# Patient Record
Sex: Female | Born: 1960 | Race: White | Hispanic: No | Marital: Single | Smoking: Current every day smoker
Health system: Southern US, Community
[De-identification: ages and names within clinical notes are randomized; demographics above are authoritative.]

## PROBLEM LIST (undated history)

## (undated) DIAGNOSIS — I10 Essential (primary) hypertension: Secondary | ICD-10-CM

## (undated) DIAGNOSIS — E785 Hyperlipidemia, unspecified: Secondary | ICD-10-CM

## (undated) DIAGNOSIS — F329 Major depressive disorder, single episode, unspecified: Secondary | ICD-10-CM

## (undated) DIAGNOSIS — F32A Depression, unspecified: Secondary | ICD-10-CM

## (undated) DIAGNOSIS — I639 Cerebral infarction, unspecified: Secondary | ICD-10-CM

## (undated) DIAGNOSIS — R011 Cardiac murmur, unspecified: Secondary | ICD-10-CM

## (undated) DIAGNOSIS — I219 Acute myocardial infarction, unspecified: Secondary | ICD-10-CM

## (undated) DIAGNOSIS — K219 Gastro-esophageal reflux disease without esophagitis: Secondary | ICD-10-CM

## (undated) HISTORY — DX: Essential (primary) hypertension: I10

## (undated) HISTORY — DX: Gastro-esophageal reflux disease without esophagitis: K21.9

## (undated) HISTORY — DX: Acute myocardial infarction, unspecified: I21.9

## (undated) HISTORY — DX: Major depressive disorder, single episode, unspecified: F32.9

## (undated) HISTORY — DX: Cerebral infarction, unspecified: I63.9

## (undated) HISTORY — PX: LAPAROSCOPIC CHOLECYSTECTOMY: SUR755

## (undated) HISTORY — DX: Depression, unspecified: F32.A

## (undated) HISTORY — DX: Cardiac murmur, unspecified: R01.1

## (undated) HISTORY — PX: CARDIAC SURGERY: SHX584

## (undated) HISTORY — DX: Hyperlipidemia, unspecified: E78.5

## (undated) HISTORY — PX: ABDOMINAL HYSTERECTOMY: SHX81

---

## 1997-11-18 ENCOUNTER — Other Ambulatory Visit: Admission: RE | Admit: 1997-11-18 | Discharge: 1997-11-18 | Payer: Self-pay | Admitting: Obstetrics & Gynecology

## 1999-07-27 ENCOUNTER — Encounter (INDEPENDENT_AMBULATORY_CARE_PROVIDER_SITE_OTHER): Payer: Self-pay

## 1999-07-27 ENCOUNTER — Encounter: Payer: Self-pay | Admitting: *Deleted

## 1999-07-27 ENCOUNTER — Inpatient Hospital Stay (HOSPITAL_COMMUNITY): Admission: AD | Admit: 1999-07-27 | Discharge: 1999-07-27 | Payer: Self-pay | Admitting: *Deleted

## 1999-07-30 ENCOUNTER — Encounter: Admission: RE | Admit: 1999-07-30 | Discharge: 1999-10-28 | Payer: Self-pay | Admitting: Obstetrics & Gynecology

## 1999-08-03 ENCOUNTER — Encounter (HOSPITAL_COMMUNITY): Admission: AD | Admit: 1999-08-03 | Discharge: 1999-11-01 | Payer: Self-pay | Admitting: *Deleted

## 1999-08-11 ENCOUNTER — Encounter: Admission: RE | Admit: 1999-08-11 | Discharge: 1999-08-11 | Payer: Self-pay | Admitting: Obstetrics & Gynecology

## 1999-08-14 ENCOUNTER — Inpatient Hospital Stay (HOSPITAL_COMMUNITY): Admission: AD | Admit: 1999-08-14 | Discharge: 1999-08-14 | Payer: Self-pay | Admitting: *Deleted

## 1999-08-18 ENCOUNTER — Encounter: Admission: RE | Admit: 1999-08-18 | Discharge: 1999-08-18 | Payer: Self-pay | Admitting: Obstetrics & Gynecology

## 1999-08-25 ENCOUNTER — Encounter: Admission: RE | Admit: 1999-08-25 | Discharge: 1999-08-25 | Payer: Self-pay | Admitting: Obstetrics & Gynecology

## 1999-09-08 ENCOUNTER — Encounter: Admission: RE | Admit: 1999-09-08 | Discharge: 1999-09-08 | Payer: Self-pay | Admitting: Obstetrics

## 1999-09-15 ENCOUNTER — Encounter: Admission: RE | Admit: 1999-09-15 | Discharge: 1999-09-15 | Payer: Self-pay | Admitting: Obstetrics & Gynecology

## 1999-09-21 ENCOUNTER — Ambulatory Visit (HOSPITAL_COMMUNITY): Admission: RE | Admit: 1999-09-21 | Discharge: 1999-09-21 | Payer: Self-pay | Admitting: Obstetrics

## 1999-09-22 ENCOUNTER — Encounter: Admission: RE | Admit: 1999-09-22 | Discharge: 1999-09-22 | Payer: Self-pay | Admitting: Obstetrics & Gynecology

## 1999-09-29 ENCOUNTER — Ambulatory Visit (HOSPITAL_COMMUNITY): Admission: RE | Admit: 1999-09-29 | Discharge: 1999-09-29 | Payer: Self-pay | Admitting: Internal Medicine

## 1999-09-29 ENCOUNTER — Encounter: Admission: RE | Admit: 1999-09-29 | Discharge: 1999-09-29 | Payer: Self-pay | Admitting: Obstetrics & Gynecology

## 1999-10-04 ENCOUNTER — Encounter: Payer: Self-pay | Admitting: *Deleted

## 1999-10-06 ENCOUNTER — Encounter: Admission: RE | Admit: 1999-10-06 | Discharge: 1999-10-06 | Payer: Self-pay | Admitting: Obstetrics & Gynecology

## 1999-10-13 ENCOUNTER — Encounter: Admission: RE | Admit: 1999-10-13 | Discharge: 1999-10-13 | Payer: Self-pay | Admitting: Obstetrics & Gynecology

## 1999-10-20 ENCOUNTER — Encounter: Admission: RE | Admit: 1999-10-20 | Discharge: 1999-10-20 | Payer: Self-pay | Admitting: Obstetrics & Gynecology

## 1999-10-27 ENCOUNTER — Encounter: Admission: RE | Admit: 1999-10-27 | Discharge: 1999-10-27 | Payer: Self-pay | Admitting: Obstetrics & Gynecology

## 1999-10-27 ENCOUNTER — Observation Stay (HOSPITAL_COMMUNITY): Admission: AD | Admit: 1999-10-27 | Discharge: 1999-10-28 | Payer: Self-pay | Admitting: Obstetrics

## 1999-10-28 ENCOUNTER — Encounter: Payer: Self-pay | Admitting: Obstetrics

## 1999-11-03 ENCOUNTER — Inpatient Hospital Stay (HOSPITAL_COMMUNITY): Admission: AD | Admit: 1999-11-03 | Discharge: 1999-11-03 | Payer: Self-pay | Admitting: Obstetrics

## 1999-11-03 ENCOUNTER — Encounter: Admission: RE | Admit: 1999-11-03 | Discharge: 1999-11-03 | Payer: Self-pay | Admitting: Obstetrics & Gynecology

## 1999-11-05 ENCOUNTER — Encounter (HOSPITAL_COMMUNITY): Admission: RE | Admit: 1999-11-05 | Discharge: 1999-12-10 | Payer: Self-pay | Admitting: Obstetrics

## 1999-11-10 ENCOUNTER — Encounter: Admission: RE | Admit: 1999-11-10 | Discharge: 1999-11-10 | Payer: Self-pay | Admitting: Obstetrics & Gynecology

## 1999-11-14 ENCOUNTER — Inpatient Hospital Stay (HOSPITAL_COMMUNITY): Admission: AD | Admit: 1999-11-14 | Discharge: 1999-11-14 | Payer: Self-pay | Admitting: Obstetrics

## 1999-11-17 ENCOUNTER — Encounter: Admission: RE | Admit: 1999-11-17 | Discharge: 1999-11-17 | Payer: Self-pay | Admitting: Obstetrics & Gynecology

## 1999-11-24 ENCOUNTER — Encounter: Admission: RE | Admit: 1999-11-24 | Discharge: 1999-11-24 | Payer: Self-pay | Admitting: Obstetrics & Gynecology

## 1999-12-01 ENCOUNTER — Inpatient Hospital Stay (HOSPITAL_COMMUNITY): Admission: AD | Admit: 1999-12-01 | Discharge: 1999-12-03 | Payer: Self-pay | Admitting: Obstetrics

## 1999-12-01 ENCOUNTER — Encounter: Admission: RE | Admit: 1999-12-01 | Discharge: 1999-12-01 | Payer: Self-pay | Admitting: Obstetrics & Gynecology

## 1999-12-01 ENCOUNTER — Encounter: Payer: Self-pay | Admitting: Obstetrics

## 1999-12-02 ENCOUNTER — Encounter: Payer: Self-pay | Admitting: Obstetrics

## 1999-12-03 ENCOUNTER — Encounter: Payer: Self-pay | Admitting: Obstetrics

## 1999-12-08 ENCOUNTER — Inpatient Hospital Stay (HOSPITAL_COMMUNITY): Admission: AD | Admit: 1999-12-08 | Discharge: 1999-12-15 | Payer: Self-pay | Admitting: Obstetrics

## 1999-12-08 ENCOUNTER — Encounter (INDEPENDENT_AMBULATORY_CARE_PROVIDER_SITE_OTHER): Payer: Self-pay

## 1999-12-08 ENCOUNTER — Encounter: Admission: RE | Admit: 1999-12-08 | Discharge: 1999-12-08 | Payer: Self-pay | Admitting: Obstetrics & Gynecology

## 1999-12-13 ENCOUNTER — Encounter: Payer: Self-pay | Admitting: Obstetrics

## 1999-12-14 ENCOUNTER — Encounter: Payer: Self-pay | Admitting: Obstetrics & Gynecology

## 2000-01-18 ENCOUNTER — Emergency Department (HOSPITAL_COMMUNITY): Admission: EM | Admit: 2000-01-18 | Discharge: 2000-01-18 | Payer: Self-pay | Admitting: Emergency Medicine

## 2000-03-10 ENCOUNTER — Encounter: Admission: RE | Admit: 2000-03-10 | Discharge: 2000-03-10 | Payer: Self-pay | Admitting: Internal Medicine

## 2000-05-25 ENCOUNTER — Emergency Department (HOSPITAL_COMMUNITY): Admission: EM | Admit: 2000-05-25 | Discharge: 2000-05-25 | Payer: Self-pay | Admitting: Emergency Medicine

## 2000-05-26 ENCOUNTER — Inpatient Hospital Stay (HOSPITAL_COMMUNITY): Admission: EM | Admit: 2000-05-26 | Discharge: 2000-05-29 | Payer: Self-pay | Admitting: Emergency Medicine

## 2000-05-26 ENCOUNTER — Encounter: Payer: Self-pay | Admitting: Emergency Medicine

## 2000-11-05 ENCOUNTER — Encounter: Payer: Self-pay | Admitting: Emergency Medicine

## 2000-11-06 ENCOUNTER — Encounter: Payer: Self-pay | Admitting: Gastroenterology

## 2000-11-06 ENCOUNTER — Encounter (INDEPENDENT_AMBULATORY_CARE_PROVIDER_SITE_OTHER): Payer: Self-pay

## 2000-11-06 ENCOUNTER — Inpatient Hospital Stay (HOSPITAL_COMMUNITY): Admission: EM | Admit: 2000-11-06 | Discharge: 2000-11-08 | Payer: Self-pay | Admitting: Emergency Medicine

## 2001-02-13 ENCOUNTER — Emergency Department (HOSPITAL_COMMUNITY): Admission: EM | Admit: 2001-02-13 | Discharge: 2001-02-14 | Payer: Self-pay | Admitting: *Deleted

## 2001-05-11 ENCOUNTER — Encounter: Payer: Self-pay | Admitting: *Deleted

## 2001-05-11 ENCOUNTER — Encounter: Admission: RE | Admit: 2001-05-11 | Discharge: 2001-05-11 | Payer: Self-pay | Admitting: *Deleted

## 2001-07-14 ENCOUNTER — Emergency Department (HOSPITAL_COMMUNITY): Admission: EM | Admit: 2001-07-14 | Discharge: 2001-07-14 | Payer: Self-pay | Admitting: Emergency Medicine

## 2001-07-18 ENCOUNTER — Ambulatory Visit (HOSPITAL_COMMUNITY): Admission: RE | Admit: 2001-07-18 | Discharge: 2001-07-18 | Payer: Self-pay | Admitting: Family Medicine

## 2001-07-18 ENCOUNTER — Encounter: Payer: Self-pay | Admitting: Family Medicine

## 2002-04-05 ENCOUNTER — Encounter: Admission: RE | Admit: 2002-04-05 | Discharge: 2002-04-05 | Payer: Self-pay | Admitting: Internal Medicine

## 2002-05-06 ENCOUNTER — Encounter: Admission: RE | Admit: 2002-05-06 | Discharge: 2002-05-06 | Payer: Self-pay | Admitting: Internal Medicine

## 2002-05-27 ENCOUNTER — Encounter: Admission: RE | Admit: 2002-05-27 | Discharge: 2002-05-27 | Payer: Self-pay | Admitting: Internal Medicine

## 2002-05-29 ENCOUNTER — Encounter: Admission: RE | Admit: 2002-05-29 | Discharge: 2002-05-29 | Payer: Self-pay | Admitting: Internal Medicine

## 2002-05-31 ENCOUNTER — Encounter: Admission: RE | Admit: 2002-05-31 | Discharge: 2002-05-31 | Payer: Self-pay | Admitting: Internal Medicine

## 2002-05-31 ENCOUNTER — Inpatient Hospital Stay (HOSPITAL_COMMUNITY): Admission: AD | Admit: 2002-05-31 | Discharge: 2002-06-05 | Payer: Self-pay | Admitting: Internal Medicine

## 2002-06-28 ENCOUNTER — Encounter: Admission: RE | Admit: 2002-06-28 | Discharge: 2002-06-28 | Payer: Self-pay | Admitting: Internal Medicine

## 2002-07-25 ENCOUNTER — Encounter: Admission: RE | Admit: 2002-07-25 | Discharge: 2002-07-25 | Payer: Self-pay | Admitting: Internal Medicine

## 2002-08-20 ENCOUNTER — Encounter: Admission: RE | Admit: 2002-08-20 | Discharge: 2002-08-20 | Payer: Self-pay | Admitting: Internal Medicine

## 2002-09-18 ENCOUNTER — Encounter: Admission: RE | Admit: 2002-09-18 | Discharge: 2002-09-18 | Payer: Self-pay | Admitting: Internal Medicine

## 2002-10-02 ENCOUNTER — Ambulatory Visit (HOSPITAL_COMMUNITY): Admission: RE | Admit: 2002-10-02 | Discharge: 2002-10-02 | Payer: Self-pay | Admitting: Internal Medicine

## 2002-10-28 ENCOUNTER — Ambulatory Visit (HOSPITAL_COMMUNITY): Admission: RE | Admit: 2002-10-28 | Discharge: 2002-10-28 | Payer: Self-pay | Admitting: Internal Medicine

## 2002-10-28 ENCOUNTER — Encounter: Payer: Self-pay | Admitting: Internal Medicine

## 2002-11-07 ENCOUNTER — Encounter: Admission: RE | Admit: 2002-11-07 | Discharge: 2002-11-07 | Payer: Self-pay | Admitting: Internal Medicine

## 2002-11-14 ENCOUNTER — Encounter: Admission: RE | Admit: 2002-11-14 | Discharge: 2002-11-14 | Payer: Self-pay | Admitting: Internal Medicine

## 2002-11-15 ENCOUNTER — Inpatient Hospital Stay (HOSPITAL_COMMUNITY): Admission: AD | Admit: 2002-11-15 | Discharge: 2002-11-17 | Payer: Self-pay | Admitting: Infectious Diseases

## 2002-11-21 ENCOUNTER — Encounter: Admission: RE | Admit: 2002-11-21 | Discharge: 2002-11-21 | Payer: Self-pay | Admitting: Internal Medicine

## 2002-12-03 ENCOUNTER — Encounter: Admission: RE | Admit: 2002-12-03 | Discharge: 2002-12-03 | Payer: Self-pay | Admitting: Internal Medicine

## 2002-12-05 ENCOUNTER — Other Ambulatory Visit: Admission: RE | Admit: 2002-12-05 | Discharge: 2002-12-05 | Payer: Self-pay | Admitting: Obstetrics and Gynecology

## 2002-12-05 ENCOUNTER — Encounter: Admission: RE | Admit: 2002-12-05 | Discharge: 2002-12-05 | Payer: Self-pay | Admitting: Obstetrics and Gynecology

## 2002-12-10 ENCOUNTER — Encounter: Admission: RE | Admit: 2002-12-10 | Discharge: 2002-12-10 | Payer: Self-pay | Admitting: Internal Medicine

## 2002-12-19 ENCOUNTER — Encounter: Admission: RE | Admit: 2002-12-19 | Discharge: 2002-12-19 | Payer: Self-pay | Admitting: Internal Medicine

## 2002-12-27 ENCOUNTER — Ambulatory Visit (HOSPITAL_COMMUNITY): Admission: RE | Admit: 2002-12-27 | Discharge: 2002-12-27 | Payer: Self-pay

## 2003-01-17 ENCOUNTER — Encounter: Payer: Self-pay | Admitting: Internal Medicine

## 2003-01-17 ENCOUNTER — Ambulatory Visit (HOSPITAL_COMMUNITY): Admission: RE | Admit: 2003-01-17 | Discharge: 2003-01-17 | Payer: Self-pay | Admitting: Internal Medicine

## 2003-01-24 ENCOUNTER — Encounter: Admission: RE | Admit: 2003-01-24 | Discharge: 2003-01-24 | Payer: Self-pay | Admitting: Internal Medicine

## 2003-02-11 ENCOUNTER — Emergency Department (HOSPITAL_COMMUNITY): Admission: EM | Admit: 2003-02-11 | Discharge: 2003-02-11 | Payer: Self-pay | Admitting: Emergency Medicine

## 2003-02-20 ENCOUNTER — Encounter: Admission: RE | Admit: 2003-02-20 | Discharge: 2003-02-20 | Payer: Self-pay | Admitting: Internal Medicine

## 2003-03-05 ENCOUNTER — Encounter: Admission: RE | Admit: 2003-03-05 | Discharge: 2003-03-05 | Payer: Self-pay | Admitting: Internal Medicine

## 2003-03-07 ENCOUNTER — Encounter: Payer: Self-pay | Admitting: Internal Medicine

## 2003-03-07 ENCOUNTER — Ambulatory Visit (HOSPITAL_COMMUNITY): Admission: RE | Admit: 2003-03-07 | Discharge: 2003-03-07 | Payer: Self-pay | Admitting: Internal Medicine

## 2003-03-10 ENCOUNTER — Encounter: Admission: RE | Admit: 2003-03-10 | Discharge: 2003-03-10 | Payer: Self-pay | Admitting: Internal Medicine

## 2003-03-18 ENCOUNTER — Encounter: Admission: RE | Admit: 2003-03-18 | Discharge: 2003-03-18 | Payer: Self-pay | Admitting: Internal Medicine

## 2003-04-17 ENCOUNTER — Encounter: Admission: RE | Admit: 2003-04-17 | Discharge: 2003-04-17 | Payer: Self-pay | Admitting: Internal Medicine

## 2003-05-06 ENCOUNTER — Encounter: Admission: RE | Admit: 2003-05-06 | Discharge: 2003-05-06 | Payer: Self-pay | Admitting: Internal Medicine

## 2003-05-23 ENCOUNTER — Emergency Department (HOSPITAL_COMMUNITY): Admission: EM | Admit: 2003-05-23 | Discharge: 2003-05-23 | Payer: Self-pay | Admitting: Emergency Medicine

## 2003-05-27 ENCOUNTER — Encounter: Admission: RE | Admit: 2003-05-27 | Discharge: 2003-05-27 | Payer: Self-pay | Admitting: Internal Medicine

## 2003-05-28 ENCOUNTER — Ambulatory Visit: Admission: RE | Admit: 2003-05-28 | Discharge: 2003-05-28 | Payer: Self-pay | Admitting: Internal Medicine

## 2003-05-28 ENCOUNTER — Encounter: Payer: Self-pay | Admitting: Internal Medicine

## 2003-05-28 ENCOUNTER — Encounter: Admission: RE | Admit: 2003-05-28 | Discharge: 2003-05-28 | Payer: Self-pay | Admitting: Internal Medicine

## 2003-05-29 ENCOUNTER — Encounter: Payer: Self-pay | Admitting: Internal Medicine

## 2003-05-29 ENCOUNTER — Inpatient Hospital Stay (HOSPITAL_COMMUNITY): Admission: AD | Admit: 2003-05-29 | Discharge: 2003-05-31 | Payer: Self-pay | Admitting: Internal Medicine

## 2003-05-29 ENCOUNTER — Encounter: Admission: RE | Admit: 2003-05-29 | Discharge: 2003-05-29 | Payer: Self-pay | Admitting: Internal Medicine

## 2003-06-11 ENCOUNTER — Encounter: Admission: RE | Admit: 2003-06-11 | Discharge: 2003-06-11 | Payer: Self-pay | Admitting: Internal Medicine

## 2003-06-17 ENCOUNTER — Encounter (INDEPENDENT_AMBULATORY_CARE_PROVIDER_SITE_OTHER): Payer: Self-pay

## 2003-06-17 ENCOUNTER — Other Ambulatory Visit: Admission: RE | Admit: 2003-06-17 | Discharge: 2003-06-17 | Payer: Self-pay | Admitting: Obstetrics & Gynecology

## 2003-06-17 ENCOUNTER — Encounter: Admission: RE | Admit: 2003-06-17 | Discharge: 2003-06-17 | Payer: Self-pay | Admitting: Obstetrics and Gynecology

## 2003-06-24 ENCOUNTER — Encounter: Admission: RE | Admit: 2003-06-24 | Discharge: 2003-06-24 | Payer: Self-pay | Admitting: Obstetrics and Gynecology

## 2003-07-01 ENCOUNTER — Ambulatory Visit (HOSPITAL_COMMUNITY): Admission: RE | Admit: 2003-07-01 | Discharge: 2003-07-01 | Payer: Self-pay | Admitting: *Deleted

## 2003-07-01 ENCOUNTER — Encounter: Payer: Self-pay | Admitting: *Deleted

## 2003-07-01 ENCOUNTER — Encounter: Admission: RE | Admit: 2003-07-01 | Discharge: 2003-07-01 | Payer: Self-pay | Admitting: Obstetrics and Gynecology

## 2003-07-29 ENCOUNTER — Encounter: Admission: RE | Admit: 2003-07-29 | Discharge: 2003-07-29 | Payer: Self-pay | Admitting: Obstetrics and Gynecology

## 2003-08-18 ENCOUNTER — Encounter: Admission: RE | Admit: 2003-08-18 | Discharge: 2003-08-18 | Payer: Self-pay | Admitting: Internal Medicine

## 2003-08-28 ENCOUNTER — Ambulatory Visit (HOSPITAL_COMMUNITY): Admission: RE | Admit: 2003-08-28 | Discharge: 2003-08-28 | Payer: Self-pay | Admitting: Obstetrics and Gynecology

## 2003-09-16 ENCOUNTER — Encounter: Admission: RE | Admit: 2003-09-16 | Discharge: 2003-09-16 | Payer: Self-pay | Admitting: Internal Medicine

## 2003-10-01 ENCOUNTER — Ambulatory Visit: Admission: RE | Admit: 2003-10-01 | Discharge: 2003-10-01 | Payer: Self-pay | Admitting: Gynecologic Oncology

## 2003-10-13 ENCOUNTER — Encounter: Admission: RE | Admit: 2003-10-13 | Discharge: 2003-10-13 | Payer: Self-pay | Admitting: Internal Medicine

## 2003-10-15 ENCOUNTER — Encounter (INDEPENDENT_AMBULATORY_CARE_PROVIDER_SITE_OTHER): Payer: Self-pay | Admitting: Specialist

## 2003-10-15 ENCOUNTER — Inpatient Hospital Stay (HOSPITAL_COMMUNITY): Admission: RE | Admit: 2003-10-15 | Discharge: 2003-10-19 | Payer: Self-pay | Admitting: Obstetrics & Gynecology

## 2003-10-21 ENCOUNTER — Encounter: Admission: RE | Admit: 2003-10-21 | Discharge: 2003-10-21 | Payer: Self-pay | Admitting: Obstetrics and Gynecology

## 2003-11-07 ENCOUNTER — Encounter: Admission: RE | Admit: 2003-11-07 | Discharge: 2003-11-07 | Payer: Self-pay | Admitting: Internal Medicine

## 2003-11-18 ENCOUNTER — Encounter: Admission: RE | Admit: 2003-11-18 | Discharge: 2003-11-18 | Payer: Self-pay | Admitting: Obstetrics and Gynecology

## 2004-01-26 ENCOUNTER — Encounter: Admission: RE | Admit: 2004-01-26 | Discharge: 2004-01-26 | Payer: Self-pay | Admitting: Internal Medicine

## 2004-02-19 ENCOUNTER — Encounter: Admission: RE | Admit: 2004-02-19 | Discharge: 2004-02-19 | Payer: Self-pay | Admitting: Internal Medicine

## 2004-02-24 ENCOUNTER — Encounter: Admission: RE | Admit: 2004-02-24 | Discharge: 2004-02-24 | Payer: Self-pay | Admitting: Obstetrics and Gynecology

## 2004-03-23 ENCOUNTER — Encounter: Admission: RE | Admit: 2004-03-23 | Discharge: 2004-03-23 | Payer: Self-pay | Admitting: Internal Medicine

## 2004-04-05 ENCOUNTER — Encounter: Admission: RE | Admit: 2004-04-05 | Discharge: 2004-04-05 | Payer: Self-pay | Admitting: Internal Medicine

## 2004-04-27 ENCOUNTER — Encounter: Admission: RE | Admit: 2004-04-27 | Discharge: 2004-04-27 | Payer: Self-pay | Admitting: Internal Medicine

## 2004-05-31 ENCOUNTER — Ambulatory Visit: Payer: Self-pay | Admitting: Internal Medicine

## 2004-06-24 ENCOUNTER — Ambulatory Visit: Payer: Self-pay | Admitting: Internal Medicine

## 2004-08-03 ENCOUNTER — Ambulatory Visit: Payer: Self-pay | Admitting: Obstetrics & Gynecology

## 2004-08-10 ENCOUNTER — Encounter: Admission: RE | Admit: 2004-08-10 | Discharge: 2004-08-10 | Payer: Self-pay | Admitting: Obstetrics & Gynecology

## 2004-08-10 ENCOUNTER — Ambulatory Visit: Payer: Self-pay | Admitting: Internal Medicine

## 2004-12-01 ENCOUNTER — Encounter: Admission: RE | Admit: 2004-12-01 | Discharge: 2004-12-01 | Payer: Self-pay | Admitting: Family Medicine

## 2004-12-31 ENCOUNTER — Encounter: Admission: RE | Admit: 2004-12-31 | Discharge: 2004-12-31 | Payer: Self-pay | Admitting: Family Medicine

## 2005-05-10 ENCOUNTER — Ambulatory Visit: Payer: Self-pay | Admitting: *Deleted

## 2005-05-31 ENCOUNTER — Ambulatory Visit: Payer: Self-pay | Admitting: Obstetrics & Gynecology

## 2005-05-31 ENCOUNTER — Encounter: Admission: RE | Admit: 2005-05-31 | Discharge: 2005-05-31 | Payer: Self-pay

## 2005-07-04 ENCOUNTER — Encounter: Admission: RE | Admit: 2005-07-04 | Discharge: 2005-07-04 | Payer: Self-pay

## 2005-07-05 ENCOUNTER — Ambulatory Visit (HOSPITAL_BASED_OUTPATIENT_CLINIC_OR_DEPARTMENT_OTHER): Admission: RE | Admit: 2005-07-05 | Discharge: 2005-07-05 | Payer: Self-pay

## 2005-07-05 ENCOUNTER — Encounter (INDEPENDENT_AMBULATORY_CARE_PROVIDER_SITE_OTHER): Payer: Self-pay | Admitting: Specialist

## 2005-07-05 ENCOUNTER — Ambulatory Visit (HOSPITAL_COMMUNITY): Admission: RE | Admit: 2005-07-05 | Discharge: 2005-07-05 | Payer: Self-pay

## 2005-08-11 ENCOUNTER — Ambulatory Visit (HOSPITAL_COMMUNITY): Admission: RE | Admit: 2005-08-11 | Discharge: 2005-08-12 | Payer: Self-pay | Admitting: Cardiovascular Disease

## 2005-11-24 ENCOUNTER — Ambulatory Visit (HOSPITAL_COMMUNITY): Admission: RE | Admit: 2005-11-24 | Discharge: 2005-11-25 | Payer: Self-pay | Admitting: Physician Assistant

## 2005-11-30 ENCOUNTER — Encounter
Admission: RE | Admit: 2005-11-30 | Discharge: 2006-02-28 | Payer: Self-pay | Admitting: Physical Medicine & Rehabilitation

## 2005-11-30 ENCOUNTER — Ambulatory Visit: Payer: Self-pay | Admitting: Physical Medicine & Rehabilitation

## 2005-12-11 ENCOUNTER — Ambulatory Visit (HOSPITAL_BASED_OUTPATIENT_CLINIC_OR_DEPARTMENT_OTHER): Admission: RE | Admit: 2005-12-11 | Discharge: 2005-12-11 | Payer: Self-pay | Admitting: Cardiovascular Disease

## 2005-12-18 ENCOUNTER — Ambulatory Visit: Payer: Self-pay | Admitting: Internal Medicine

## 2005-12-23 ENCOUNTER — Emergency Department (HOSPITAL_COMMUNITY): Admission: EM | Admit: 2005-12-23 | Discharge: 2005-12-24 | Payer: Self-pay | Admitting: Emergency Medicine

## 2005-12-31 ENCOUNTER — Emergency Department (HOSPITAL_COMMUNITY): Admission: EM | Admit: 2005-12-31 | Discharge: 2006-01-01 | Payer: Self-pay | Admitting: Emergency Medicine

## 2006-01-10 ENCOUNTER — Ambulatory Visit: Payer: Self-pay | Admitting: Internal Medicine

## 2006-04-19 ENCOUNTER — Ambulatory Visit: Payer: Self-pay | Admitting: Obstetrics & Gynecology

## 2006-04-25 ENCOUNTER — Inpatient Hospital Stay (HOSPITAL_COMMUNITY): Admission: EM | Admit: 2006-04-25 | Discharge: 2006-04-29 | Payer: Self-pay | Admitting: Emergency Medicine

## 2006-04-27 ENCOUNTER — Ambulatory Visit: Payer: Self-pay | Admitting: Dentistry

## 2006-08-20 ENCOUNTER — Emergency Department (HOSPITAL_COMMUNITY): Admission: EM | Admit: 2006-08-20 | Discharge: 2006-08-20 | Payer: Self-pay | Admitting: Emergency Medicine

## 2006-08-23 ENCOUNTER — Inpatient Hospital Stay (HOSPITAL_COMMUNITY): Admission: EM | Admit: 2006-08-23 | Discharge: 2006-08-28 | Payer: Self-pay | Admitting: Emergency Medicine

## 2006-08-23 ENCOUNTER — Ambulatory Visit: Payer: Self-pay | Admitting: Family Medicine

## 2006-08-25 ENCOUNTER — Encounter: Payer: Self-pay | Admitting: Vascular Surgery

## 2006-09-10 ENCOUNTER — Inpatient Hospital Stay (HOSPITAL_COMMUNITY): Admission: EM | Admit: 2006-09-10 | Discharge: 2006-09-14 | Payer: Self-pay | Admitting: Emergency Medicine

## 2006-09-26 ENCOUNTER — Ambulatory Visit: Payer: Self-pay | Admitting: Family Medicine

## 2006-10-03 ENCOUNTER — Ambulatory Visit: Payer: Self-pay | Admitting: Family Medicine

## 2006-11-03 ENCOUNTER — Ambulatory Visit: Payer: Self-pay | Admitting: Family Medicine

## 2006-11-13 ENCOUNTER — Ambulatory Visit: Payer: Self-pay | Admitting: Family Medicine

## 2006-11-13 ENCOUNTER — Encounter (INDEPENDENT_AMBULATORY_CARE_PROVIDER_SITE_OTHER): Payer: Self-pay | Admitting: Family Medicine

## 2006-11-13 DIAGNOSIS — E785 Hyperlipidemia, unspecified: Secondary | ICD-10-CM

## 2006-11-13 DIAGNOSIS — I2510 Atherosclerotic heart disease of native coronary artery without angina pectoris: Secondary | ICD-10-CM | POA: Insufficient documentation

## 2006-11-13 DIAGNOSIS — I10 Essential (primary) hypertension: Secondary | ICD-10-CM | POA: Insufficient documentation

## 2006-11-13 DIAGNOSIS — I739 Peripheral vascular disease, unspecified: Secondary | ICD-10-CM

## 2006-11-13 LAB — CONVERTED CEMR LAB
BUN: 14 mg/dL (ref 6–23)
CO2: 22 meq/L (ref 19–32)
Calcium: 9.7 mg/dL (ref 8.4–10.5)
Chloride: 101 meq/L (ref 96–112)
Creatinine, Ser: 0.91 mg/dL (ref 0.40–1.20)

## 2006-11-14 ENCOUNTER — Ambulatory Visit (HOSPITAL_COMMUNITY): Admission: RE | Admit: 2006-11-14 | Discharge: 2006-11-14 | Payer: Self-pay | Admitting: Unknown Physician Specialty

## 2006-11-15 ENCOUNTER — Telehealth (INDEPENDENT_AMBULATORY_CARE_PROVIDER_SITE_OTHER): Payer: Self-pay | Admitting: Family Medicine

## 2006-11-15 ENCOUNTER — Telehealth: Payer: Self-pay | Admitting: *Deleted

## 2006-11-16 ENCOUNTER — Encounter (INDEPENDENT_AMBULATORY_CARE_PROVIDER_SITE_OTHER): Payer: Self-pay | Admitting: Family Medicine

## 2006-11-16 ENCOUNTER — Ambulatory Visit: Payer: Self-pay | Admitting: Sports Medicine

## 2006-11-20 ENCOUNTER — Encounter (INDEPENDENT_AMBULATORY_CARE_PROVIDER_SITE_OTHER): Payer: Self-pay | Admitting: Family Medicine

## 2006-11-20 LAB — CONVERTED CEMR LAB: Triglycerides: 913 mg/dL — ABNORMAL HIGH (ref <150)

## 2006-11-22 ENCOUNTER — Telehealth: Payer: Self-pay | Admitting: *Deleted

## 2006-11-23 ENCOUNTER — Ambulatory Visit: Payer: Self-pay | Admitting: Family Medicine

## 2006-11-23 ENCOUNTER — Encounter (INDEPENDENT_AMBULATORY_CARE_PROVIDER_SITE_OTHER): Payer: Self-pay | Admitting: Family Medicine

## 2006-11-23 LAB — CONVERTED CEMR LAB: Lipase: 45 units/L (ref 0–75)

## 2006-11-27 ENCOUNTER — Ambulatory Visit: Payer: Self-pay | Admitting: Family Medicine

## 2006-11-30 ENCOUNTER — Telehealth (INDEPENDENT_AMBULATORY_CARE_PROVIDER_SITE_OTHER): Payer: Self-pay | Admitting: *Deleted

## 2006-12-04 ENCOUNTER — Ambulatory Visit: Payer: Self-pay | Admitting: Family Medicine

## 2006-12-04 ENCOUNTER — Encounter: Payer: Self-pay | Admitting: Family Medicine

## 2006-12-04 ENCOUNTER — Telehealth: Payer: Self-pay | Admitting: Family Medicine

## 2006-12-04 LAB — CONVERTED CEMR LAB
AST: 7 units/L (ref 0–37)
Albumin: 4.4 g/dL (ref 3.5–5.2)
BUN: 18 mg/dL (ref 6–23)
Bilirubin Urine: NEGATIVE
Blood in Urine, dipstick: NEGATIVE
CO2: 22 meq/L (ref 19–32)
Calcium: 9.9 mg/dL (ref 8.4–10.5)
Chloride: 99 meq/L (ref 96–112)
Glucose, Bld: 408 mg/dL — ABNORMAL HIGH (ref 70–99)
Ketones, urine, test strip: NEGATIVE
Lymphocytes Relative: 30 % (ref 12–46)
Lymphs Abs: 2.5 10*3/uL (ref 0.7–3.3)
Monocytes Relative: 4 % (ref 3–11)
Neutro Abs: 5.5 10*3/uL (ref 1.7–7.7)
Neutrophils Relative %: 64 % (ref 43–77)
Potassium: 5.1 meq/L (ref 3.5–5.3)
RBC: 5.14 M/uL — ABNORMAL HIGH (ref 3.87–5.11)
Specific Gravity, Urine: 1.01
WBC: 8.6 10*3/uL (ref 4.0–10.5)

## 2006-12-05 ENCOUNTER — Telehealth: Payer: Self-pay | Admitting: Family Medicine

## 2006-12-14 ENCOUNTER — Telehealth (INDEPENDENT_AMBULATORY_CARE_PROVIDER_SITE_OTHER): Payer: Self-pay | Admitting: *Deleted

## 2006-12-21 ENCOUNTER — Telehealth (INDEPENDENT_AMBULATORY_CARE_PROVIDER_SITE_OTHER): Payer: Self-pay | Admitting: *Deleted

## 2006-12-26 ENCOUNTER — Telehealth: Payer: Self-pay | Admitting: *Deleted

## 2006-12-29 ENCOUNTER — Encounter (INDEPENDENT_AMBULATORY_CARE_PROVIDER_SITE_OTHER): Payer: Self-pay | Admitting: Family Medicine

## 2006-12-31 ENCOUNTER — Encounter (INDEPENDENT_AMBULATORY_CARE_PROVIDER_SITE_OTHER): Payer: Self-pay | Admitting: Family Medicine

## 2007-01-08 ENCOUNTER — Telehealth (INDEPENDENT_AMBULATORY_CARE_PROVIDER_SITE_OTHER): Payer: Self-pay | Admitting: *Deleted

## 2007-01-24 ENCOUNTER — Ambulatory Visit: Payer: Self-pay | Admitting: Family Medicine

## 2007-01-24 ENCOUNTER — Encounter (INDEPENDENT_AMBULATORY_CARE_PROVIDER_SITE_OTHER): Payer: Self-pay | Admitting: Family Medicine

## 2007-01-24 DIAGNOSIS — E1149 Type 2 diabetes mellitus with other diabetic neurological complication: Secondary | ICD-10-CM

## 2007-01-24 LAB — CONVERTED CEMR LAB: Direct LDL: 248 mg/dL — ABNORMAL HIGH

## 2007-02-07 ENCOUNTER — Encounter (INDEPENDENT_AMBULATORY_CARE_PROVIDER_SITE_OTHER): Payer: Self-pay | Admitting: Family Medicine

## 2007-02-07 ENCOUNTER — Telehealth (INDEPENDENT_AMBULATORY_CARE_PROVIDER_SITE_OTHER): Payer: Self-pay | Admitting: Family Medicine

## 2007-02-22 ENCOUNTER — Telehealth: Payer: Self-pay | Admitting: *Deleted

## 2007-02-22 ENCOUNTER — Encounter (INDEPENDENT_AMBULATORY_CARE_PROVIDER_SITE_OTHER): Payer: Self-pay | Admitting: Family Medicine

## 2007-02-27 ENCOUNTER — Ambulatory Visit: Payer: Self-pay | Admitting: Family Medicine

## 2007-02-27 LAB — CONVERTED CEMR LAB
Blood in Urine, dipstick: NEGATIVE
Nitrite: NEGATIVE
Specific Gravity, Urine: 1.02

## 2007-03-06 ENCOUNTER — Encounter (INDEPENDENT_AMBULATORY_CARE_PROVIDER_SITE_OTHER): Payer: Self-pay | Admitting: Family Medicine

## 2007-05-16 ENCOUNTER — Encounter (INDEPENDENT_AMBULATORY_CARE_PROVIDER_SITE_OTHER): Payer: Self-pay | Admitting: Family Medicine

## 2007-05-16 ENCOUNTER — Ambulatory Visit: Payer: Self-pay | Admitting: Family Medicine

## 2007-05-16 LAB — CONVERTED CEMR LAB: Hgb A1c MFr Bld: 11.4 %

## 2007-05-22 ENCOUNTER — Encounter (INDEPENDENT_AMBULATORY_CARE_PROVIDER_SITE_OTHER): Payer: Self-pay | Admitting: Family Medicine

## 2007-05-30 ENCOUNTER — Encounter (INDEPENDENT_AMBULATORY_CARE_PROVIDER_SITE_OTHER): Payer: Self-pay | Admitting: Family Medicine

## 2007-06-01 ENCOUNTER — Telehealth: Payer: Self-pay | Admitting: *Deleted

## 2007-06-04 ENCOUNTER — Telehealth: Payer: Self-pay | Admitting: *Deleted

## 2007-06-06 ENCOUNTER — Ambulatory Visit: Payer: Self-pay | Admitting: Family Medicine

## 2007-06-26 ENCOUNTER — Telehealth (INDEPENDENT_AMBULATORY_CARE_PROVIDER_SITE_OTHER): Payer: Self-pay | Admitting: Family Medicine

## 2007-06-27 ENCOUNTER — Inpatient Hospital Stay (HOSPITAL_COMMUNITY): Admission: RE | Admit: 2007-06-27 | Discharge: 2007-06-28 | Payer: Self-pay | Admitting: Cardiovascular Disease

## 2007-07-02 ENCOUNTER — Ambulatory Visit: Payer: Self-pay | Admitting: Family Medicine

## 2007-07-02 DIAGNOSIS — F17200 Nicotine dependence, unspecified, uncomplicated: Secondary | ICD-10-CM

## 2007-07-04 ENCOUNTER — Encounter (INDEPENDENT_AMBULATORY_CARE_PROVIDER_SITE_OTHER): Payer: Self-pay | Admitting: Family Medicine

## 2007-07-11 ENCOUNTER — Encounter (INDEPENDENT_AMBULATORY_CARE_PROVIDER_SITE_OTHER): Payer: Self-pay | Admitting: Family Medicine

## 2007-07-20 ENCOUNTER — Inpatient Hospital Stay (HOSPITAL_COMMUNITY): Admission: EM | Admit: 2007-07-20 | Discharge: 2007-07-25 | Payer: Self-pay | Admitting: Emergency Medicine

## 2007-07-21 ENCOUNTER — Ambulatory Visit (HOSPITAL_COMMUNITY): Admission: RE | Admit: 2007-07-21 | Discharge: 2007-07-21 | Payer: Self-pay | Admitting: Cardiovascular Disease

## 2007-08-02 ENCOUNTER — Ambulatory Visit: Payer: Self-pay | Admitting: Family Medicine

## 2007-08-02 ENCOUNTER — Encounter (INDEPENDENT_AMBULATORY_CARE_PROVIDER_SITE_OTHER): Payer: Self-pay | Admitting: Family Medicine

## 2007-08-02 DIAGNOSIS — E669 Obesity, unspecified: Secondary | ICD-10-CM

## 2007-08-15 ENCOUNTER — Encounter: Payer: Self-pay | Admitting: *Deleted

## 2007-08-15 ENCOUNTER — Telehealth: Payer: Self-pay | Admitting: *Deleted

## 2007-08-22 ENCOUNTER — Encounter (INDEPENDENT_AMBULATORY_CARE_PROVIDER_SITE_OTHER): Payer: Self-pay | Admitting: Family Medicine

## 2007-08-31 ENCOUNTER — Encounter (INDEPENDENT_AMBULATORY_CARE_PROVIDER_SITE_OTHER): Payer: Self-pay | Admitting: Family Medicine

## 2007-09-03 ENCOUNTER — Encounter (INDEPENDENT_AMBULATORY_CARE_PROVIDER_SITE_OTHER): Payer: Self-pay | Admitting: Family Medicine

## 2007-09-17 ENCOUNTER — Ambulatory Visit: Payer: Self-pay | Admitting: Surgery

## 2007-09-21 ENCOUNTER — Telehealth (INDEPENDENT_AMBULATORY_CARE_PROVIDER_SITE_OTHER): Payer: Self-pay | Admitting: Family Medicine

## 2007-09-24 ENCOUNTER — Ambulatory Visit: Payer: Self-pay | Admitting: Thoracic Surgery (Cardiothoracic Vascular Surgery)

## 2007-09-30 ENCOUNTER — Inpatient Hospital Stay (HOSPITAL_COMMUNITY): Admission: AD | Admit: 2007-09-30 | Discharge: 2007-10-11 | Payer: Self-pay | Admitting: Cardiovascular Disease

## 2007-10-08 ENCOUNTER — Ambulatory Visit: Payer: Self-pay | Admitting: Thoracic Surgery (Cardiothoracic Vascular Surgery)

## 2007-10-31 ENCOUNTER — Ambulatory Visit: Payer: Self-pay | Admitting: Thoracic Surgery (Cardiothoracic Vascular Surgery)

## 2007-10-31 ENCOUNTER — Encounter
Admission: RE | Admit: 2007-10-31 | Discharge: 2007-10-31 | Payer: Self-pay | Admitting: Thoracic Surgery (Cardiothoracic Vascular Surgery)

## 2007-12-08 ENCOUNTER — Inpatient Hospital Stay (HOSPITAL_COMMUNITY): Admission: EM | Admit: 2007-12-08 | Discharge: 2007-12-09 | Payer: Self-pay | Admitting: Emergency Medicine

## 2007-12-12 ENCOUNTER — Ambulatory Visit: Payer: Self-pay | Admitting: Family Medicine

## 2007-12-12 ENCOUNTER — Encounter (INDEPENDENT_AMBULATORY_CARE_PROVIDER_SITE_OTHER): Payer: Self-pay | Admitting: Family Medicine

## 2007-12-12 LAB — CONVERTED CEMR LAB: Hgb A1c MFr Bld: 8.1 %

## 2007-12-17 ENCOUNTER — Encounter: Admission: RE | Admit: 2007-12-17 | Discharge: 2007-12-17 | Payer: Self-pay | Admitting: Family Medicine

## 2007-12-17 ENCOUNTER — Emergency Department (HOSPITAL_COMMUNITY): Admission: EM | Admit: 2007-12-17 | Discharge: 2007-12-17 | Payer: Self-pay | Admitting: Family Medicine

## 2007-12-17 ENCOUNTER — Telehealth: Payer: Self-pay | Admitting: *Deleted

## 2007-12-19 ENCOUNTER — Telehealth: Payer: Self-pay | Admitting: *Deleted

## 2008-01-01 ENCOUNTER — Encounter: Payer: Self-pay | Admitting: *Deleted

## 2008-01-07 ENCOUNTER — Telehealth: Payer: Self-pay | Admitting: *Deleted

## 2008-01-08 ENCOUNTER — Encounter (INDEPENDENT_AMBULATORY_CARE_PROVIDER_SITE_OTHER): Payer: Self-pay | Admitting: Family Medicine

## 2008-01-22 ENCOUNTER — Encounter (INDEPENDENT_AMBULATORY_CARE_PROVIDER_SITE_OTHER): Payer: Self-pay | Admitting: Family Medicine

## 2008-01-24 ENCOUNTER — Telehealth: Payer: Self-pay | Admitting: *Deleted

## 2008-01-25 ENCOUNTER — Ambulatory Visit: Payer: Self-pay | Admitting: Family Medicine

## 2008-01-25 ENCOUNTER — Encounter (INDEPENDENT_AMBULATORY_CARE_PROVIDER_SITE_OTHER): Payer: Self-pay | Admitting: Family Medicine

## 2008-01-25 ENCOUNTER — Encounter: Admission: RE | Admit: 2008-01-25 | Discharge: 2008-01-25 | Payer: Self-pay | Admitting: Cardiovascular Disease

## 2008-01-31 ENCOUNTER — Telehealth: Payer: Self-pay | Admitting: *Deleted

## 2008-01-31 ENCOUNTER — Encounter (INDEPENDENT_AMBULATORY_CARE_PROVIDER_SITE_OTHER): Payer: Self-pay | Admitting: Family Medicine

## 2008-02-15 ENCOUNTER — Telehealth: Payer: Self-pay | Admitting: *Deleted

## 2008-02-28 ENCOUNTER — Telehealth (INDEPENDENT_AMBULATORY_CARE_PROVIDER_SITE_OTHER): Payer: Self-pay | Admitting: Family Medicine

## 2008-02-28 ENCOUNTER — Encounter (INDEPENDENT_AMBULATORY_CARE_PROVIDER_SITE_OTHER): Payer: Self-pay | Admitting: Family Medicine

## 2008-03-02 ENCOUNTER — Emergency Department (HOSPITAL_COMMUNITY): Admission: EM | Admit: 2008-03-02 | Discharge: 2008-03-03 | Payer: Self-pay | Admitting: Emergency Medicine

## 2008-03-05 ENCOUNTER — Ambulatory Visit: Payer: Self-pay | Admitting: Family Medicine

## 2008-03-21 ENCOUNTER — Encounter
Admission: RE | Admit: 2008-03-21 | Discharge: 2008-05-21 | Payer: Self-pay | Admitting: Physical Medicine & Rehabilitation

## 2008-03-24 ENCOUNTER — Ambulatory Visit: Payer: Self-pay | Admitting: Physical Medicine & Rehabilitation

## 2008-03-24 ENCOUNTER — Encounter (INDEPENDENT_AMBULATORY_CARE_PROVIDER_SITE_OTHER): Payer: Self-pay | Admitting: Family Medicine

## 2008-03-24 DIAGNOSIS — R51 Headache: Secondary | ICD-10-CM

## 2008-03-24 DIAGNOSIS — R519 Headache, unspecified: Secondary | ICD-10-CM | POA: Insufficient documentation

## 2008-03-27 ENCOUNTER — Telehealth (INDEPENDENT_AMBULATORY_CARE_PROVIDER_SITE_OTHER): Payer: Self-pay | Admitting: Family Medicine

## 2008-04-14 ENCOUNTER — Ambulatory Visit: Payer: Self-pay | Admitting: Family Medicine

## 2008-04-14 ENCOUNTER — Inpatient Hospital Stay (HOSPITAL_COMMUNITY): Admission: EM | Admit: 2008-04-14 | Discharge: 2008-04-15 | Payer: Self-pay | Admitting: Emergency Medicine

## 2008-04-14 ENCOUNTER — Telehealth: Payer: Self-pay | Admitting: *Deleted

## 2008-04-15 ENCOUNTER — Encounter (INDEPENDENT_AMBULATORY_CARE_PROVIDER_SITE_OTHER): Payer: Self-pay | Admitting: Family Medicine

## 2008-04-15 LAB — CONVERTED CEMR LAB: HDL: 27 mg/dL

## 2008-04-17 ENCOUNTER — Encounter: Admission: RE | Admit: 2008-04-17 | Discharge: 2008-04-17 | Payer: Self-pay | Admitting: Surgery

## 2008-04-17 ENCOUNTER — Ambulatory Visit: Payer: Self-pay | Admitting: Family Medicine

## 2008-04-21 ENCOUNTER — Ambulatory Visit: Payer: Self-pay | Admitting: Physical Medicine & Rehabilitation

## 2008-04-24 ENCOUNTER — Telehealth: Payer: Self-pay | Admitting: *Deleted

## 2008-04-28 ENCOUNTER — Encounter (INDEPENDENT_AMBULATORY_CARE_PROVIDER_SITE_OTHER): Payer: Self-pay | Admitting: Family Medicine

## 2008-04-29 ENCOUNTER — Ambulatory Visit: Payer: Self-pay | Admitting: Family Medicine

## 2008-05-08 ENCOUNTER — Telehealth: Payer: Self-pay | Admitting: *Deleted

## 2008-05-13 ENCOUNTER — Telehealth: Payer: Self-pay | Admitting: *Deleted

## 2008-05-21 ENCOUNTER — Ambulatory Visit: Payer: Self-pay | Admitting: Physical Medicine & Rehabilitation

## 2008-06-18 ENCOUNTER — Encounter (INDEPENDENT_AMBULATORY_CARE_PROVIDER_SITE_OTHER): Payer: Self-pay | Admitting: Family Medicine

## 2008-06-19 ENCOUNTER — Encounter
Admission: RE | Admit: 2008-06-19 | Discharge: 2008-06-19 | Payer: Self-pay | Admitting: Physical Medicine & Rehabilitation

## 2008-07-01 ENCOUNTER — Inpatient Hospital Stay (HOSPITAL_COMMUNITY): Admission: RE | Admit: 2008-07-01 | Discharge: 2008-07-02 | Payer: Self-pay | Admitting: Cardiovascular Disease

## 2008-08-15 ENCOUNTER — Encounter (INDEPENDENT_AMBULATORY_CARE_PROVIDER_SITE_OTHER): Payer: Self-pay | Admitting: *Deleted

## 2008-08-18 ENCOUNTER — Encounter (INDEPENDENT_AMBULATORY_CARE_PROVIDER_SITE_OTHER): Payer: Self-pay | Admitting: Family Medicine

## 2008-08-25 ENCOUNTER — Encounter (INDEPENDENT_AMBULATORY_CARE_PROVIDER_SITE_OTHER): Payer: Self-pay | Admitting: Family Medicine

## 2008-08-25 LAB — CONVERTED CEMR LAB
Cholesterol: 223 mg/dL
HDL: 27 mg/dL
LDL Cholesterol: UNDETERMINED mg/dL
Triglycerides: 498 mg/dL

## 2008-09-08 ENCOUNTER — Ambulatory Visit: Payer: Self-pay | Admitting: Family Medicine

## 2008-09-08 ENCOUNTER — Encounter: Admission: RE | Admit: 2008-09-08 | Discharge: 2008-09-08 | Payer: Self-pay | Admitting: Sports Medicine

## 2008-09-08 ENCOUNTER — Telehealth: Payer: Self-pay | Admitting: *Deleted

## 2008-09-15 ENCOUNTER — Ambulatory Visit: Payer: Self-pay | Admitting: Family Medicine

## 2008-09-16 ENCOUNTER — Telehealth: Payer: Self-pay | Admitting: *Deleted

## 2008-09-30 ENCOUNTER — Telehealth (INDEPENDENT_AMBULATORY_CARE_PROVIDER_SITE_OTHER): Payer: Self-pay | Admitting: Family Medicine

## 2008-09-30 ENCOUNTER — Encounter (INDEPENDENT_AMBULATORY_CARE_PROVIDER_SITE_OTHER): Payer: Self-pay | Admitting: Family Medicine

## 2008-09-30 DIAGNOSIS — F1910 Other psychoactive substance abuse, uncomplicated: Secondary | ICD-10-CM | POA: Insufficient documentation

## 2008-10-06 ENCOUNTER — Encounter (INDEPENDENT_AMBULATORY_CARE_PROVIDER_SITE_OTHER): Payer: Self-pay | Admitting: Family Medicine

## 2008-10-06 ENCOUNTER — Ambulatory Visit: Payer: Self-pay | Admitting: Family Medicine

## 2008-10-06 LAB — CONVERTED CEMR LAB
Glucose, Urine, Semiquant: 500
Specific Gravity, Urine: 1.02
WBC Urine, dipstick: NEGATIVE
pH: 5.5

## 2008-11-11 ENCOUNTER — Encounter (INDEPENDENT_AMBULATORY_CARE_PROVIDER_SITE_OTHER): Payer: Self-pay | Admitting: Family Medicine

## 2008-11-19 ENCOUNTER — Inpatient Hospital Stay (HOSPITAL_COMMUNITY): Admission: EM | Admit: 2008-11-19 | Discharge: 2008-11-22 | Payer: Self-pay | Admitting: Emergency Medicine

## 2008-11-26 ENCOUNTER — Telehealth (INDEPENDENT_AMBULATORY_CARE_PROVIDER_SITE_OTHER): Payer: Self-pay | Admitting: Family Medicine

## 2008-11-28 ENCOUNTER — Encounter (INDEPENDENT_AMBULATORY_CARE_PROVIDER_SITE_OTHER): Payer: Self-pay | Admitting: Family Medicine

## 2008-12-10 ENCOUNTER — Encounter (INDEPENDENT_AMBULATORY_CARE_PROVIDER_SITE_OTHER): Payer: Self-pay | Admitting: Family Medicine

## 2008-12-18 ENCOUNTER — Encounter (INDEPENDENT_AMBULATORY_CARE_PROVIDER_SITE_OTHER): Payer: Self-pay | Admitting: Family Medicine

## 2009-01-13 ENCOUNTER — Encounter (INDEPENDENT_AMBULATORY_CARE_PROVIDER_SITE_OTHER): Payer: Self-pay | Admitting: Surgery

## 2009-01-13 ENCOUNTER — Ambulatory Visit (HOSPITAL_COMMUNITY): Admission: RE | Admit: 2009-01-13 | Discharge: 2009-01-13 | Payer: Self-pay | Admitting: Surgery

## 2009-01-19 ENCOUNTER — Encounter (INDEPENDENT_AMBULATORY_CARE_PROVIDER_SITE_OTHER): Payer: Self-pay | Admitting: Family Medicine

## 2009-01-20 ENCOUNTER — Inpatient Hospital Stay (HOSPITAL_COMMUNITY): Admission: RE | Admit: 2009-01-20 | Discharge: 2009-01-22 | Payer: Self-pay | Admitting: Cardiovascular Disease

## 2009-01-20 LAB — CONVERTED CEMR LAB
Creatinine, Ser: 0.77 mg/dL
Glucose, Urine, Semiquant: 138
Potassium: 3.4 meq/L
Sodium: 139 meq/L

## 2009-02-03 ENCOUNTER — Ambulatory Visit: Payer: Self-pay | Admitting: Family Medicine

## 2009-02-03 ENCOUNTER — Encounter (INDEPENDENT_AMBULATORY_CARE_PROVIDER_SITE_OTHER): Payer: Self-pay | Admitting: Family Medicine

## 2009-02-03 DIAGNOSIS — F39 Unspecified mood [affective] disorder: Secondary | ICD-10-CM | POA: Insufficient documentation

## 2009-02-06 ENCOUNTER — Encounter (INDEPENDENT_AMBULATORY_CARE_PROVIDER_SITE_OTHER): Payer: Self-pay | Admitting: Family Medicine

## 2009-02-13 ENCOUNTER — Encounter (INDEPENDENT_AMBULATORY_CARE_PROVIDER_SITE_OTHER): Payer: Self-pay | Admitting: Family Medicine

## 2009-02-17 ENCOUNTER — Telehealth: Payer: Self-pay | Admitting: *Deleted

## 2009-02-20 ENCOUNTER — Encounter (INDEPENDENT_AMBULATORY_CARE_PROVIDER_SITE_OTHER): Payer: Self-pay | Admitting: Family Medicine

## 2009-03-03 ENCOUNTER — Telehealth (INDEPENDENT_AMBULATORY_CARE_PROVIDER_SITE_OTHER): Payer: Self-pay | Admitting: Family Medicine

## 2009-03-10 ENCOUNTER — Telehealth (INDEPENDENT_AMBULATORY_CARE_PROVIDER_SITE_OTHER): Payer: Self-pay | Admitting: Family Medicine

## 2009-03-11 ENCOUNTER — Ambulatory Visit: Payer: Self-pay | Admitting: Family Medicine

## 2009-03-11 DIAGNOSIS — N644 Mastodynia: Secondary | ICD-10-CM | POA: Insufficient documentation

## 2009-03-23 ENCOUNTER — Inpatient Hospital Stay (HOSPITAL_COMMUNITY): Admission: EM | Admit: 2009-03-23 | Discharge: 2009-03-25 | Payer: Self-pay | Admitting: Emergency Medicine

## 2009-03-30 ENCOUNTER — Encounter: Payer: Self-pay | Admitting: Family Medicine

## 2009-04-20 ENCOUNTER — Encounter (INDEPENDENT_AMBULATORY_CARE_PROVIDER_SITE_OTHER): Payer: Self-pay | Admitting: Surgery

## 2009-04-20 ENCOUNTER — Ambulatory Visit (HOSPITAL_COMMUNITY): Admission: RE | Admit: 2009-04-20 | Discharge: 2009-04-20 | Payer: Self-pay | Admitting: Surgery

## 2009-04-28 ENCOUNTER — Encounter: Payer: Self-pay | Admitting: Family Medicine

## 2009-06-01 ENCOUNTER — Emergency Department (HOSPITAL_COMMUNITY): Admission: EM | Admit: 2009-06-01 | Discharge: 2009-06-01 | Payer: Self-pay | Admitting: Emergency Medicine

## 2009-06-09 ENCOUNTER — Encounter: Payer: Self-pay | Admitting: Family Medicine

## 2009-06-12 ENCOUNTER — Encounter: Payer: Self-pay | Admitting: Family Medicine

## 2009-06-16 ENCOUNTER — Telehealth: Payer: Self-pay | Admitting: *Deleted

## 2009-06-18 ENCOUNTER — Encounter: Payer: Self-pay | Admitting: Family Medicine

## 2009-06-18 DIAGNOSIS — I6529 Occlusion and stenosis of unspecified carotid artery: Secondary | ICD-10-CM

## 2009-06-24 ENCOUNTER — Ambulatory Visit: Payer: Self-pay | Admitting: Family Medicine

## 2009-07-05 ENCOUNTER — Encounter: Payer: Self-pay | Admitting: Sports Medicine

## 2009-07-05 ENCOUNTER — Inpatient Hospital Stay (HOSPITAL_COMMUNITY): Admission: EM | Admit: 2009-07-05 | Discharge: 2009-07-10 | Payer: Self-pay | Admitting: Emergency Medicine

## 2009-07-05 ENCOUNTER — Ambulatory Visit: Payer: Self-pay | Admitting: Family Medicine

## 2009-07-06 ENCOUNTER — Encounter: Payer: Self-pay | Admitting: Sports Medicine

## 2009-07-06 ENCOUNTER — Ambulatory Visit: Payer: Self-pay | Admitting: Vascular Surgery

## 2009-07-13 ENCOUNTER — Encounter: Payer: Self-pay | Admitting: Family Medicine

## 2009-07-16 ENCOUNTER — Encounter: Payer: Self-pay | Admitting: Family Medicine

## 2009-07-23 ENCOUNTER — Encounter: Payer: Self-pay | Admitting: Family Medicine

## 2009-07-23 ENCOUNTER — Inpatient Hospital Stay (HOSPITAL_COMMUNITY): Admission: RE | Admit: 2009-07-23 | Discharge: 2009-07-26 | Payer: Self-pay | Admitting: Cardiovascular Disease

## 2009-07-31 ENCOUNTER — Encounter: Payer: Self-pay | Admitting: Family Medicine

## 2009-08-11 ENCOUNTER — Ambulatory Visit (HOSPITAL_COMMUNITY): Admission: RE | Admit: 2009-08-11 | Discharge: 2009-08-11 | Payer: Self-pay | Admitting: Cardiovascular Disease

## 2009-09-07 ENCOUNTER — Ambulatory Visit: Payer: Self-pay | Admitting: Family Medicine

## 2009-09-10 ENCOUNTER — Telehealth (INDEPENDENT_AMBULATORY_CARE_PROVIDER_SITE_OTHER): Payer: Self-pay | Admitting: *Deleted

## 2009-09-23 ENCOUNTER — Telehealth (INDEPENDENT_AMBULATORY_CARE_PROVIDER_SITE_OTHER): Payer: Self-pay | Admitting: *Deleted

## 2009-09-25 ENCOUNTER — Telehealth: Payer: Self-pay | Admitting: Sports Medicine

## 2009-09-29 ENCOUNTER — Telehealth (INDEPENDENT_AMBULATORY_CARE_PROVIDER_SITE_OTHER): Payer: Self-pay | Admitting: *Deleted

## 2009-10-02 ENCOUNTER — Encounter: Admission: RE | Admit: 2009-10-02 | Discharge: 2009-10-30 | Payer: Self-pay | Admitting: Sports Medicine

## 2009-10-14 ENCOUNTER — Encounter: Payer: Self-pay | Admitting: Sports Medicine

## 2009-10-14 ENCOUNTER — Telehealth (INDEPENDENT_AMBULATORY_CARE_PROVIDER_SITE_OTHER): Payer: Self-pay | Admitting: *Deleted

## 2009-10-20 ENCOUNTER — Telehealth: Payer: Self-pay | Admitting: Family Medicine

## 2009-10-28 ENCOUNTER — Encounter: Payer: Self-pay | Admitting: Family Medicine

## 2009-11-13 ENCOUNTER — Telehealth: Payer: Self-pay | Admitting: Family Medicine

## 2009-12-01 ENCOUNTER — Ambulatory Visit: Payer: Self-pay | Admitting: Family Medicine

## 2009-12-01 LAB — CONVERTED CEMR LAB
Protein, U semiquant: 30
Urobilinogen, UA: 0.2
WBC Urine, dipstick: NEGATIVE

## 2009-12-29 ENCOUNTER — Ambulatory Visit: Payer: Self-pay | Admitting: Family Medicine

## 2010-01-11 ENCOUNTER — Ambulatory Visit: Payer: Self-pay | Admitting: Family Medicine

## 2010-01-11 ENCOUNTER — Inpatient Hospital Stay (HOSPITAL_COMMUNITY): Admission: EM | Admit: 2010-01-11 | Discharge: 2010-01-19 | Payer: Self-pay | Admitting: Emergency Medicine

## 2010-01-11 ENCOUNTER — Encounter: Payer: Self-pay | Admitting: Family Medicine

## 2010-01-11 ENCOUNTER — Ambulatory Visit: Payer: Self-pay | Admitting: Vascular Surgery

## 2010-01-20 ENCOUNTER — Encounter: Payer: Self-pay | Admitting: Family Medicine

## 2010-01-26 ENCOUNTER — Ambulatory Visit: Payer: Self-pay | Admitting: Family Medicine

## 2010-02-28 IMAGING — CT CT HEAD W/O CM
1 series · 16 of 30 positions shown, 20 images · non-contrast
Comparison: MRI 07/06/2009.  CT 07/05/2009

CLINICAL DATA: Headache  History of stroke.  Recent carotid stent.

CT HEAD WITHOUT CONTRAST
TECHNIQUE: Contiguous axial images were obtained from the base of
the skull through the vertex without contrast.

[Series 2: headseq 4.8 h45s · axial · 0.43mm/px · z∈[-142,-14]mm · 16 of 30 slices shown, 20 images]
[im 2/30  brain]
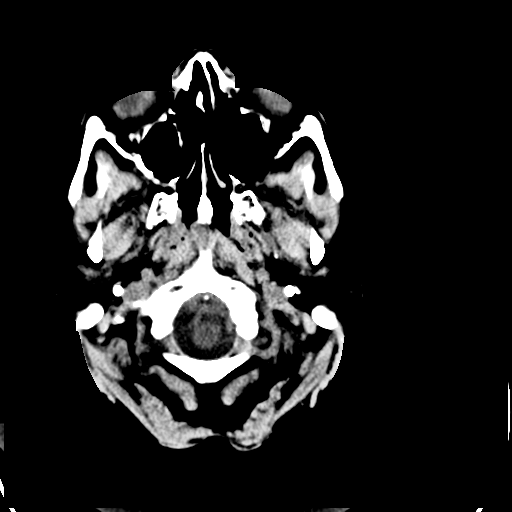
[im 2/30  bone]
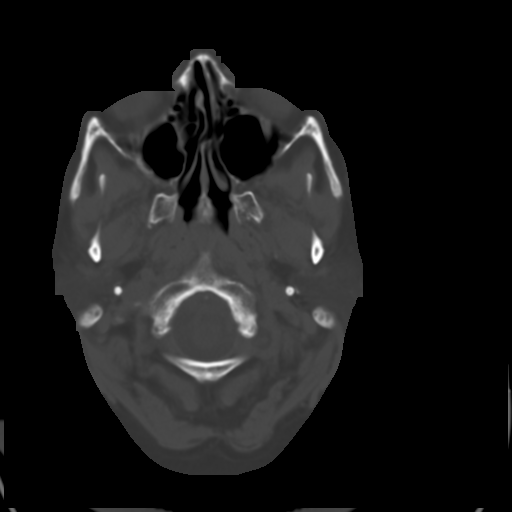
[im 4/30  brain]
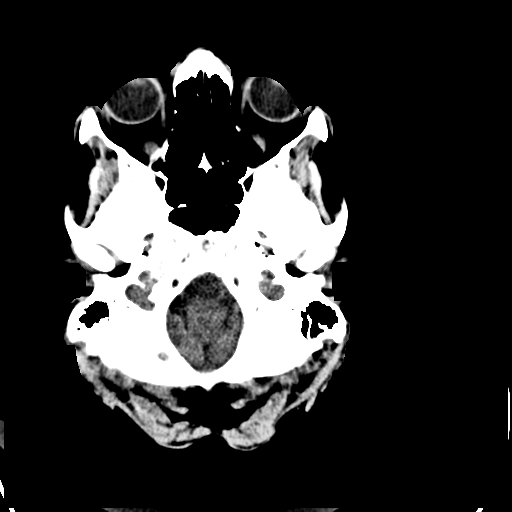
[im 6/30  brain]
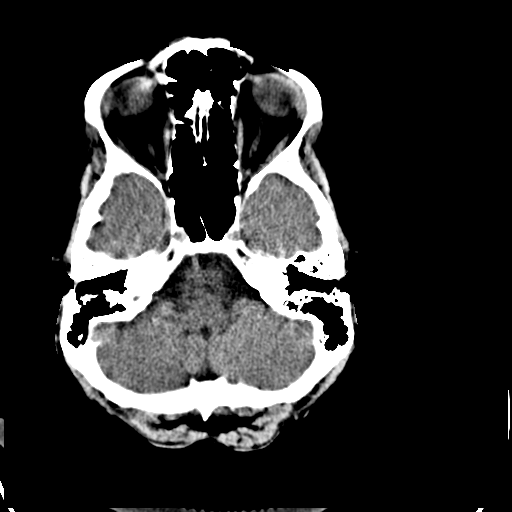
[im 8/30  brain]
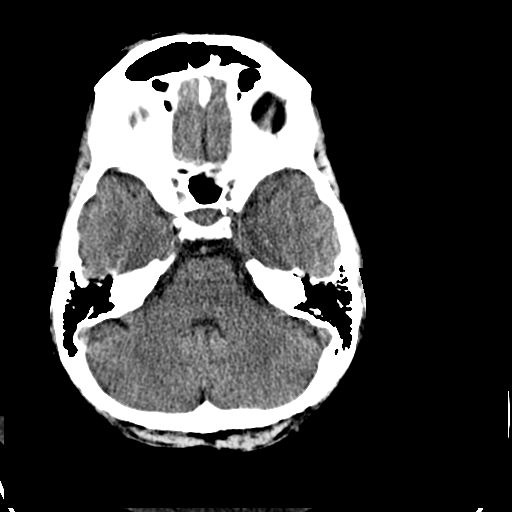
[im 9/30  brain]
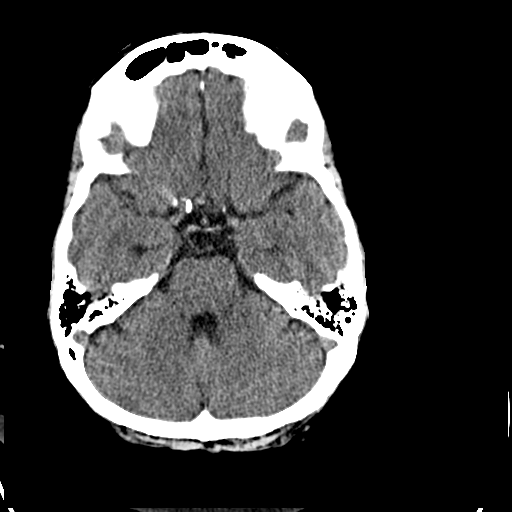
[im 9/30  bone]
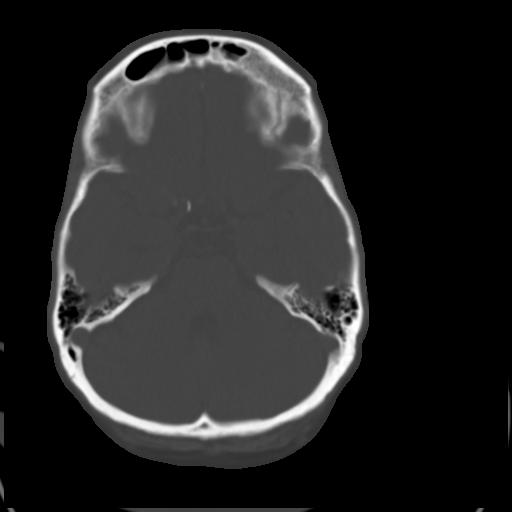
[im 11/30  brain]
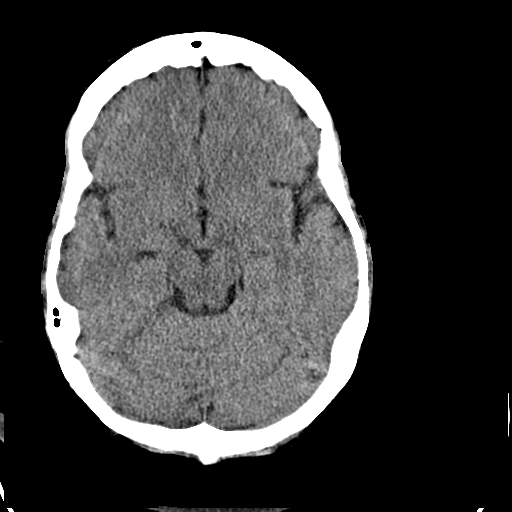
[im 13/30  brain]
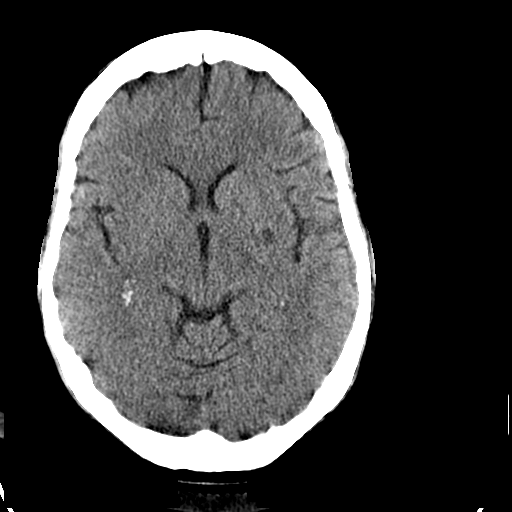
[im 15/30  brain]
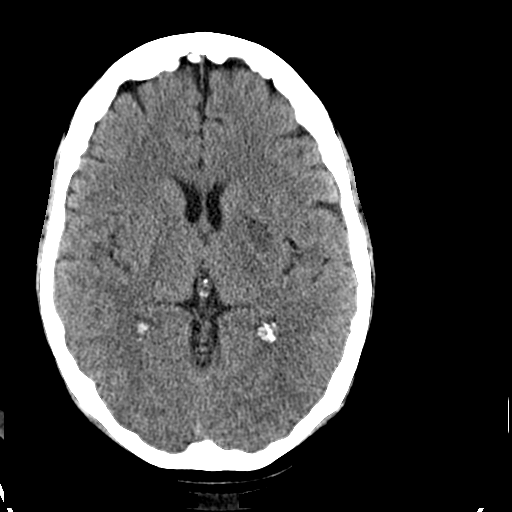
[im 16/30  brain]
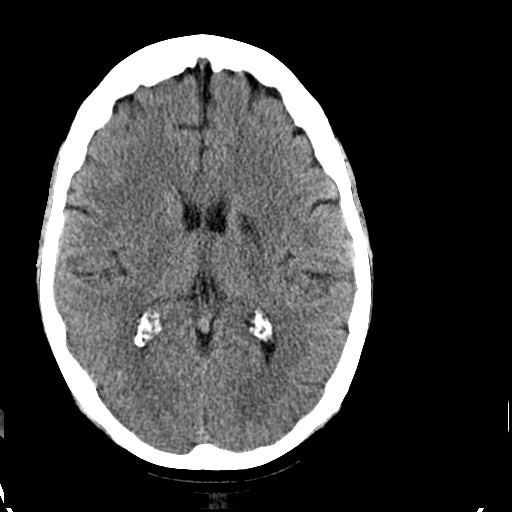
[im 16/30  bone]
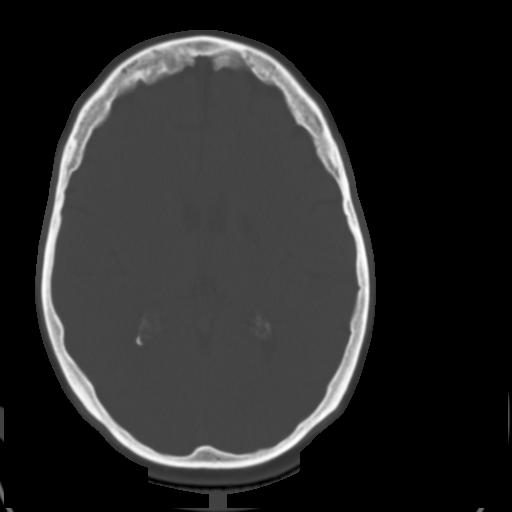
[im 18/30  brain]
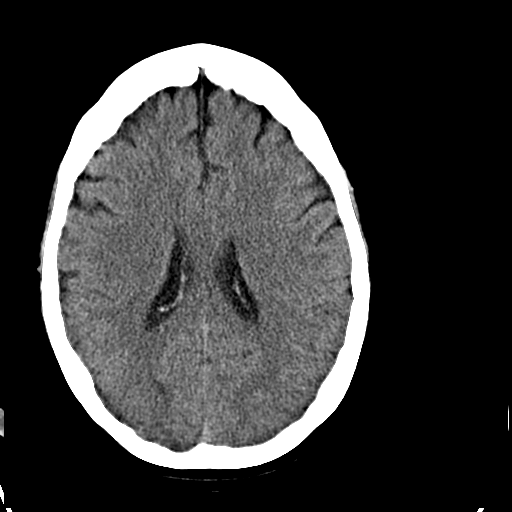
[im 20/30  brain]
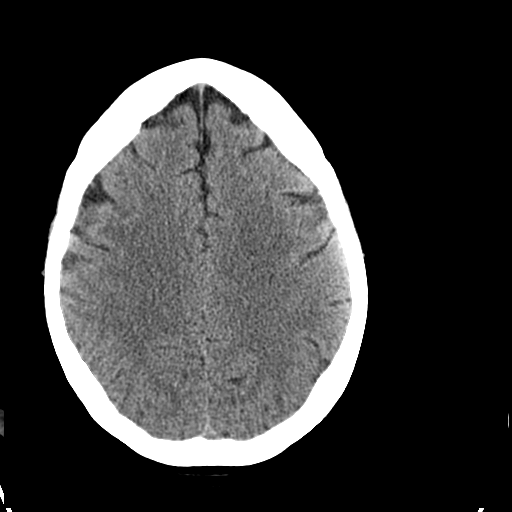
[im 22/30  brain]
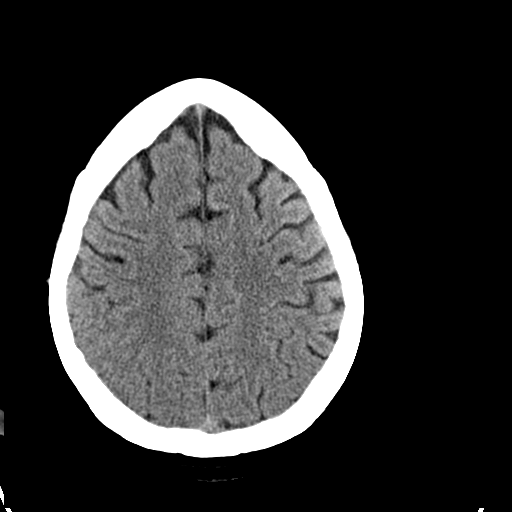
[im 23/30  brain]
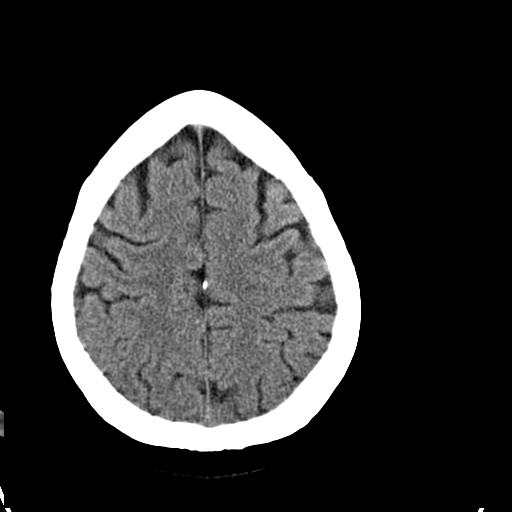
[im 23/30  bone]
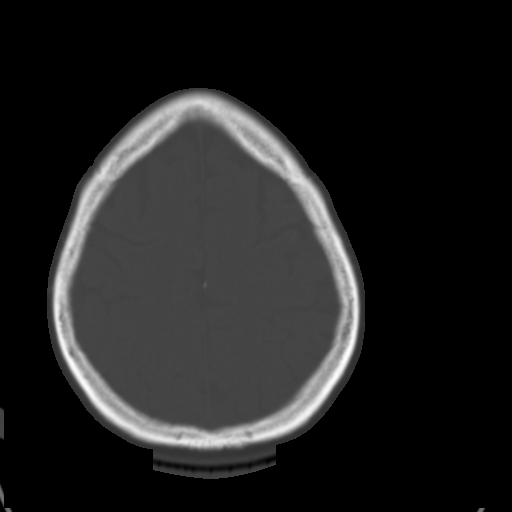
[im 25/30  brain]
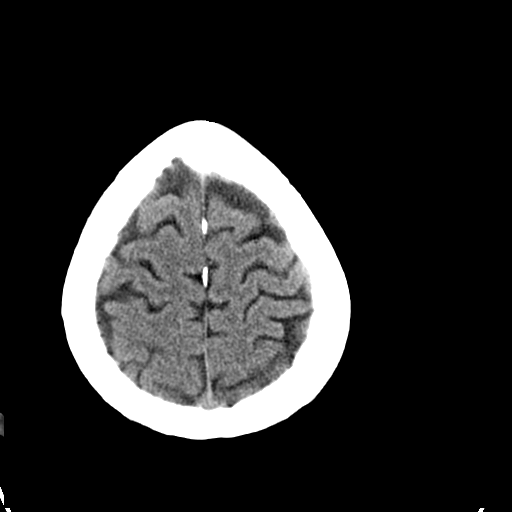
[im 27/30  brain]
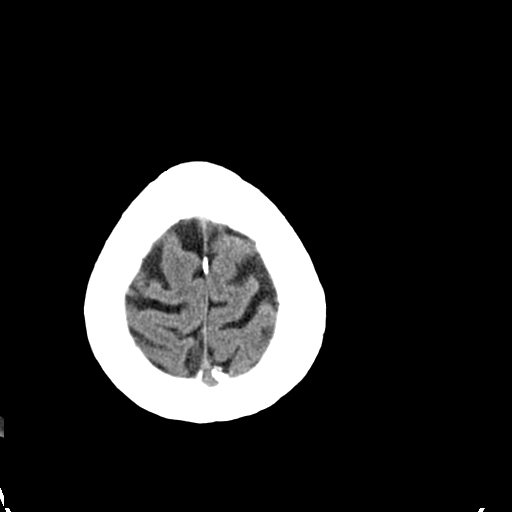
[im 29/30  brain]
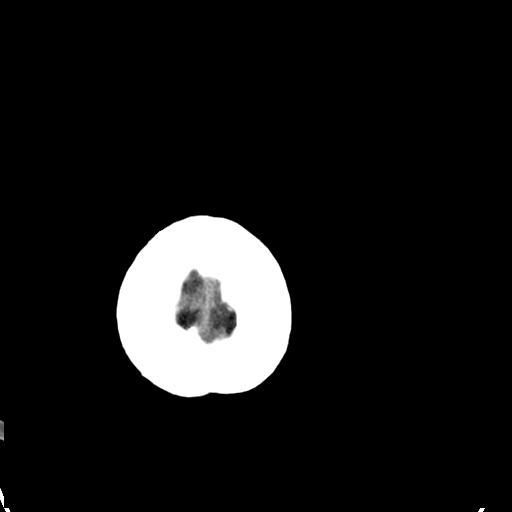

[16 of 30 positions shown; findings below may reference images not displayed]

FINDINGS: Again appreciated is the acute/subacute nonhemorrhagic
infarction involving the posterior aspect of the left putamen
extending up into the left corona radiata.  No hemorrhage.  No
significant mass effect is appreciated.
IMPRESSION: No significant change in the appearance of the infarction involving
the left putamen and corona radiata.  No intracranial hemorrhage.

## 2010-03-03 ENCOUNTER — Telehealth: Payer: Self-pay | Admitting: Family Medicine

## 2010-04-07 ENCOUNTER — Telehealth: Payer: Self-pay | Admitting: Family Medicine

## 2010-04-08 ENCOUNTER — Ambulatory Visit: Payer: Self-pay | Admitting: Family Medicine

## 2010-04-08 DIAGNOSIS — E1159 Type 2 diabetes mellitus with other circulatory complications: Secondary | ICD-10-CM

## 2010-04-08 DIAGNOSIS — N390 Urinary tract infection, site not specified: Secondary | ICD-10-CM

## 2010-04-08 LAB — CONVERTED CEMR LAB
Protein, U semiquant: 100
RBC / HPF: NEGATIVE
Specific Gravity, Urine: 1.02
Urobilinogen, UA: 0.2
pH: 5

## 2010-04-09 ENCOUNTER — Telehealth: Payer: Self-pay | Admitting: Family Medicine

## 2010-04-13 ENCOUNTER — Encounter: Payer: Self-pay | Admitting: Family Medicine

## 2010-04-13 ENCOUNTER — Ambulatory Visit: Payer: Self-pay | Admitting: Family Medicine

## 2010-04-13 LAB — CONVERTED CEMR LAB
ALT: <8 units/L (ref 0–35)
Albumin: 4.3 g/dL (ref 3.5–5.2)
CO2: 17 meq/L — ABNORMAL LOW (ref 19–32)
Calcium: 9.8 mg/dL (ref 8.4–10.5)
Chloride: 103 meq/L (ref 96–112)
Glucose, Bld: 502 mg/dL — ABNORMAL HIGH (ref 70–99)
Hgb A1c MFr Bld: 12 %
MCV: 84.3 fL (ref 78.0–100.0)
Platelets: 336 10*3/uL (ref 150–400)
Potassium: 4.7 meq/L (ref 3.5–5.3)
RBC: 5.09 M/uL (ref 3.87–5.11)
Sodium: 134 meq/L — ABNORMAL LOW (ref 135–145)
Total Protein: 7.4 g/dL (ref 6.0–8.3)
WBC: 7.4 10*3/uL (ref 4.0–10.5)

## 2010-04-14 ENCOUNTER — Encounter: Payer: Self-pay | Admitting: Family Medicine

## 2010-05-24 ENCOUNTER — Ambulatory Visit: Payer: Self-pay | Admitting: Family Medicine

## 2010-07-08 ENCOUNTER — Encounter: Payer: Self-pay | Admitting: Family Medicine

## 2010-07-08 ENCOUNTER — Ambulatory Visit: Payer: Self-pay | Admitting: Family Medicine

## 2010-07-08 DIAGNOSIS — R279 Unspecified lack of coordination: Secondary | ICD-10-CM

## 2010-07-08 DIAGNOSIS — R5381 Other malaise: Secondary | ICD-10-CM | POA: Insufficient documentation

## 2010-07-08 DIAGNOSIS — R5383 Other fatigue: Secondary | ICD-10-CM

## 2010-07-08 LAB — CONVERTED CEMR LAB
ALT: 12 units/L (ref 0–35)
Alkaline Phosphatase: 114 units/L (ref 39–117)
Creatinine, Ser: 1.3 mg/dL — ABNORMAL HIGH (ref 0.40–1.20)
HCT: 42.1 % (ref 36.0–46.0)
Hgb A1c MFr Bld: >14 %
MCHC: 32.3 g/dL (ref 30.0–36.0)
MCV: 89.4 fL (ref 78.0–100.0)
Platelets: 326 10*3/uL (ref 150–400)
Sodium: 133 meq/L — ABNORMAL LOW (ref 135–145)
TSH: 0.689 microintl units/mL (ref 0.350–4.500)
Total Bilirubin: 0.2 mg/dL — ABNORMAL LOW (ref 0.3–1.2)
Total Protein: 7 g/dL (ref 6.0–8.3)
WBC: 8.4 10*3/uL (ref 4.0–10.5)

## 2010-07-09 ENCOUNTER — Telehealth: Payer: Self-pay | Admitting: Family Medicine

## 2010-07-21 ENCOUNTER — Encounter: Payer: Self-pay | Admitting: Family Medicine

## 2010-09-14 ENCOUNTER — Ambulatory Visit: Admission: RE | Admit: 2010-09-14 | Discharge: 2010-09-14 | Payer: Self-pay | Source: Home / Self Care

## 2010-09-14 ENCOUNTER — Encounter: Payer: Self-pay | Admitting: Family Medicine

## 2010-09-14 LAB — CONVERTED CEMR LAB
Nitrite: POSITIVE
Protein, U semiquant: 30
Urobilinogen, UA: 0.2
Whiff Test: NEGATIVE

## 2010-09-15 ENCOUNTER — Encounter: Payer: Self-pay | Admitting: Family Medicine

## 2010-09-15 LAB — CONVERTED CEMR LAB
ALT: 11 units/L (ref 0–35)
AST: 8 units/L (ref 0–37)
Albumin: 4.5 g/dL (ref 3.5–5.2)
Alkaline Phosphatase: 101 units/L (ref 39–117)
Calcium: 9.7 mg/dL (ref 8.4–10.5)
Chloride: 97 meq/L (ref 96–112)
Creatinine, Ser: 0.91 mg/dL (ref 0.40–1.20)
Potassium: 5.1 meq/L (ref 3.5–5.3)

## 2010-09-20 ENCOUNTER — Ambulatory Visit: Admission: RE | Admit: 2010-09-20 | Discharge: 2010-09-20 | Payer: Self-pay | Source: Home / Self Care

## 2010-09-20 DIAGNOSIS — S300XXA Contusion of lower back and pelvis, initial encounter: Secondary | ICD-10-CM | POA: Insufficient documentation

## 2010-09-20 DIAGNOSIS — N952 Postmenopausal atrophic vaginitis: Secondary | ICD-10-CM | POA: Insufficient documentation

## 2010-09-27 LAB — GLUCOSE, CAPILLARY: Glucose-Capillary: 500 mg/dL — ABNORMAL HIGH (ref 70–99)

## 2010-09-28 ENCOUNTER — Ambulatory Visit: Admit: 2010-09-28 | Payer: Self-pay

## 2010-10-03 ENCOUNTER — Encounter: Payer: Self-pay | Admitting: Family Medicine

## 2010-10-03 ENCOUNTER — Encounter: Payer: Self-pay | Admitting: Internal Medicine

## 2010-10-03 ENCOUNTER — Encounter: Payer: Self-pay | Admitting: Thoracic Surgery (Cardiothoracic Vascular Surgery)

## 2010-10-04 ENCOUNTER — Encounter: Payer: Self-pay | Admitting: Sports Medicine

## 2010-10-04 ENCOUNTER — Encounter: Payer: Self-pay | Admitting: Family Medicine
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
## 2010-10-14 NOTE — Consult Note (Signed)
Summary: [facility]   Imported By: Clydell Hakim 12/16/2009 11:24:30  _____________________________________________________________________  External Attachment:    Type:   Image     Comment:   External Document
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
## 2010-10-14 NOTE — Progress Notes (Signed)
Summary: [name]  Phone Note Call from Patient Call back at [phone]   Caller: Patient Summary of Call: Pt checking on status of referrals for physical therapy and pain clinic. Initial call taken by: Clydell Hakim,  September 23, 2009 4:18 PM  Follow-up for Phone Call        explained to patient that [facility] unw[facility] her [facility] and a referral has been sent to [facility] and it could still be a few weeks before we hear  form  that appointment .  also  advised that  areferral was faxed to Outpatient Physical Therapy on 09/07/2009 but when I called just now I was told they have no referral. advised patient that it will be sent again today and RN will follow up and make sure this gets scheduled and will let her know. Follow-up by: Theresia Lo RN,  September 23, 2009 4:42 PM

## 2010-10-14 NOTE — Consult Note (Signed)
Summary: [facility]   Imported By: De Nurse 07/08/2010 15:10:51  _____________________________________________________________________  External Attachment:    Type:   Image     Comment:   External Document
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
## 2010-10-14 NOTE — Progress Notes (Signed)
Summary: phn msg  Phone Note Call from Patient Call back at [phone]   Caller: Patient Summary of Call: she called rehab 3 times and they tell her that they have not rec'd anything from [place] yet.  pls advise Initial call taken by: De Nurse,  September 29, 2009 9:59 AM  Follow-up for Phone Call        Called Physical Therapy Outpatient  and although referral has been faxed twice , once on 09/07/2009 and 09/23/2009 they do not have a referal from [place]. we do have conformation that referal was received. will fax again and follow up. Follow-up by: Theresia Lo RN,  September 29, 2009 10:51 AM  Additional Follow-up for Phone Call Additional follow up Details #1::        Found out that referral from Outpatient PT  [facility]. had been sent to [facility] [address]. due to diagnosis of unsteady gait. called  this clinic and appointment is scheduled for 10/02/2009 at 2:45 PM. patient notified. Additional Follow-up by: Theresia Lo RN,  September 29, 2009 11:56 AM

## 2010-10-14 NOTE — Progress Notes (Signed)
Summary: triage  Phone Note Call from Patient Call back at Home Phone [phone]   Caller: Patient Summary of Call: Pt says the rx Dr Sandi Mealy gave her yesterday needed a proir authorization and she is wondering if it has been done yet? Initial call taken by: Clydell Hakim,  April 09, 2010 1:38 PM  Follow-up for Phone Call        told her from [facility] guidlines, I do not believe she meets the criteria for Lyrica. I have sent a text to Dr. Sandi Mealy. she says she just wants something for her leg pain sent in today. could not sleep last night. Follow-up by: Golden Circle RN,  April 09, 2010 1:59 PM  Additional Follow-up for Phone Call Additional follow up Details #1::        spoke with Dr. Sandi Mealy & reviewed pt's meds & the medicaid form. asked that I call in Elavil 50mg  by mouth HS.  #31 with 1 RF. I called it to her pharmacist & told him to d/c the lyrica since she cannot get it thru medicaid Additional Follow-up by: Golden Circle RN,  April 09, 2010 3:33 PM     Appended Document: triage    Clinical Lists Changes  Medications: Removed medication of LYRICA 75 MG CAPS (PREGABALIN) 1 by mouth at bedtime for diabetic leg pain  (note to pharmacist - has failed gabapentin) Added new medication of AMITRIPTYLINE HCL 50 MG TABS (AMITRIPTYLINE HCL) 1 by mouth at bedtime for pain - Signed Rx of AMITRIPTYLINE HCL 50 MG TABS (AMITRIPTYLINE HCL) 1 by mouth at bedtime for pain;  #31 x 1;  Signed;  Entered by: Ancil Boozer  MD;  Authorized by: Ancil Boozer  MD;  Method used: Telephoned to [facility]*, [address], [place], [place]  [address], Ph: [phone] or [phone], Fax: [phone]    Prescriptions: AMITRIPTYLINE HCL 50 MG TABS (AMITRIPTYLINE HCL) 1 by mouth at bedtime for pain  #31 x 1   Entered and Authorized by:   Ancil Boozer  MD   Signed by:   Ancil Boozer  MD on 04/12/2010   Method used:   Telephoned to ...       [facility] (retail)       [address] [place], [place]  [address]       Ph: [phone] or [phone]       Fax: [phone]   RxID:   [id number]

## 2010-10-14 NOTE — Assessment & Plan Note (Signed)
 Summary: pain pill problem,df   Vital Signs:  Patient profile:   50 year old female Height:      65 inches Weight:      174.8 pounds BMI:     29.19 Temp:     97.7 degrees F oral Pulse rate:   79 / minute BP sitting:   172 / 113  (left arm) Cuff size:   regular  Vitals Entered By: Olam Keeling (September 07, 2009 10:57 AM) CC: Discuss getting off pain meds Is Patient Diabetic? Yes Did you bring your meter with you today? No Pain Assessment Patient in pain? yes     Location: all over Intensity: 8 Type: aching   Primary Care Provider:  Vernell Massa MD  CC:  Discuss getting off pain meds.  History of Present Illness: 50yo F, recently in hospital for carotid stenting, has concerns about percocet use.  Has been hooked for years, using up to 15 percocet 10mg  tabs a day.  Wants to cut back/quit.  Getting narcotics from the street, says other providers have not been giving them to her.  Amenable to pain clinic referral.  Falls:  Noted to be unstable with walking.  Has cane but does not use it.  No SOB/CP/dizziness/weakness when she falls, no LOC, just unsteady.  Amenable to PT referral.  Habits & Providers  Alcohol-Tobacco-Diet     Tobacco Status: current     Tobacco Counseling: to quit use of tobacco products     Cigarette Packs/Day: 0.75  Current Medications (verified): 1)  Aciphex 20 Mg Tbec (Rabeprazole Sodium) .... Take 1 Tab Every Morning 2)  Ambien  10 Mg Tabs (Zolpidem  Tartrate) .... Take 1 Tablet By Mouth At Bedtime 3)  Aspirin  Ec 81 Mg Tbec (Aspirin ) .... Take 1 Tablet By Mouth Once A Day 4)  Lite Touch Lancets  Misc (Lancets) .... Use 1 Unit As Directed Twice A Day 5)  Crestor 20 Mg Tabs (Rosuvastatin Calcium) .... One Tab By Mouth Daily 6)  Novolog  Mix 70/30 Penfill 70-30 %  Susp (Insulin  Aspart Prot & Aspart) .... 80u Qam and 50u Qpm Before Meals. 7)  Neurontin  600 Mg  Tabs (Gabapentin ) .... One Qam and 2 Qpm - Per Dr Milton 8)  Plavix 75 Mg  Tabs  (Clopidogrel Bisulfate) .... One Daily 9)  Nitrostat  0.4 Mg  Subl (Nitroglycerin ) .... As Directed 10)  Lopressor 100 Mg Tabs (Metoprolol Tartrate) .... One By Mouth Three Times A Day Per Dr Court 11)  Lisinopril  20 Mg Tabs (Lisinopril ) .... One By Mouth Two Times A Day 12)  Zetia 10 Mg  Tabs (Ezetimibe) .... 2 Tablets Daily 13)  Topamax 100 Mg  Tabs (Topiramate) .SABRA.. 1 Qpm - Per Dr Milton 14)  Lovaza 1 Gm  Caps (Omega-3-Acid Ethyl Esters) .... One Two Times A Day 15)  Hydrochlorothiazide 25 Mg Tabs (Hydrochlorothiazide) .... One By Mouth Daily 16)  Ranexa 1000 Mg Xr12h-Tab (Ranolazine) .... One By Mouth Two Times A Day By Dr Court 17)  Lexapro 10 Mg Tabs (Escitalopram Oxalate) .... Per Dr Court of Cardiology 18)  Oxycodone -Acetaminophen  7.5-325 Mg Tabs (Oxycodone -Acetaminophen ) .... One Tab By Mouth No More Than 2x A Day  Allergies (verified): 1)  ! Augmentin 2)  ! * Contrast Dye  Social History: Packs/Day:  0.75  Review of Systems       See HPI  Physical Exam  General:  Well-developed,well-nourished,in no acute distress; alert,appropriate and cooperative throughout examination Extremities:  No clubbing, cyanosis, edema, or deformity noted  with normal full range of motion of all joints.   Neurologic:  No cranial nerve deficits noted. Station and gait are normal. Plantar reflexes are down-going bilaterally. DTRs are symmetrical throughout. Sensory, motor and coordinative functions appear intact.   Impression & Recommendations:  Problem # 1:  UNSTEADY GAIT (ICD-781.2) Assessment New PT referral, will use cane when ambulating.  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  Gave 1 month supply percocet but no further narcotics to be given from [facility].  Orders: [facility]- Est  Level 4 (99214) Pain Clinic Referral (Pain)  Complete Medication List: 1)  Aciphex 20 Mg  Tbec (Rabeprazole sodium) .... Take 1 tab every morning 2)  Ambien  10 Mg Tabs (Zolpidem  tartrate) .... Take 1 tablet by mouth at bedtime 3)  Aspirin  Ec 81 Mg Tbec (Aspirin ) .... Take 1 tablet by mouth once a day 4)  Lite Touch Lancets Misc (Lancets) .... Use 1 unit as directed twice a day 5)  Crestor 20 Mg Tabs (Rosuvastatin calcium) .... One tab by mouth daily 6)  Novolog  Mix 70/30 Penfill 70-30 % Susp (Insulin  aspart prot & aspart) .... 80u qam and 50u qpm 30minutes before meals. 7)  Neurontin  600 Mg Tabs (Gabapentin ) .... One qam and 2 qpm - per dr milton 8)  Plavix 75 Mg Tabs (Clopidogrel bisulfate) .... One daily 9)  Nitrostat  0.4 Mg Subl (Nitroglycerin ) .... As directed 10)  Lopressor 100 Mg Tabs (Metoprolol tartrate) .... One by mouth three times a day per dr berry 11)  Lisinopril  20 Mg Tabs (Lisinopril ) .... One by mouth two times a day 12)  Zetia 10 Mg Tabs (Ezetimibe) .... 2 tablets daily 13)  Topamax 100 Mg Tabs (Topiramate) .SABRA.. 1 qpm - per dr milton 14)  Lovaza 1 Gm Caps (Omega-3-acid ethyl esters) .... One two times a day 15)  Hydrochlorothiazide 25 Mg Tabs (Hydrochlorothiazide) .... One by mouth daily 16)  Ranexa 1000 Mg Xr12h-tab (Ranolazine) .... One by mouth two times a day by dr berry 17)  Lexapro 10 Mg Tabs (Escitalopram oxalate) .... Per dr berry of cardiology 18)  Oxycodone -acetaminophen  7.5-325 Mg Tabs (Oxycodone -acetaminophen ) .... One tab by mouth no more than 2x a day  Patient Instructions: 1)  Refilled meds, 2)  Oxycodone  7.5mg  #60, no more refills from here. 3)  Physical therapy referral, 4)  Pain management center referral. 5)  Come back to see Dr. Rickard at your next scheduled appt. 6)  -Dr. ONEIDA. Prescriptions: CRESTOR 20 MG TABS (ROSUVASTATIN CALCIUM) One tab by mouth daily  #90 x 0   Entered and Authorized by:   Debby Petties MD   Signed by:   Debby Petties MD on 09/07/2009   Method used:   Electronically to        [facility]*  (retail)       [address] [place], [place]  [address]       Ph: [phone] or [phone]       Fax: [phone]   RxID:   [id number] OXYCODONE -ACETAMINOPHEN  7.5-325 MG TABS (OXYCODONE -ACETAMINOPHEN ) One tab by mouth no more than 2x a day  #60 x 0   Entered and Authorized by:   Debby Petties MD   Signed by:   Debby Petties MD on 09/07/2009   Method used:   Print then Give to Patient   RxID:   [id number] NEURONTIN  600 MG  TABS (GABAPENTIN ) one qam and 2 qpm - per Dr Milton  #180 x 11   Entered and Authorized by:   Debby Petties MD   Signed by:   Debby Petties MD on 09/07/2009   Method used:   Electronically to        [facility]* (retail)       [address] [place], [place]  [address]       Ph: [phone] or [phone]       Fax: [phone]   RxID:   [id number] TOPAMAX 100 MG  TABS (TOPIRAMATE) 1 qpm - per Dr Milton  #90 x 11   Entered and Authorized by:   Debby Petties MD   Signed by:   Debby Petties MD on 09/07/2009   Method used:   Electronically to        [facility]* (retail)       [address] [place], [place]  [address]       Ph: [phone] or [phone]       Fax: [phone]   RxID:   [id number] RANEXA 1000 MG XR12H-TAB (RANOLAZINE) one by mouth two times a day by Dr Court  #60 x 11   Entered and Authorized by:   Debby Petties MD   Signed by:   Debby Petties MD on 09/07/2009   Method used:   Electronically to        [facility]* (retail)       [address] [place], [place]  [address]       Ph: [phone] or [phone]       Fax: [phone]   RxID:   [id number] LEXAPRO 10 MG TABS (ESCITALOPRAM OXALATE) per Dr Court of cardiology  #90 x 11   Entered and Authorized by:   Debby Petties MD   Signed by:   Debby Petties MD on 09/07/2009   Method used:   Electronically to        [facility] (retail)        [address] [place], [place]  [address]       Ph: [phone] or [phone]       Fax: [phone]   RxID:   [id number] HYDROCHLOROTHIAZIDE 25 MG TABS (HYDROCHLOROTHIAZIDE) one by mouth daily  #90 x 11   Entered and Authorized by:   Debby Petties MD   Signed by:   Debby Petties MD on 09/07/2009   Method used:   Electronically to        [facility]* (retail)       [address] [place], [place]  [address]       Ph: [phone] or [phone]       Fax: [phone]   RxID:   [id number] LOVAZA 1 GM  CAPS (OMEGA-3-ACID ETHYL ESTERS) one two times a day  #180 x 11   Entered and Authorized by:   Debby Petties MD   Signed by:   Debby Petties MD on 09/07/2009   Method used:   Electronically to        [facility] (retail)       [address] [place], [place]  [address]       Ph: [phone] or [phone]       Fax: [phone]   RxID:   [id number]  ZETIA 10 MG  TABS (EZETIMIBE) 2 tablets daily  #180 x 11   Entered and Authorized by:   Debby Petties MD   Signed by:   Debby Petties MD on 09/07/2009   Method used:   Electronically to        [facility] (retail)       [address] [place], [place]  [address]       Ph: [phone] or [phone]       Fax: [phone]   RxID:   [id number] LISINOPRIL  20 MG TABS (LISINOPRIL ) one by mouth two times a day  #180 x 11   Entered and Authorized by:   Debby Petties MD   Signed by:   Debby Petties MD on 09/07/2009   Method used:   Electronically to        [facility] (retail)       [address] [place], [place]  [address]       Ph: [phone] or [phone]       Fax: [phone]   RxID:   [id number] LOPRESSOR 100 MG TABS (METOPROLOL TARTRATE) one by mouth three times a day per Dr Court  #90 x 11   Entered and Authorized by:   Debby Petties MD   Signed by:   Debby Petties MD on  09/07/2009   Method used:   Electronically to        [facility] (retail)       [address] [place], [place]  [address]       Ph: [phone] or [phone]       Fax: [phone]   RxID:   [id number] NITROSTAT  0.4 MG  SUBL (NITROGLYCERIN ) as directed  #10 x 0   Entered and Authorized by:   Debby Petties MD   Signed by:   Debby Petties MD on 09/07/2009   Method used:   Electronically to        [facility] (retail)       [address] [place], [place]  [address]       Ph: [phone] or [phone]       Fax: [phone]   RxID:   [id number] PLAVIX 75 MG  TABS (CLOPIDOGREL BISULFATE) one daily  #90 x 11   Entered and Authorized by:   Debby Petties MD   Signed by:   Debby Petties MD on 09/07/2009   Method used:   Electronically to        [facility] (retail)       [address] [place], [place]  [address]       Ph: [phone] or [phone]       Fax: [phone]   RxID:   [id number] NOVOLOG  MIX 70/30 PENFILL 70-30 %  SUSP (INSULIN  ASPART PROT & ASPART) 80U qam and 50U qpm before meals.  #3 months x 11   Entered and Authorized by:   Debby Petties MD   Signed by:   Debby Petties MD on 09/07/2009   Method used:   Electronically to        [facility] (retail)       [address] [place], [place]  [address]       Ph: [phone] or [phone]       Fax: [phone]  RxID:   [id number] LITE TOUCH LANCETS  MISC (LANCETS) Use 1 unit as directed twice a day  #180 x 11   Entered and Authorized by:   Debby Petties MD   Signed by:   Debby Petties MD on 09/07/2009   Method used:   Electronically to        [facility] (retail)       [address] [place], [place]  [address]       Ph: [phone] or [phone]       Fax: [phone]   RxID:   [id number] ASPIRIN  EC 81 MG TBEC (ASPIRIN ) Take 1 tablet by mouth once a day   #90 x 11   Entered and Authorized by:   Debby Petties MD   Signed by:   Debby Petties MD on 09/07/2009   Method used:   Electronically to        [facility]* (retail)       [address] [place], [place]  [address]       Ph: [phone] or [phone]       Fax: [phone]   RxID:   [id number] ACIPHEX 20 MG TBEC (RABEPRAZOLE SODIUM) Take 1 tab every morning  #90 x 11   Entered and Authorized by:   Debby Petties MD   Signed by:   Debby Petties MD on 09/07/2009   Method used:   Electronically to        [facility]* (retail)       [address] [place], [place]  [address]       Ph: [phone] or [phone]       Fax: [phone]   RxID:   [id number]           Hypertension   Last Blood Pressure: 172 / 113  (09/07/2009)   Serum creatinine: .77  (01/20/2009)   Serum potassium 3.4  (01/20/2009)    Hypertension flowsheet reviewed?: Yes   Progress toward BP goal: Deteriorated

## 2010-10-14 NOTE — Progress Notes (Signed)
Summary: Rx Req  Phone Note Refill Request Call back at [phone] Message from:  Patient  Refills Requested: Medication #1:  PERCOCET 5-325 MG TABS One tab by mouth no more than 2x a day.. WANTED TO SEE IF SHE CAN GET 7.5 325 INSTEAD DUE TO HER GOING TO PHYSICAL THERAPY.    Initial call taken by: Clydell Hakim,  October 20, 2009 11:03 AM  Follow-up for Phone Call        will forward to MD. Appointment has been scheduled with the [facility] for 10/28/2009. Follow-up by: Theresia Lo RN,  October 20, 2009 11:26 AM  Additional Follow-up for Phone Call Additional follow up Details #1::        Last note from Dr. Karie Schwalbe states that [facility] will not give any more pain medicine.  She will have to go the pain clinic.  thanks Additional Follow-up by: Ellery Plunk MD,  October 20, 2009 4:17 PM    Additional Follow-up for Phone Call Additional follow up Details #2::    patient will be out of pain med  this Thrusday. she is currently going to Physical Therapy and states she has much discomfort with this. she does have appointment at [facility] in 8 days . patient requests to ask MD again  if she will give enough to last until pain clinic appointment.    Follow-up by: Theresia Lo RN,  October 20, 2009 4:25 PM   Appended Document: Rx Req Contaced Dr.  Ayesha Mohair on 10/21/09 and she advises again that see will not prescribe anymore pain medication.. patient notified.
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
## 2010-10-14 NOTE — Progress Notes (Signed)
Summary: triage  Phone Note Call from Patient Call back at [phone]   Caller: Patient Summary of Call: Pt has yeast inf and wondering if Diflucon can be called in for her? Initial call taken by: Clydell Hakim,  November 13, 2009 1:43 PM  Follow-up for Phone Call        has had symptoms x 2 weeks. has tried otc meds. offered appt. stateas she has no way to get here & is not going to come in. offered appt next week & she said "forget it" & hung up. it has been months since last OV & I told her they will want to check secretions under the microscope Follow-up by: Golden Circle RN,  November 13, 2009 1:48 PM  Additional Follow-up for Phone Call Additional follow up Details #1::        Will be happy to see her if she wants an appt.  Additional Follow-up by: Ellery Plunk MD,  November 13, 2009 3:06 PM

## 2010-10-14 NOTE — Assessment & Plan Note (Signed)
Summary: yeast inf,df   Vital Signs:  Patient profile:   50 year old female Height:      65 inches Weight:      170 pounds BMI:     28.39 Temp:     98.1 degrees F oral BP sitting:   154 / 100  (left arm) Cuff size:   large  Vitals Entered By: Tessie Fass CMA (December 01, 2009 11:16 AM) CC: yeast infection? Is Patient Diabetic? Yes Pain Assessment Patient in pain? yes     Location: right side Intensity: 8   Primary Care Provider:  Estill Bamberg MD  CC:  yeast infection?.  History of Present Illness: Has had a yeast infection for over a month, took OTC 7 days course of cream.  Has not been taking insulin and blood sugars have been running too high to read.  Discussed this and her role in control.  Larey Seat and hit her right side and it is painful.  Able to bear weight and move around.  She knows that she has to call the pain doctor for more meds.  Habits & Providers  Alcohol-Tobacco-Diet     Tobacco Status: current     Cigarette Packs/Day: 0.75  Current Medications (verified): 1)  Aciphex 20 Mg Tbec (Rabeprazole Sodium) .... Take 1 Tab Every Morning 2)  Ambien 10 Mg Tabs (Zolpidem Tartrate) .... Take 1 Tablet By Mouth At Bedtime 3)  Aspirin Ec 81 Mg Tbec (Aspirin) .... Take 1 Tablet By Mouth Once A Day 4)  Lite Touch Lancets  Misc (Lancets) .... Use 1 Unit As Directed Twice A Day 5)  Crestor 20 Mg Tabs (Rosuvastatin Calcium) .... One Tab By Mouth Daily 6)  Novolog Mix 70/30 Penfill 70-30 %  Susp (Insulin Aspart Prot & Aspart) .... 80u Qam and 50u Qpm Before Meals. 7)  Neurontin 600 Mg  Tabs (Gabapentin) .... One Qam and 2 Qpm - Per Dr Gerilyn Pilgrim 8)  Plavix 75 Mg  Tabs (Clopidogrel Bisulfate) .... One Daily 9)  Nitrostat 0.4 Mg  Subl (Nitroglycerin) .... As Directed 10)  Lopressor 100 Mg Tabs (Metoprolol Tartrate) .... One By Mouth Three Times A Day Per Dr Allyson Sabal 11)  Lisinopril 20 Mg Tabs (Lisinopril) .... One By Mouth Two Times A Day 12)  Zetia 10 Mg  Tabs  (Ezetimibe) .... 2 Tablets Daily 13)  Topamax 100 Mg  Tabs (Topiramate) .Marland Kitchen.. 1 Qpm - Per Dr Gerilyn Pilgrim 14)  Lovaza 1 Gm  Caps (Omega-3-Acid Ethyl Esters) .... One Two Times A Day 15)  Hydrochlorothiazide 25 Mg Tabs (Hydrochlorothiazide) .... One By Mouth Daily 16)  Ranexa 1000 Mg Xr12h-Tab (Ranolazine) .... One By Mouth Two Times A Day By Dr Allyson Sabal 17)  Lexapro 10 Mg Tabs (Escitalopram Oxalate) .... Per Dr Allyson Sabal of Cardiology 18)  Percocet 5-325 Mg Tabs (Oxycodone-Acetaminophen) .... One Tab By Mouth No More Than 2x A Day.  Allergies (verified): 1)  ! Augmentin 2)  ! * Contrast Dye  Review of Systems General:  Denies fever. GU:  Complains of discharge, dysuria, and urinary frequency.  Physical Exam  General:  in no distress Genitalia:  inflammed pernium and vaginal wall.  White cheesy type discharge Msk:  Full ROM of right lower extremitis, no signs of trauma   Impression & Recommendations:  Problem # 1:  VAGINAL PRURITUS (ICD-698.1) Few yeast on wet prep, clinically appeared very inflammed, will treat  by mouth and topically Orders: Wet Prep- [facility] (87210) [facility]- Est Level  3 (62130)  Problem #  2:  DYSURIA (ICD-788.1) Not a great sample but full of WBC, will treat for 3 days Orders: Urinalysis-[facility] (00000) [facility]- Est Level  3 (16109)  Her updated medication list for this problem includes:    Bactrim Ds 800-160 Mg Tabs (Sulfamethoxazole-trimethoprim) ..... One two times a day for 3 days  Complete Medication List: 1)  Aciphex 20 Mg Tbec (Rabeprazole sodium) .... Take 1 tab every morning 2)  Ambien 10 Mg Tabs (Zolpidem tartrate) .... Take 1 tablet by mouth at bedtime 3)  Aspirin Ec 81 Mg Tbec (Aspirin) .... Take 1 tablet by mouth once a day 4)  Lite Touch Lancets Misc (Lancets) .... Use 1 unit as directed twice a day 5)  Crestor 20 Mg Tabs (Rosuvastatin calcium) .... One tab by mouth daily 6)  Novolog Mix 70/30 Penfill 70-30 % Susp (Insulin aspart prot & aspart) .... 80u qam and 50u  qpm before meals. 7)  Neurontin 600 Mg Tabs (Gabapentin) .... One qam and 2 qpm - per dr Gerilyn Pilgrim 8)  Plavix 75 Mg Tabs (Clopidogrel bisulfate) .... One daily 9)  Nitrostat 0.4 Mg Subl (Nitroglycerin) .... As directed 10)  Lopressor 100 Mg Tabs (Metoprolol tartrate) .... One by mouth three times a day per dr berry 11)  Lisinopril 20 Mg Tabs (Lisinopril) .... One by mouth two times a day 12)  Zetia 10 Mg Tabs (Ezetimibe) .... 2 tablets daily 13)  Topamax 100 Mg Tabs (Topiramate) .Marland Kitchen.. 1 qpm - per dr Gerilyn Pilgrim 14)  Lovaza 1 Gm Caps (Omega-3-acid ethyl esters) .... One two times a day 15)  Hydrochlorothiazide 25 Mg Tabs (Hydrochlorothiazide) .... One by mouth daily 16)  Ranexa 1000 Mg Xr12h-tab (Ranolazine) .... One by mouth two times a day by dr berry 17)  Lexapro 10 Mg Tabs (Escitalopram oxalate) .... Per dr berry of cardiology 18)  Percocet 5-325 Mg Tabs (Oxycodone-acetaminophen) .... One tab by mouth no more than 2x a day. 19)  Bactrim Ds 800-160 Mg Tabs (Sulfamethoxazole-trimethoprim) .... One two times a day for 3 days 20)  Fluconazole 150 Mg Tabs (Fluconazole) .... One tab on day 3 of antibioitics and repeat in 2 days 21)  Terazol 7 0.4 % Crea (Terconazole) .... One applicator full at bedtime on inside and ouside  Patient Instructions: 1)  Most important thing you can do for yourself is to control your blood  sugar and quit smoking. 2)  Use medicaion as directed 3)  Please schedule a follow-up appointment in 1 month with prmary MD.  Prescriptions: TERAZOL 7 0.4 % CREA (TERCONAZOLE) one applicator full at bedtime on inside and ouside  #1 x 1   Entered and Authorized by:   Luretha Murphy NP   Signed by:   Luretha Murphy NP on 12/01/2009   Method used:   Electronically to        [facility]* (retail)       [address] [place], [place]  [address]       Ph: [phone] or [phone]       Fax: [phone]   RxID:   [id number] FLUCONAZOLE 150 MG TABS  (FLUCONAZOLE) one tab on day 3 of antibioitics and repeat in 2 days  #2 x 1   Entered and Authorized by:   Luretha Murphy NP   Signed by:   Luretha Murphy NP on 12/01/2009   Method used:   Electronically to        [facility]* (retail)  [address]       [place], [place]  [address]       Ph: [phone] or [phone]       Fax: [phone]   RxID:   [id number] BACTRIM DS 800-160 MG TABS (SULFAMETHOXAZOLE-TRIMETHOPRIM) one two times a day for 3 days  #6 x 0   Entered and Authorized by:   Luretha Murphy NP   Signed by:   Luretha Murphy NP on 12/01/2009   Method used:   Electronically to        [facility]* (retail)       [address] [place], [place]  [address]       Ph: [phone] or [phone]       Fax: [phone]   RxID:   [id number]   Laboratory Results   Urine Tests  Date/Time Received: December 01, 2009 11:25 AM  Date/Time Reported: December 01, 2009 11:44 AM   Routine Urinalysis   Color: yellow Appearance: Cloudy Glucose: 500   (Normal Range: Negative) Bilirubin: negative   (Normal Range: Negative) Ketone: negative   (Normal Range: Negative) Spec. Gravity: 1.010   (Normal Range: 1.003-1.035) Blood: trace-lysed   (Normal Range: Negative) pH: 5.0   (Normal Range: 5.0-8.0) Protein: 30   (Normal Range: Negative) Urobilinogen: 0.2   (Normal Range: 0-1) Nitrite: negative   (Normal Range: Negative) Leukocyte Esterace: negative   (Normal Range: Negative)  Urine Microscopic WBC/HPF: TNTC RBC/HPF: 1-5 Bacteria/HPF: 3+ Epithelial/HPF: 5-10    Comments: ...............test performed by......Marland KitchenBonnie A. [place]az[place], MLS (ASCP)cm  Date/Time Received: December 01, 2009 11:25 AM  Date/Time Reported: December 01, 2009 11:46 AM   [place] Source: vaginal WBC/hpf: 0-3 Bacteria/hpf: 2+  Rods Clue cells/hpf: none  Negative whiff Yeast/hpf: few Trichomonas/hpf: none Comments: ...........test performed by...........Marland KitchenTerese Door,  CMA      Appended Document: yeast inf,df Drug interaction between fluconazole and Plavix; will continue the cream and make fluconazole only one tab, once after course of Bactrim; and use topical cream for 7 days, with repeat in one week if still having symptoms.

## 2010-10-14 NOTE — Assessment & Plan Note (Signed)
Summary: diabetes not controlled/[place]/smith   Vital Signs:  Patient profile:   50 year old female Height:      65 inches Weight:      166.3 pounds BMI:     27.77 Temp:     98.4 degrees F oral Pulse rate:   80 / minute BP sitting:   110 / 81  (left arm) Cuff size:   large  Vitals Entered By: Garen Grams LPN (April 13, 2010 3:58 PM) CC: f/u uncontrolled blood sugar Is Patient Diabetic? Yes Did you bring your meter with you today? No Pain Assessment Patient in pain? yes     Location: feet/legs   Primary Care Kaulin Chaves:  Antoine Primas DO  CC:  f/u uncontrolled blood sugar.  History of Present Illness: 50 yo female here for multiple medical problems.   1.  DM-  Pt states that every time she checks her sugars her meter reporrts too high.  Today pt blood sugar is 479.  Pt states she takes her lantus 40 units two times a day. but it doesn't seem to make a difference. Pt denies any readings at all in the last 2 weeks.  Pt did have a UTi last visit which has resolved but her blood sugars have not gotten any better.  Pt states that she has been watching her diet and does not know what could be causing this. ONce again pt states she is taking all of her lantus.  Denies any nausea vomiting no more dysuria or polyuria then usual.   2.  UTI- resolved, no yeast infection aas she anticipated.  3.  HTN-  Pt was placed on HCTZ last visit by Dr. Sandi Mealy, has been feeling a little light headed recently and gets dizzy when she stands up has never blacked out.  BP 110/68.  Pt though has a hx of gout and was reason that she was not on it in the first place.   4.  chronic pain-  Pt sees a pain clinic but states that they only write a prescription and they never talk to her she does not like the physician.  Pt has been kicked out of one other pain clinic before.  pt is receiveing percocet 7.5 mg Q4 hrs.    5.    leg pain -  Bilateral, radiates from hip to toes, seems to come and goes maybe alittle decrease  feeling of feet but still able to walk without much problem.   Pt has had this in the past as well and seems to be worse with her blood sugar is elevated. Denies any loss of function bowel or bladder problems. She blames some of it on her RLS she thinks she has.   Habits & Providers  Alcohol-Tobacco-Diet     Tobacco Status: current     Tobacco Counseling: to quit use of tobacco products     Cigarette Packs/Day: 0.5  Current Medications (verified): 1)  Lite Touch Lancets  Misc (Lancets) .... Use 1 Unit As Directed Twice A Day 2)  Aspirin Ec 81 Mg Tbec (Aspirin) .... Take 1 Tablet By Mouth Once A Day 3)  Ambien 10 Mg Tabs (Zolpidem Tartrate) .... Take 1 Tablet By Mouth At Bedtime 4)  Plavix 75 Mg  Tabs (Clopidogrel Bisulfate) .... One Daily 5)  Lisinopril 20 Mg Tabs (Lisinopril) .... One By Mouth Two Times A Day 6)  Lopressor 100 Mg Tabs (Metoprolol Tartrate) .... One By Mouth Three Times A Day Per Dr Allyson Sabal 7)  Nitrostat 0.4 Mg  Subl (Nitroglycerin) .... As Directed 8)  Tessalon 200 Mg Caps (Benzonatate) .... One Tab By Mouth Three Times A Day As Needed For Cough 9)  Simvastatin 20 Mg Tabs (Simvastatin) .... Take 1 Tab By Mouth Every Other Night 10)  Ferrous Sulfate 325 (65 Fe) Mg Tabs (Ferrous Sulfate) .... Take 1 Tab By Mouth Two Times A Day 11)  Colace 100 Mg Caps (Docusate Sodium) .... Take 1 Tab By Mouth Two Times A Day 12)  Miralax  Powd (Polyethylene Glycol 3350) .Marland Kitchen.. 1 Capful Two Times A Day Hold For Loose Stool 13)  Lantus Solostar 100 Unit/ml Soln (Insulin Glargine) .... Take 45 Untis Two Times A Day.  Disp 1 Mo Supply 14)  Nortriptyline Hcl 50 Mg Caps (Nortriptyline Hcl) .... Take 1 Cap By Mouth At Bedtime 15)  Hydrochlorothiazide 12.5 Mg Caps (Hydrochlorothiazide) .... Take 1 Tab By Mouth Once Daily 16)  Glipizide Xl 2.5 Mg Xr24h-Tab (Glipizide) .... Take 1 Tab By Mouth Once Daily  Allergies (verified): 1)  ! Augmentin 2)  ! * Contrast Dye  Review of Systems       denies  fever, chills, nausea, vomiting, diarrhea or constipation   Physical Exam  General:  in no distress, vitals reviewed - very hypertensive  Eyes:  No corneal or conjunctival inflammation noted. perrla. EOMI intact Mouth:  MMM uvula midline Lungs:  Normal respiratory effort, chest expands symmetrically. Lungs are clear to auscultation, no crackles or wheezes. Heart:  Normal rate and regular rhythm. S1 and S2 normal without gallop, murmur, click, rub or other extra sounds. Abdomen:  + BS.  NT.  soft,  nondistended.   Msk:  Full ROM of right lower extremitis, no signs of trauma Pulses:  1 + b/l DP Extremities:  no edema.  2+ pulses bilateral lower extremities   Diabetes Management Exam:    Foot Exam (with socks and/or shoes not present):       Sensory-Pinprick/Light touch:          Left medial foot (L-4): normal          Left dorsal foot (L-5): normal          Left lateral foot (S-1): normal          Right medial foot (L-4): normal          Right dorsal foot (L-5): normal          Right lateral foot (S-1): normal       Sensory-Monofilament:          Left foot: normal          Right foot: normal       Inspection:          Left foot: normal          Right foot: normal       Nails:          Left foot: normal          Right foot: normal   Impression & Recommendations:  Problem # 1:  DIABETES MELLITUS, TYPE II, UNCONTROLLED, W/VASCULAR COMPS (ICD-250.72) Pt still seems to not understand how serious this is.  Pt with her CV hx is at a severe risk of sudden death if she des not geet her blood sugar undercontrol.  Pt is showing signs of peripheral neuropathy as well already.  Will increase lantus to 45 units two times a day, pt does not want to injections more than two times a day  and would not use novolog eventhough probably best way to control her diabetes.  Will start glipizde and titrate up to see if we can get better contol.  Gave 10 units of novolg now to help, given red flags to  look for.  Will send to see Dr. Gerilyn Pilgrim to see if we can help with diet control. Will see again in 1-2 weeks.  Her updated medication list for this problem includes:    Aspirin Ec 81 Mg Tbec (Aspirin) .Marland Kitchen... Take 1 tablet by mouth once a day    Lisinopril 20 Mg Tabs (Lisinopril) ..... One by mouth two times a day    Lantus Solostar 100 Unit/ml Soln (Insulin glargine) .Marland Kitchen... Take 45 untis two times a day.  disp 1 mo supply    Glipizide Xl 2.5 Mg Xr24h-tab (Glipizide) .Marland Kitchen... 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  Pt seems to be doing well, would like to avoid though having her get her narcotics at our clinic if possible.   Problem # 3:  HYPERTENSION (ICD-401.9) Controlled, but is having signs of hypotensive episodes, will decrease HCTZ to 12.5 mg If pt has flare of gout though will have to stop medication. Will get BMET today if neet better control if K is fine can increase lisinopril.  The following medications were removed from the medication list:    Hydrochlorothiazide 25 Mg Tabs (Hydrochlorothiazide) .Marland Kitchen... 1 by mouth once daily for blood pressure Her updated medication list for this problem includes:    Lisinopril 20 Mg Tabs (Lisinopril) ..... One by mouth two times a day    Lopressor 100 Mg Tabs (Metoprolol tartrate) ..... One by mouth three times a day per dr berry    Hydrochlorothiazide 12.5 Mg Caps (Hydrochlorothiazide) .Marland Kitchen... Take 1 tab by mouth once daily  Orders: Comp Met-[facility] (96295-28413) [facility]- Est  Level 4 (24401)  Problem # 4:  DIABETIC PERIPHERAL NEUROPATHY (ICD-250.60) Will start nortriptylline and stop amytriptylline to decrease side effect panel and pt has very severe CV  risk as well. Will titrate dose up to 50mg  at bedtime and monitor for improvement.  Her updated medication list for this problem includes:    Aspirin Ec 81 Mg Tbec (Aspirin) .Marland Kitchen... Take 1 tablet by mouth once a day    Lisinopril 20 Mg Tabs (Lisinopril) ..... One by mouth two times a day    Lantus Solostar 100 Unit/ml Soln (Insulin glargine) .Marland Kitchen... Take 45 untis two times a day.  disp 1 mo supply    Glipizide Xl 2.5 Mg Xr24h-tab (Glipizide) .Marland Kitchen... Take 1 tab by mouth once daily  Orders: [facility]- Est  Level 4 (02725)  Complete Medication List: 1)  Lite Touch Lancets Misc (Lancets) .... Use 1 unit as directed twice a day 2)  Aspirin Ec 81 Mg Tbec (Aspirin) .... Take 1 tablet by mouth once a day 3)  Ambien 10 Mg Tabs (Zolpidem tartrate) .... Take 1 tablet by mouth at bedtime 4)  Plavix 75 Mg Tabs (Clopidogrel bisulfate) .... One daily 5)  Lisinopril 20 Mg Tabs (Lisinopril) .... One by mouth two times a day 6)  Lopressor 100 Mg Tabs (Metoprolol tartrate) .... One by mouth three times a day per dr berry  7)  Nitrostat 0.4 Mg Subl (Nitroglycerin) .... As directed 8)  Tessalon 200 Mg Caps (Benzonatate) .... One tab by mouth three times a day as needed for cough 9)  Simvastatin 20 Mg Tabs (Simvastatin) .... Take 1 tab by mouth every other night 10)  Ferrous Sulfate 325 (65 Fe) Mg Tabs (Ferrous sulfate) .... Take 1 tab by mouth two times a day 11)  Colace 100 Mg Caps (Docusate sodium) .... Take 1 tab by mouth two times a day 12)  Miralax Powd (Polyethylene glycol 3350) .Marland Kitchen.. 1 capful two times a day hold for loose stool 13)  Lantus Solostar 100 Unit/ml Soln (Insulin glargine) .... Take 45 untis two times a day.  disp 1 mo supply 14)  Nortriptyline Hcl 50 Mg Caps (Nortriptyline hcl) .... Take 1 cap by mouth at bedtime 15)  Hydrochlorothiazide 12.5 Mg Caps (Hydrochlorothiazide) .... Take 1 tab by mouth once daily 16)  Glipizide Xl 2.5 Mg Xr24h-tab (Glipizide) .... Take 1 tab by mouth once daily  Patient  Instructions: 1)  Good to see you 2)  We have a little work to do on your blood sugar.  3)  Increase your lantus to 45 units two times a day 4)  I am giving you a new medication called glipizide.  Take 1 pill by mouth once daily  5)  STOP the amytryptylline.  6)  Now take Nortriptylline 1 tab by mouth at bedtime  7)  Take 1/2 tab of HCTZ daily  8)  Increase your iron to 1 tab by mouth two times a day  9)  I want to get some labs today as well.  10)  I want you to make an appointment with Dr. Gerilyn Pilgrim about nutrition to help figure out how we can help control the diabetes better 11)  I need to see you again in 1-2 weeks.  Prescriptions: LANTUS SOLOSTAR 100 UNIT/ML SOLN (INSULIN GLARGINE) take 45 untis two times a day.  disp 1 mo supply  #1 month x 3   Entered and Authorized by:   Antoine Primas DO   Signed by:   Antoine Primas DO on 04/13/2010   Method used:   Electronically to        [facility]* (retail)       [address] [place], [place]  [address]       Ph: [phone] or [phone]       Fax: [phone]   RxID:   [id number] GLIPIZIDE XL 2.5 MG XR24H-TAB (GLIPIZIDE) take 1 tab by mouth once daily  #30 x 0   Entered and Authorized by:   Antoine Primas DO   Signed by:   Antoine Primas DO on 04/13/2010   Method used:   Print then Give to Patient   RxID:   [id number] HYDROCHLOROTHIAZIDE 12.5 MG CAPS (HYDROCHLOROTHIAZIDE) take 1 tab by mouth once daily  #90 x 3   Entered and Authorized by:   Antoine Primas DO   Signed by:   Antoine Primas DO on 04/13/2010   Method used:   Print then Give to Patient   RxID:   [id number] NORTRIPTYLINE HCL 50 MG CAPS (NORTRIPTYLINE HCL) take 1 cap by mouth at bedtime  #93 x 1   Entered and Authorized by:   Antoine Primas DO   Signed by:   Antoine Primas DO on 04/13/2010   Method used:   Print then Give to Patient   RxID:   [id number] FERROUS SULFATE  325 (65 FE) MG TABS (FERROUS SULFATE) take 1 tab by mouth two times a  day  #62 x 6   Entered and Authorized by:   Antoine Primas DO   Signed by:   Antoine Primas DO on 04/13/2010   Method used:   Print then Give to Patient   RxID:   [id number] AMBIEN 10 MG TABS (ZOLPIDEM TARTRATE) Take 1 tablet by mouth at bedtime  #15 x 0   Entered and Authorized by:   Antoine Primas DO   Signed by:   Antoine Primas DO on 04/13/2010   Method used:   Print then Give to Patient   RxID:   [id number]   Prevention & Chronic Care Immunizations   Influenza vaccine: Not documented   Influenza vaccine due: 05/13/2010    Tetanus booster: Not documented    Pneumococcal vaccine: Not documented  Other Screening   Pap smear: Not documented    Mammogram: normal  (12/17/2007)   Mammogram due: 12/16/2009   Smoking status: current  (04/13/2010)   Smoking cessation counseling: yes  (02/27/2007)  Diabetes Mellitus   HgbA1C: 12.0  (04/13/2010)   Hemoglobin A1C due: 03/12/2008    Eye exam: Not documented    Foot exam: yes  (04/13/2010)   High risk foot: Not documented   Foot care education: completed  (02/03/2009)   Foot exam due: 05/15/2008    Urine microalbumin/creatinine ratio: Not documented   Urine microalbumin/cr due: Not Indicated    Diabetes flowsheet reviewed?: Yes   Progress toward A1C goal: Unchanged  Lipids   Total Cholesterol: 223  (08/25/2008)   LDL: unable to calculate  (08/25/2008)   LDL Direct: 207  (02/03/2009)   HDL: 27  (08/25/2008)   Triglycerides: 498  (08/25/2008)    SGOT (AST): 7  (12/04/2006)   SGPT (ALT): 13  (12/04/2006) CMP ordered    Alkaline phosphatase: 121  (12/04/2006)   Total bilirubin: 0.2  (12/04/2006)  Hypertension   Last Blood Pressure: 110 / 81  (04/13/2010)   Serum creatinine: .77  (01/20/2009)   Serum potassium 3.4  (01/20/2009) CMP ordered     Hypertension flowsheet reviewed?: Yes   Progress toward BP goal: At goal  Self-Management Support :   Personal Goals (by the next clinic visit) :     Personal  A1C goal: 8  (09/07/2009)     Personal blood pressure goal: 130/80  (09/07/2009)     Personal LDL goal: 100  (09/07/2009)    Diabetes self-management support: Written self-care plan  (09/07/2009)   Last diabetes self-management training by diabetes educator: completed   Referred.    Hypertension self-management support: Written self-care plan  (12/29/2009)    Lipid self-management support: Written self-care plan  (09/07/2009)    Medication Administration  Injection # 1:    Medication: Insulin 5 units    Diagnosis: DIABETES MELLITUS, TYPE II, UNCONTROLLED, W/VASCULAR COMPS (ICD-250.72)    Route: SQ    Site: R arm    Exp Date: 02/11/2011    Lot #: [id number]    Mfr: Novo Nordisk    Comments: Patient recieved 10 Units of Novolog    Patient tolerated injection without complications    Given by: Garen Grams LPN (April 13, 2010 4:43 PM)  Orders Added: 1)  Glucose Cap-[facility] [82948] 2)  A1C-[facility] [83036] 3)  CBC-[facility] [85027] 4)  Comp Met-[facility] [52841-32440] 5)  Insulin 5 units [J1815] 6)  [facility]- Est  Level 4 [10272] 7)  Nutrition Referral [Nutrition]  Laboratory Results   Blood Tests   Date/Time Received: April 13, 2010 4:37 PM  Date/Time Reported: April 13, 2010 5:04 PM   HGBA1C: 12.0%   (Normal Range: Non-Diabetic - 3-6%   Control Diabetic - 6-8%)  Comments: ...........test performed by...........Marland KitchenTerese Door, CMA
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
## 2010-10-14 NOTE — Assessment & Plan Note (Signed)
Summary: DM poor cntrl / JCS   Vital Signs:  Patient profile:   50 year old female Height:      65 inches  Vitals Entered By: Wyona Almas PHD (May 24, 2010 3:37 PM)  Primary Care Provider:  Antoine Primas DO   History of Present Illness: Assessment:  Spent 45 minutes with pt.  Usual eating pattern includes bkfst, lunch, and dinner, and occasional snacks, usually fruit.  Everyday foods/beverages include 16 oz Lipton green tea.   24-hr recall suggests intake of  ~1370 kcal: B (AM)- 1 1/2 c FIber One w/  1 c 2% milk; L (1:30 PM)- 1/2 ham sandwich; Snk (2:30 PM)- 16 oz green tea; D (PM)- 1/2 hamburger w/ let, tom, on, pickles, 1 tbsp mayo, [place] fries, 32 oz sweet tea.  Ms. Keiffer eats out 2 meals per week.  Ms. Avena tries to walk  ~ every other day, 30-35 minutes, but suffers from neuropathies, and her bulging disk also makes it hard.  Uses Miralax about twice a week.  Ms. Kerkman said she is highly motivated to make changes at this time, and is also concerned re. her 10-YO daughter's wt of 165 lb.  Nutrition Diagnosis:  Physical inactivity (NB-2.1) related to neuropathy foot pain as evidenced by pt report of walking only 3-4 X wk.  Inappropriate intake of types of carbohydrate (NI-53.3) related to beverages as evidenced by yesterday's intake of  ~48 oz sweet tea.  Inadequate fruits and veg's.    Intervention: See Patient Instructions.    Monitoring/Eval:  Dietary intake, body weight, and exercise at 3-4-wk F/U.    Allergies: 1)  ! Augmentin 2)  ! * Contrast Dye   Complete Medication List: 1)  Lite Touch Lancets Misc (Lancets) .... Use 1 unit as directed twice a day 2)  Aspirin Ec 81 Mg Tbec (Aspirin) .... Take 1 tablet by mouth once a day 3)  Ambien 10 Mg Tabs (Zolpidem tartrate) .... Take 1 tablet by mouth at bedtime 4)  Plavix 75 Mg Tabs (Clopidogrel bisulfate) .... One daily 5)  Lisinopril 20 Mg Tabs (Lisinopril) .... One by mouth two times a day 6)  Lopressor 100 Mg Tabs  (Metoprolol tartrate) .... One by mouth three times a day per dr berry 7)  Nitrostat 0.4 Mg Subl (Nitroglycerin) .... As directed 8)  Tessalon 200 Mg Caps (Benzonatate) .... One tab by mouth three times a day as needed for cough 9)  Simvastatin 20 Mg Tabs (Simvastatin) .... Take 1 tab by mouth every other night 10)  Ferrous Sulfate 325 (65 Fe) Mg Tabs (Ferrous sulfate) .... Take 1 tab by mouth two times a day 11)  Colace 100 Mg Caps (Docusate sodium) .... Take 1 tab by mouth two times a day 12)  Miralax Powd (Polyethylene glycol 3350) .Marland Kitchen.. 1 capful two times a day hold for loose stool 13)  Lantus Solostar 100 Unit/ml Soln (Insulin glargine) .... Take 45 untis two times a day.  disp 1 mo supply 14)  Nortriptyline Hcl 50 Mg Caps (Nortriptyline hcl) .... Take 1 cap by mouth at bedtime 15)  Hydrochlorothiazide 12.5 Mg Caps (Hydrochlorothiazide) .... Take 1 tab by mouth once daily 16)  Glipizide Xl 2.5 Mg Xr24h-tab (Glipizide) .... Take 1 tab by mouth once daily  Other Orders: [facility]  Patient Instructions: 1)  Switch from sugar-sweetened drinks to either water or to diet drinks.  Try mixing seltzer half-and-half with juice or sweet tea.  Experiment  with making water more appealing, i.e., very cold//with lemon.   2)  TASTE PREFERENCES ARE LEARNED.   3)  Obtain twice as many veg's as protein or carbohydrate foods for both lunch and dinner.  Think about how you can increase the vegetables, i.e., roasted veg's.   4)  Physical activity:  Do what your feet will allow AND be as active as you can be throughout the day, i.e., park far away, stairs vs. elevator, stand up and stretch on the ads when watching TV.   5)  If your fasting blood sugar is above 250, write down what your last meal and snack were, and what time; bring this to your follow-up nutrition appt.   6)  Call Dr. Gerilyn Pilgrim if you have Qs:  [phone].  7)  Please make an appt with Dr. Gerilyn Pilgrim in 3-4 weeks.
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
## 2010-10-14 NOTE — Progress Notes (Signed)
Summary: triage  Phone Note Call from Patient Call back at Home Phone [phone]   Caller: Patient Summary of Call: Thinks she has a kidney infection, sugar way high and she is swollen. Initial call taken by: Clydell Hakim,  April 07, 2010 9:14 AM  Follow-up for Phone Call        swollen x 10 days. has not seen a doctor. meter reads "high". says urine has an unusual smell. smells "loud" . states her legs hurt so bad she can hardly stand up. advised ED now. she refused. states she does not want to wait there all day. Also has no ride. says she missed her appt yesterday because her huisband would not bring her. not sure if she can get a ride today. told her this sounded bad and she needed to be seen now. she refused.  advised drinking lots of water & exercising. states she cannot exercise. A1Cs have been consistantly high.  made appt for tomorrow at 3 with Dr. Sandi Mealy but urged her to reconsider going to ED. emphasized she needed immediate care. she refused Follow-up by: Golden Circle RN,  April 07, 2010 9:20 AM

## 2010-10-14 NOTE — Progress Notes (Signed)
Summary: referral  Phone Note Call from Patient Call back at [phone]   Caller: Patient Summary of Call: wants to know about Pain mgmt referral Initial call taken by: De Nurse,  October 14, 2009 9:23 AM  Follow-up for Phone Call        called [facility] and they are still working on the referral and hopefully will know somehting about an appointment soon. patient notified. Follow-up by: Theresia Lo RN,  October 14, 2009 10:00 AM

## 2010-10-14 NOTE — Progress Notes (Signed)
Summary: referral?  Phone Note Call from Patient Call back at [phone]   Caller: Patient Summary of Call: wants to know about referral Initial call taken by: De Nurse,  September 25, 2009 9:38 AM  Follow-up for Phone Call        spoke with patient and advised that RN was told by PT yesterday that they would be calling her yesterday. gave her the phone number to PT and she will call to follow up.     patient requests referral on Perocet. will forward message to MD. Follow-up by: Theresia Lo RN,  September 25, 2009 9:56 AM  Additional Follow-up for Phone Call Additional follow up Details #1::        See converted flag, lets call [facility] to see where they are in the referral process.   Additional Follow-up by: Rodney Langton MD,  September 25, 2009 10:30 AM    Additional Follow-up for Phone Call Additional follow up Details #2::    spoke with [facility] and was told that her records are still in review process. could be another couple of weeks before she is scheduled.  The referral patient was calling about earlier was about PT referral and phone number was given to patient to call about this referral  . Follow-up by: Theresia Lo RN,  September 25, 2009 11:08 AM  Additional Follow-up for Phone Call Additional follow up Details #3:: Details for Additional Follow-up Action Taken: Noted, thanks, please forward future requests to Dr. Hulen Luster.  Let me know if there is anyting else I can do. Additional Follow-up by: Rodney Langton MD,  September 26, 2009 8:30 PM

## 2010-10-14 NOTE — Miscellaneous (Signed)
  Clinical Lists Changes  Medications: Added new medication of CIPROFLOXACIN HCL 500 MG TABS (CIPROFLOXACIN HCL) 1 tab by mouth two times a day for the next 3 days - Signed Rx of CIPROFLOXACIN HCL 500 MG TABS (CIPROFLOXACIN HCL) 1 tab by mouth two times a day for the next 3 days;  #6 x 1;  Signed;  Entered by: Antoine Primas DO;  Authorized by: Antoine Primas DO;  Method used[facility]  [place] [address] [address], [place], [place]  [address], Ph: [phone] or [phone], Fax: [phone]    Prescriptions: CIPROFLOXACIN HCL 500 MG TABS (CIPROFLOXACIN HCL) 1 tab by mouth two times a day for the next 3 days  #6 x 1   Entered and Authorized by:   Antoine Primas DO   Signed by:   Antoine Primas DO on 09/15/2010   Method used:   [facility]        [facility]  [place] [address] [facility]       [address]       [place], [place]  [address]       Ph: [phone] or [phone]       Fax: [phone]   RxID:   [id number]

## 2010-10-14 NOTE — Progress Notes (Signed)
Summary: refill  Phone Note Refill Request Call back at Home Phone [phone] Message from:  Patient  Refills Requested: Medication #1:  AMBIEN 10 MG TABS Take 1 tablet by mouth at bedtime   Dosage confirmed as above?Dosage Confirmed   Brand Name Necessary? No   Supply Requested: 1 month   Last Refilled: 03/03/2010 states that she takes this "as needed" and is now out, has appt in July but needs refill now. [facility] -   Initial call taken by: De Nurse,  March 03, 2010 2:05 PM  Follow-up for Phone Call        infrmed pt that we will try to fax that in to pharm. Follow-up by: De Nurse,  March 04, 2010 8:53 AM    Prescriptions: AMBIEN 10 MG TABS (ZOLPIDEM TARTRATE) Take 1 tablet by mouth at bedtime  #15 x 0   Entered and Authorized by:   Antoine Primas DO   Signed by:   Antoine Primas DO on 03/03/2010   Method used:   Handwritten   RxID:   [id number]

## 2010-10-14 NOTE — Progress Notes (Signed)
Summary: Refill request, Percocet.  ---- Converted from flag ---- ---- 09/25/2009 9:58 AM, Rodney Langton MD wrote: Rudean Curt cc'ing this to Dr. Hulen Luster,   Elease Hashimoto, she can have a refill but I think I would need to come by and write the script right?  the 60 tabs lasted her almost a month and I am trying to wean her slowly until she can get a pain center appt.  If you are able to refill it without an rx, I would give her 60 tabs again but no refills for a full month and only at the 5/325 dosage (I think shes on 7.5 now). Could you call [facility] to see where they are in her referral?   Fleet Contras, this patient has had a couple pain clinics reject her, still working on [facility] clinic.  Until then I have been weaning her slowly off percocet (see above).  Since this is your pt I just thought you should know.  I will follow along with ya.  -Dr. Karie Schwalbe.  ---- 09/25/2009 9:58 AM, Theresia Lo RN wrote: patient wants refill on Percocet. will you do this or should I send to Dr. Hulen Luster ? Larita Fife ------------------------------  Appended Document: Refill request, Percocet.    Prescriptions: PERCOCET 5-325 MG TABS (OXYCODONE-ACETAMINOPHEN) One tab by mouth no more than 2x a day.  #60 x 0   Entered and Authorized by:   Rodney Langton MD   Signed by:   Rodney Langton MD on 09/28/2009   Method used:   Print then Give to Patient   RxID:   [id number]

## 2010-10-14 NOTE — Miscellaneous (Signed)
Summary: crestor denied  Clinical Lists Changes pharmacist from [facility] called [phone] . medicaid denied the Crestor. must try & fail 2 others. try Lovastatin, pravastatin or simvastatin. plz change rx & re-send.Golden Circle RN  Jan 20, 2010 9:12 AM  Medications: Added new medication of SIMVASTATIN 20 MG TABS (SIMVASTATIN) take 1 tab by mouth QHS - Signed Removed medication of CRESTOR 20 MG TABS (ROSUVASTATIN CALCIUM) One tab by mouth daily Changed medication from SIMVASTATIN 20 MG TABS (SIMVASTATIN) take 1 tab by mouth QHS to SIMVASTATIN 20 MG TABS (SIMVASTATIN) take 1 tab by mouth every other night - Signed Rx of SIMVASTATIN 20 MG TABS (SIMVASTATIN) take 1 tab by mouth QHS;  #31 x 6;  Signed;  Entered by: Antoine Primas DO;  Authorized by: Antoine Primas DO;  Method used: [facility]  [place] [address] [address], [place], [place]  [address], Ph: [phone] or [phone], Fax: [phone] Rx of SIMVASTATIN 20 MG TABS (SIMVASTATIN) take 1 tab by mouth every other night;  #16 x 6;  Signed;  Entered by: Antoine Primas DO;  Authorized by: Antoine Primas DO;  Method used: [facility]  [place] [address] [address], [place], [place]  [address], Ph: [phone] or [phone], Fax: [phone]   Pt was not taking her statin due to myalgia but will take it every other night, felt this is very important to her care.  Please call pt and tell her meds are ready. Prescriptions: SIMVASTATIN 20 MG TABS (SIMVASTATIN) take 1 tab by mouth every other night  #16 x 6   Entered and Authorized by:   Antoine Primas DO   Signed by:   Antoine Primas DO on 01/20/2010   Method used:   [facility]        [facility]  [place] [address]       [address]       [place], [place]  [address]       Ph: [phone] or [phone]       Fax: [phone]   RxID:   [id number] SIMVASTATIN 20 MG TABS (SIMVASTATIN) take 1 tab by mouth QHS  #31 x 6   Entered and Authorized by:    Antoine Primas DO   Signed by:   Antoine Primas DO on 01/20/2010   Method used:   [facility]        [facility]  [place] [address] [facility]       [address]       [place], [place]  [address]       Ph: [phone] or [phone]       Fax: [phone]   RxID:   [id number]    Pt told me in October that Dr. Allyson Sabal manages her cholesterol medication.  She probably has records for this at his office.  Ellery Plunk MD  Jan 20, 2010 1:39 PM  she has a hosp f/u on 17th at 2:30.Golden Circle RN  Jan 20, 2010 3:02 PM

## 2010-10-14 NOTE — Progress Notes (Signed)
Summary: phn msg  Phone Note Call from Patient Call back at Home Phone [phone]   Caller: Patient Summary of Call: needs to talk to nurse about her sugar  also all meds that were called in needed to go to [facility] Initial call taken by: De Nurse,  July 09, 2010 1:48 PM  Follow-up for Phone Call        Patient called back regarding previous flag, states blood sugar is still above 600 and she feels "ok" just tired and she has taken her insulin this morning. Told her I would let MD know. Scripts resent to [facility] Follow-up by: Garen Grams LPN,  July 09, 2010 2:21 PM  Additional Follow-up for Phone Call Additional follow up Details #1::        Called pt gave her red flags to look out for and when she should seek medical attention.  Told pt I would like to see hre in the next 2 weeks to make sure we are making an improvement and to keep a log of her sugars.  Additional Follow-up by: Antoine Primas DO,  July 09, 2010 2:53 PM    Prescriptions: SIMVASTATIN 20 MG TABS (SIMVASTATIN) take 1 tab by mouth at bedtime  #90 x 1   Entered by:   Garen Grams LPN   Authorized by:   Antoine Primas DO   Signed by:   Garen Grams LPN on 14/78/2956   Method used:   [facility] to        [facility] [address] [facility]ail[facility]       [address]       [place], [place]  [address]       Ph: [phone] or [phone]       Fax: [phone]   RxID:   [id number] GLIPIZIDE 5 MG XR24H-TAB (GLIPIZIDE) 1 tab by mouth daily  #93 x 3   Entered by:   Garen Grams LPN   Authorized by:   Antoine Primas DO   Signed by:   Garen Grams LPN on 43/32/9518   Method used:   [facility]ically [facility]        [facility] [address]       [address]       [place], [place]  [address]       Ph: [phone] or [phone]       Fax: [phone]   RxID:   [id number] LANTUS SOLOSTAR 100 UNIT/ML SOLN (INSULIN GLARGINE) take 50 untis two times a day.  disp 1 mo supply  #1 month x 3  Entered by:   Garen Grams LPN   Authorized by:   Antoine Primas DO   Signed by:   Garen Grams LPN on 03/07/9484   Method used:   [facility]ically [facility]        [facility] [address] ([facility]       [address]       [place], [place]  [address]       Ph: [phone] or [phone]       Fax: [phone]   RxID:   [id number] LISINOPRIL 20 MG TABS (LISINOPRIL) one by mouth once daily  #93 x 1   Entered by:   Garen Grams LPN   Authorized by:   Antoine Primas DO   Signed by:   Garen Grams LPN on 44/31/5400   Method used:   [facility]ronically to        [facility] [address] (retail)       [address]       [place], [place]  [address]       Ph: [phone] or [phone]       Fax: [phone]   RxID:   [id number]

## 2010-10-28 NOTE — Consult Note (Signed)
Summary: [facility]   Imported By: De Nurse 10/19/2010 10:57:40  _____________________________________________________________________  External Attachment:    Type:   Image     Comment:   External Document

## 2010-11-22 LAB — GLUCOSE, CAPILLARY: Glucose-Capillary: 449 mg/dL — ABNORMAL HIGH (ref 70–99)

## 2010-11-26 LAB — GLUCOSE, CAPILLARY

## 2010-11-30 LAB — LIPID PANEL
LDL Cholesterol: UNDETERMINED mg/dL (ref 0–99)
Total CHOL/HDL Ratio: 8.4 RATIO
Triglycerides: 674 mg/dL — ABNORMAL HIGH (ref <150)
VLDL: UNDETERMINED mg/dL (ref 0–40)

## 2010-11-30 LAB — BASIC METABOLIC PANEL
BUN: 10 mg/dL (ref 6–23)
CO2: 24 mEq/L (ref 19–32)
CO2: 26 mEq/L (ref 19–32)
CO2: 31 mEq/L (ref 19–32)
Calcium: 8.2 mg/dL — ABNORMAL LOW (ref 8.4–10.5)
Calcium: 8.9 mg/dL (ref 8.4–10.5)
Chloride: 102 mEq/L (ref 96–112)
Chloride: 102 mEq/L (ref 96–112)
Chloride: 103 mEq/L (ref 96–112)
Chloride: 105 mEq/L (ref 96–112)
Creatinine, Ser: 0.77 mg/dL (ref 0.4–1.2)
Creatinine, Ser: 0.86 mg/dL (ref 0.4–1.2)
GFR calc Af Amer: >60 mL/min (ref >60)
GFR calc Af Amer: >60 mL/min (ref >60)
GFR calc non Af Amer: >60 mL/min (ref >60)
GFR calc non Af Amer: >60 mL/min (ref >60)
GFR calc non Af Amer: >60 mL/min (ref >60)
Glucose, Bld: 126 mg/dL — ABNORMAL HIGH (ref 70–99)
Glucose, Bld: 178 mg/dL — ABNORMAL HIGH (ref 70–99)
Glucose, Bld: 302 mg/dL — ABNORMAL HIGH (ref 70–99)
Potassium: 3.8 mEq/L (ref 3.5–5.1)
Potassium: 4 mEq/L (ref 3.5–5.1)
Potassium: 4.1 mEq/L (ref 3.5–5.1)
Potassium: 4.1 mEq/L (ref 3.5–5.1)
Potassium: 4.2 mEq/L (ref 3.5–5.1)
Sodium: 134 mEq/L — ABNORMAL LOW (ref 135–145)
Sodium: 138 mEq/L (ref 135–145)
Sodium: 139 mEq/L (ref 135–145)
Sodium: 140 mEq/L (ref 135–145)

## 2010-11-30 LAB — GLUCOSE, CAPILLARY
Glucose-Capillary: 104 mg/dL — ABNORMAL HIGH (ref 70–99)
Glucose-Capillary: 107 mg/dL — ABNORMAL HIGH (ref 70–99)
Glucose-Capillary: 157 mg/dL — ABNORMAL HIGH (ref 70–99)
Glucose-Capillary: 164 mg/dL — ABNORMAL HIGH (ref 70–99)
Glucose-Capillary: 181 mg/dL — ABNORMAL HIGH (ref 70–99)
Glucose-Capillary: 186 mg/dL — ABNORMAL HIGH (ref 70–99)
Glucose-Capillary: 189 mg/dL — ABNORMAL HIGH (ref 70–99)
Glucose-Capillary: 238 mg/dL — ABNORMAL HIGH (ref 70–99)
Glucose-Capillary: 239 mg/dL — ABNORMAL HIGH (ref 70–99)
Glucose-Capillary: 283 mg/dL — ABNORMAL HIGH (ref 70–99)
Glucose-Capillary: 286 mg/dL — ABNORMAL HIGH (ref 70–99)
Glucose-Capillary: 293 mg/dL — ABNORMAL HIGH (ref 70–99)
Glucose-Capillary: 326 mg/dL — ABNORMAL HIGH (ref 70–99)
Glucose-Capillary: 57 mg/dL — ABNORMAL LOW (ref 70–99)

## 2010-11-30 LAB — APTT: aPTT: 27 seconds (ref 24–37)

## 2010-11-30 LAB — COMPREHENSIVE METABOLIC PANEL
ALT: 10 U/L (ref 0–35)
ALT: 12 U/L (ref 0–35)
Alkaline Phosphatase: 159 U/L — ABNORMAL HIGH (ref 39–117)
Alkaline Phosphatase: 162 U/L — ABNORMAL HIGH (ref 39–117)
BUN: 10 mg/dL (ref 6–23)
CO2: 24 mEq/L (ref 19–32)
Calcium: 8.6 mg/dL (ref 8.4–10.5)
Chloride: 108 mEq/L (ref 96–112)
Chloride: 97 mEq/L (ref 96–112)
GFR calc non Af Amer: >60 mL/min (ref >60)
Glucose, Bld: 153 mg/dL — ABNORMAL HIGH (ref 70–99)
Glucose, Bld: 458 mg/dL — ABNORMAL HIGH (ref 70–99)
Potassium: 3.9 mEq/L (ref 3.5–5.1)
Potassium: 4.1 mEq/L (ref 3.5–5.1)
Sodium: 134 mEq/L — ABNORMAL LOW (ref 135–145)
Sodium: 140 mEq/L (ref 135–145)
Total Bilirubin: 0.3 mg/dL (ref 0.3–1.2)
Total Bilirubin: 0.4 mg/dL (ref 0.3–1.2)
Total Protein: 6.9 g/dL (ref 6.0–8.3)

## 2010-11-30 LAB — CBC
HCT: 30.6 % — ABNORMAL LOW (ref 36.0–46.0)
HCT: 30.8 % — ABNORMAL LOW (ref 36.0–46.0)
HCT: 31.6 % — ABNORMAL LOW (ref 36.0–46.0)
HCT: 32.2 % — ABNORMAL LOW (ref 36.0–46.0)
HCT: 32.5 % — ABNORMAL LOW (ref 36.0–46.0)
HCT: 34.7 % — ABNORMAL LOW (ref 36.0–46.0)
Hemoglobin: 10.5 g/dL — ABNORMAL LOW (ref 12.0–15.0)
Hemoglobin: 10.6 g/dL — ABNORMAL LOW (ref 12.0–15.0)
Hemoglobin: 10.8 g/dL — ABNORMAL LOW (ref 12.0–15.0)
Hemoglobin: 10.8 g/dL — ABNORMAL LOW (ref 12.0–15.0)
Hemoglobin: 10.9 g/dL — ABNORMAL LOW (ref 12.0–15.0)
Hemoglobin: 11 g/dL — ABNORMAL LOW (ref 12.0–15.0)
Hemoglobin: 11.8 g/dL — ABNORMAL LOW (ref 12.0–15.0)
MCHC: 33.2 g/dL (ref 30.0–36.0)
MCHC: 34 g/dL (ref 30.0–36.0)
MCHC: 34.1 g/dL (ref 30.0–36.0)
MCHC: 34.3 g/dL (ref 30.0–36.0)
MCHC: 34.5 g/dL (ref 30.0–36.0)
MCV: 85.3 fL (ref 78.0–100.0)
MCV: 85.5 fL (ref 78.0–100.0)
MCV: 86.6 fL (ref 78.0–100.0)
Platelets: 316 10*3/uL (ref 150–400)
Platelets: 356 10*3/uL (ref 150–400)
RBC: 3.58 MIL/uL — ABNORMAL LOW (ref 3.87–5.11)
RBC: 3.69 MIL/uL — ABNORMAL LOW (ref 3.87–5.11)
RBC: 3.73 MIL/uL — ABNORMAL LOW (ref 3.87–5.11)
RBC: 3.74 MIL/uL — ABNORMAL LOW (ref 3.87–5.11)
RBC: 4.06 MIL/uL (ref 3.87–5.11)
RDW: 13.5 % (ref 11.5–15.5)
RDW: 13.5 % (ref 11.5–15.5)
RDW: 13.5 % (ref 11.5–15.5)
RDW: 13.7 % (ref 11.5–15.5)
WBC: 7.5 10*3/uL (ref 4.0–10.5)
WBC: 8.2 10*3/uL (ref 4.0–10.5)
WBC: 8.6 10*3/uL (ref 4.0–10.5)
WBC: 9 10*3/uL (ref 4.0–10.5)

## 2010-11-30 LAB — HEPARIN LEVEL (UNFRACTIONATED)
Heparin Unfractionated: 0.13 IU/mL — ABNORMAL LOW (ref 0.30–0.70)
Heparin Unfractionated: 0.24 IU/mL — ABNORMAL LOW (ref 0.30–0.70)
Heparin Unfractionated: 0.32 IU/mL (ref 0.30–0.70)
Heparin Unfractionated: <0.1 IU/mL — ABNORMAL LOW (ref 0.30–0.70)
Heparin Unfractionated: <0.1 IU/mL — ABNORMAL LOW (ref 0.30–0.70)
Heparin Unfractionated: <0.1 IU/mL — ABNORMAL LOW (ref 0.30–0.70)

## 2010-11-30 LAB — POCT I-STAT, CHEM 8
BUN: 12 mg/dL (ref 6–23)
Chloride: 102 mEq/L (ref 96–112)
Creatinine, Ser: 0.6 mg/dL (ref 0.4–1.2)
Glucose, Bld: 488 mg/dL — ABNORMAL HIGH (ref 70–99)
Potassium: 4.3 mEq/L (ref 3.5–5.1)
Sodium: 135 mEq/L (ref 135–145)

## 2010-11-30 LAB — CK TOTAL AND CKMB (NOT AT ARMC): Total CK: 42 U/L (ref 7–177)

## 2010-11-30 LAB — URINALYSIS, ROUTINE W REFLEX MICROSCOPIC
Glucose, UA: >1000 mg/dL — AB
Ketones, ur: NEGATIVE mg/dL
Protein, ur: NEGATIVE mg/dL
Urobilinogen, UA: 0.2 mg/dL (ref 0.0–1.0)

## 2010-11-30 LAB — CARDIAC PANEL(CRET KIN+CKTOT+MB+TROPI)
CK, MB: 1 ng/mL (ref 0.3–4.0)
Relative Index: INVALID (ref 0.0–2.5)
Total CK: 31 U/L (ref 7–177)
Total CK: 32 U/L (ref 7–177)
Troponin I: 0.02 ng/mL (ref 0.00–0.06)

## 2010-11-30 LAB — URINE MICROSCOPIC-ADD ON

## 2010-11-30 LAB — HEMOGLOBIN A1C
Hgb A1c MFr Bld: 15.7 % — ABNORMAL HIGH (ref <5.7)
Hgb A1c MFr Bld: 18.6 % — ABNORMAL HIGH (ref <5.7)
Mean Plasma Glucose: 487 mg/dL — ABNORMAL HIGH (ref <117)

## 2010-11-30 LAB — MRSA PCR SCREENING: MRSA by PCR: NEGATIVE

## 2010-11-30 LAB — DIFFERENTIAL
Basophils Absolute: 0 10*3/uL (ref 0.0–0.1)
Basophils Relative: 0 % (ref 0–1)
Eosinophils Absolute: 0.1 10*3/uL (ref 0.0–0.7)
Monocytes Absolute: 0.4 10*3/uL (ref 0.1–1.0)
Monocytes Relative: 4 % (ref 3–12)
Neutro Abs: 5.7 10*3/uL (ref 1.7–7.7)
Neutrophils Relative %: 66 % (ref 43–77)

## 2010-11-30 LAB — URINE CULTURE: Colony Count: >=100000

## 2010-11-30 LAB — PROTIME-INR: INR: 0.91 (ref 0.00–1.49)

## 2010-12-15 LAB — GLUCOSE, CAPILLARY
Glucose-Capillary: 127 mg/dL — ABNORMAL HIGH (ref 70–99)
Glucose-Capillary: 216 mg/dL — ABNORMAL HIGH (ref 70–99)
Glucose-Capillary: 219 mg/dL — ABNORMAL HIGH (ref 70–99)
Glucose-Capillary: 226 mg/dL — ABNORMAL HIGH (ref 70–99)
Glucose-Capillary: 237 mg/dL — ABNORMAL HIGH (ref 70–99)
Glucose-Capillary: 239 mg/dL — ABNORMAL HIGH (ref 70–99)
Glucose-Capillary: 308 mg/dL — ABNORMAL HIGH (ref 70–99)
Glucose-Capillary: 449 mg/dL — ABNORMAL HIGH (ref 70–99)

## 2010-12-15 LAB — CBC
HCT: 33.5 % — ABNORMAL LOW (ref 36.0–46.0)
HCT: 34.7 % — ABNORMAL LOW (ref 36.0–46.0)
HCT: 36.2 % (ref 36.0–46.0)
Hemoglobin: 11.6 g/dL — ABNORMAL LOW (ref 12.0–15.0)
Hemoglobin: 11.7 g/dL — ABNORMAL LOW (ref 12.0–15.0)
Hemoglobin: 12.1 g/dL (ref 12.0–15.0)
MCHC: 34.3 g/dL (ref 30.0–36.0)
MCHC: 34.6 g/dL (ref 30.0–36.0)
MCHC: 34.7 g/dL (ref 30.0–36.0)
MCHC: 34.8 g/dL (ref 30.0–36.0)
MCV: 83.9 fL (ref 78.0–100.0)
MCV: 85 fL (ref 78.0–100.0)
MCV: 85 fL (ref 78.0–100.0)
MCV: 85.8 fL (ref 78.0–100.0)
Platelets: 287 10*3/uL (ref 150–400)
Platelets: 345 10*3/uL (ref 150–400)
Platelets: 348 10*3/uL (ref 150–400)
RDW: 13.9 % (ref 11.5–15.5)
RDW: 14.5 % (ref 11.5–15.5)
RDW: 14.6 % (ref 11.5–15.5)
WBC: 10 10*3/uL (ref 4.0–10.5)
WBC: 14.1 10*3/uL — ABNORMAL HIGH (ref 4.0–10.5)

## 2010-12-15 LAB — BASIC METABOLIC PANEL
BUN: 17 mg/dL (ref 6–23)
BUN: 25 mg/dL — ABNORMAL HIGH (ref 6–23)
BUN: 26 mg/dL — ABNORMAL HIGH (ref 6–23)
CO2: 24 mEq/L (ref 19–32)
CO2: 25 mEq/L (ref 19–32)
Calcium: 8.5 mg/dL (ref 8.4–10.5)
Chloride: 103 mEq/L (ref 96–112)
Chloride: 105 mEq/L (ref 96–112)
Creatinine, Ser: 0.83 mg/dL (ref 0.4–1.2)
Creatinine, Ser: 0.85 mg/dL (ref 0.4–1.2)
GFR calc non Af Amer: >60 mL/min (ref >60)
GFR calc non Af Amer: >60 mL/min (ref >60)
Glucose, Bld: 191 mg/dL — ABNORMAL HIGH (ref 70–99)
Glucose, Bld: 247 mg/dL — ABNORMAL HIGH (ref 70–99)
Potassium: 4.3 mEq/L (ref 3.5–5.1)
Sodium: 137 mEq/L (ref 135–145)

## 2010-12-15 LAB — GLUCOSE, RANDOM: Glucose, Bld: 420 mg/dL — ABNORMAL HIGH (ref 70–99)

## 2010-12-16 LAB — GLUCOSE, CAPILLARY
Glucose-Capillary: 134 mg/dL — ABNORMAL HIGH (ref 70–99)
Glucose-Capillary: 179 mg/dL — ABNORMAL HIGH (ref 70–99)
Glucose-Capillary: 200 mg/dL — ABNORMAL HIGH (ref 70–99)
Glucose-Capillary: 203 mg/dL — ABNORMAL HIGH (ref 70–99)
Glucose-Capillary: 204 mg/dL — ABNORMAL HIGH (ref 70–99)
Glucose-Capillary: 237 mg/dL — ABNORMAL HIGH (ref 70–99)
Glucose-Capillary: 241 mg/dL — ABNORMAL HIGH (ref 70–99)
Glucose-Capillary: 247 mg/dL — ABNORMAL HIGH (ref 70–99)
Glucose-Capillary: 316 mg/dL — ABNORMAL HIGH (ref 70–99)
Glucose-Capillary: 330 mg/dL — ABNORMAL HIGH (ref 70–99)

## 2010-12-16 LAB — TROPONIN I
Troponin I: 0.01 ng/mL (ref 0.00–0.06)
Troponin I: 0.02 ng/mL (ref 0.00–0.06)

## 2010-12-16 LAB — URINALYSIS, ROUTINE W REFLEX MICROSCOPIC
Bilirubin Urine: NEGATIVE
Hgb urine dipstick: NEGATIVE
Specific Gravity, Urine: 1.023 (ref 1.005–1.030)
Urobilinogen, UA: 0.2 mg/dL (ref 0.0–1.0)

## 2010-12-16 LAB — HEMOGLOBIN A1C
Hgb A1c MFr Bld: 11.7 % — ABNORMAL HIGH (ref 4.6–6.1)
Mean Plasma Glucose: 289 mg/dL

## 2010-12-16 LAB — CARDIAC PANEL(CRET KIN+CKTOT+MB+TROPI)
CK, MB: 0.5 ng/mL (ref 0.3–4.0)
CK, MB: 0.6 ng/mL (ref 0.3–4.0)
CK, MB: 0.7 ng/mL (ref 0.3–4.0)
Relative Index: INVALID (ref 0.0–2.5)
Relative Index: INVALID (ref 0.0–2.5)
Relative Index: INVALID (ref 0.0–2.5)
Total CK: 38 U/L (ref 7–177)
Total CK: 40 U/L (ref 7–177)
Total CK: 61 U/L (ref 7–177)
Troponin I: 0.02 ng/mL (ref 0.00–0.06)
Troponin I: 0.02 ng/mL (ref 0.00–0.06)
Troponin I: 0.03 ng/mL (ref 0.00–0.06)

## 2010-12-16 LAB — COMPREHENSIVE METABOLIC PANEL
ALT: 10 U/L (ref 0–35)
AST: 8 U/L (ref 0–37)
Albumin: 3.4 g/dL — ABNORMAL LOW (ref 3.5–5.2)
Alkaline Phosphatase: 93 U/L (ref 39–117)
CO2: 17 mEq/L — ABNORMAL LOW (ref 19–32)
Chloride: 100 mEq/L (ref 96–112)
GFR calc Af Amer: 50 mL/min — ABNORMAL LOW (ref >60)
Potassium: 4 mEq/L (ref 3.5–5.1)
Total Bilirubin: 0.4 mg/dL (ref 0.3–1.2)

## 2010-12-16 LAB — BASIC METABOLIC PANEL
BUN: 10 mg/dL (ref 6–23)
BUN: 12 mg/dL (ref 6–23)
BUN: 15 mg/dL (ref 6–23)
CO2: 22 mEq/L (ref 19–32)
CO2: 22 mEq/L (ref 19–32)
Calcium: 8.3 mg/dL — ABNORMAL LOW (ref 8.4–10.5)
Chloride: 102 mEq/L (ref 96–112)
Chloride: 103 mEq/L (ref 96–112)
Chloride: 111 mEq/L (ref 96–112)
Creatinine, Ser: 0.71 mg/dL (ref 0.4–1.2)
Creatinine, Ser: 0.82 mg/dL (ref 0.4–1.2)
GFR calc Af Amer: >60 mL/min (ref >60)
GFR calc non Af Amer: >60 mL/min (ref >60)
Glucose, Bld: 160 mg/dL — ABNORMAL HIGH (ref 70–99)
Glucose, Bld: 58 mg/dL — ABNORMAL LOW (ref 70–99)
Potassium: 3.3 mEq/L — ABNORMAL LOW (ref 3.5–5.1)
Potassium: 3.7 mEq/L (ref 3.5–5.1)
Potassium: 4 mEq/L (ref 3.5–5.1)
Sodium: 132 mEq/L — ABNORMAL LOW (ref 135–145)

## 2010-12-16 LAB — CK TOTAL AND CKMB (NOT AT ARMC)
CK, MB: 0.8 ng/mL (ref 0.3–4.0)
CK, MB: 1.2 ng/mL (ref 0.3–4.0)
Relative Index: INVALID (ref 0.0–2.5)
Total CK: 51 U/L (ref 7–177)

## 2010-12-16 LAB — CBC
HCT: 32.6 % — ABNORMAL LOW (ref 36.0–46.0)
HCT: 33.3 % — ABNORMAL LOW (ref 36.0–46.0)
HCT: 34.5 % — ABNORMAL LOW (ref 36.0–46.0)
HCT: 35.5 % — ABNORMAL LOW (ref 36.0–46.0)
Hemoglobin: 11.5 g/dL — ABNORMAL LOW (ref 12.0–15.0)
Hemoglobin: 11.9 g/dL — ABNORMAL LOW (ref 12.0–15.0)
MCHC: 34.5 g/dL (ref 30.0–36.0)
MCHC: 34.7 g/dL (ref 30.0–36.0)
MCV: 85.4 fL (ref 78.0–100.0)
MCV: 85.8 fL (ref 78.0–100.0)
MCV: 85.8 fL (ref 78.0–100.0)
MCV: 85.9 fL (ref 78.0–100.0)
Platelets: 221 10*3/uL (ref 150–400)
Platelets: 227 10*3/uL (ref 150–400)
Platelets: 236 K/uL (ref 150–400)
RBC: 3.88 MIL/uL (ref 3.87–5.11)
RDW: 14.2 % (ref 11.5–15.5)
RDW: 14.2 % (ref 11.5–15.5)
RDW: 14.3 % (ref 11.5–15.5)
WBC: 6.6 10*3/uL (ref 4.0–10.5)
WBC: 7.6 10*3/uL (ref 4.0–10.5)
WBC: 8 10*3/uL (ref 4.0–10.5)

## 2010-12-16 LAB — RAPID URINE DRUG SCREEN, HOSP PERFORMED
Barbiturates: NOT DETECTED
Benzodiazepines: NOT DETECTED

## 2010-12-16 LAB — DIFFERENTIAL
Basophils Absolute: 0.1 10*3/uL (ref 0.0–0.1)
Basophils Relative: 1 % (ref 0–1)
Eosinophils Absolute: 0.1 10*3/uL (ref 0.0–0.7)
Eosinophils Relative: 1 % (ref 0–5)
Monocytes Absolute: 0.3 10*3/uL (ref 0.1–1.0)

## 2010-12-16 LAB — URINE CULTURE: Colony Count: >=100000

## 2010-12-16 LAB — HEPATIC FUNCTION PANEL
ALT: 14 U/L (ref 0–35)
AST: 13 U/L (ref 0–37)
Albumin: 3 g/dL — ABNORMAL LOW (ref 3.5–5.2)
Alkaline Phosphatase: 87 U/L (ref 39–117)
Bilirubin, Direct: <0.1 mg/dL (ref 0.0–0.3)
Total Bilirubin: 0.4 mg/dL (ref 0.3–1.2)
Total Protein: 5.7 g/dL — ABNORMAL LOW (ref 6.0–8.3)

## 2010-12-16 LAB — MRSA PCR SCREENING: MRSA by PCR: NEGATIVE

## 2010-12-16 LAB — POCT CARDIAC MARKERS
CKMB, poc: <1 ng/mL — ABNORMAL LOW (ref 1.0–8.0)
Myoglobin, poc: 112 ng/mL (ref 12–200)

## 2010-12-16 LAB — BASIC METABOLIC PANEL WITH GFR
BUN: 19 mg/dL (ref 6–23)
GFR calc non Af Amer: >60 mL/min (ref >60)
Glucose, Bld: 241 mg/dL — ABNORMAL HIGH (ref 70–99)
Potassium: 3.1 meq/L — ABNORMAL LOW (ref 3.5–5.1)

## 2010-12-16 LAB — LIPID PANEL
Cholesterol: 277 mg/dL — ABNORMAL HIGH (ref 0–200)
LDL Cholesterol: 168 mg/dL — ABNORMAL HIGH (ref 0–99)
VLDL: 76 mg/dL — ABNORMAL HIGH (ref 0–40)

## 2010-12-16 LAB — URINE MICROSCOPIC-ADD ON

## 2010-12-16 LAB — PROTIME-INR
INR: 0.89 (ref 0.00–1.49)
Prothrombin Time: 12 s (ref 11.6–15.2)

## 2010-12-16 LAB — LACTIC ACID, PLASMA: Lactic Acid, Venous: 1.5 mmol/L (ref 0.5–2.2)

## 2010-12-17 LAB — BASIC METABOLIC PANEL
BUN: 18 mg/dL (ref 6–23)
Calcium: 9.7 mg/dL (ref 8.4–10.5)
GFR calc non Af Amer: >60 mL/min (ref >60)
Potassium: 4.4 mEq/L (ref 3.5–5.1)
Sodium: 137 mEq/L (ref 135–145)

## 2010-12-17 LAB — URINALYSIS, ROUTINE W REFLEX MICROSCOPIC
Glucose, UA: 500 mg/dL — AB
Ketones, ur: NEGATIVE mg/dL
Leukocytes, UA: NEGATIVE
Nitrite: POSITIVE — AB
Specific Gravity, Urine: >1.03 — ABNORMAL HIGH (ref 1.005–1.030)
pH: 5.5 (ref 5.0–8.0)

## 2010-12-17 LAB — CBC
HCT: 40 % (ref 36.0–46.0)
Platelets: 381 10*3/uL (ref 150–400)
WBC: 7.9 10*3/uL (ref 4.0–10.5)

## 2010-12-17 LAB — DIFFERENTIAL
Eosinophils Relative: 1 % (ref 0–5)
Lymphocytes Relative: 21 % (ref 12–46)
Lymphs Abs: 1.7 10*3/uL (ref 0.7–4.0)
Neutro Abs: 5.7 10*3/uL (ref 1.7–7.7)

## 2010-12-17 LAB — URINE MICROSCOPIC-ADD ON

## 2010-12-17 LAB — URINE CULTURE

## 2010-12-17 LAB — ETHANOL: Alcohol, Ethyl (B): <5 mg/dL (ref 0–10)

## 2010-12-17 LAB — RAPID URINE DRUG SCREEN, HOSP PERFORMED: Tetrahydrocannabinol: NOT DETECTED

## 2010-12-19 LAB — BASIC METABOLIC PANEL
Chloride: 103 mEq/L (ref 96–112)
GFR calc Af Amer: >60 mL/min (ref >60)
GFR calc non Af Amer: >60 mL/min (ref >60)
Potassium: 4.8 mEq/L (ref 3.5–5.1)
Sodium: 136 mEq/L (ref 135–145)

## 2010-12-19 LAB — CBC
HCT: 39.9 % (ref 36.0–46.0)
Hemoglobin: 13.5 g/dL (ref 12.0–15.0)
MCV: 86.1 fL (ref 78.0–100.0)
Platelets: 330 10*3/uL (ref 150–400)
RBC: 4.64 MIL/uL (ref 3.87–5.11)
WBC: 10.2 10*3/uL (ref 4.0–10.5)

## 2010-12-19 LAB — DIFFERENTIAL
Eosinophils Absolute: 0.3 10*3/uL (ref 0.0–0.7)
Eosinophils Relative: 3 % (ref 0–5)
Lymphocytes Relative: 39 % (ref 12–46)
Lymphs Abs: 4 10*3/uL (ref 0.7–4.0)
Monocytes Absolute: 0.4 10*3/uL (ref 0.1–1.0)
Monocytes Relative: 4 % (ref 3–12)

## 2010-12-19 LAB — GLUCOSE, CAPILLARY: Glucose-Capillary: 256 mg/dL — ABNORMAL HIGH (ref 70–99)

## 2010-12-20 LAB — CBC
MCHC: 33.8 g/dL (ref 30.0–36.0)
MCHC: 34 g/dL (ref 30.0–36.0)
RBC: 4.29 MIL/uL (ref 3.87–5.11)
RBC: 4.65 MIL/uL (ref 3.87–5.11)
RDW: 14 % (ref 11.5–15.5)
WBC: 9.8 10*3/uL (ref 4.0–10.5)

## 2010-12-20 LAB — GLUCOSE, CAPILLARY
Glucose-Capillary: 126 mg/dL — ABNORMAL HIGH (ref 70–99)
Glucose-Capillary: 54 mg/dL — ABNORMAL LOW (ref 70–99)

## 2010-12-20 LAB — CARDIAC PANEL(CRET KIN+CKTOT+MB+TROPI)
CK, MB: 0.9 ng/mL (ref 0.3–4.0)
Relative Index: INVALID (ref 0.0–2.5)
Total CK: 27 U/L (ref 7–177)

## 2010-12-20 LAB — BASIC METABOLIC PANEL
CO2: 24 mEq/L (ref 19–32)
Calcium: 9.1 mg/dL (ref 8.4–10.5)
Creatinine, Ser: 0.93 mg/dL (ref 0.4–1.2)
GFR calc Af Amer: >60 mL/min (ref >60)

## 2010-12-20 LAB — CK TOTAL AND CKMB (NOT AT ARMC)
CK, MB: 1.1 ng/mL (ref 0.3–4.0)
Relative Index: INVALID (ref 0.0–2.5)
Total CK: 21 U/L (ref 7–177)

## 2010-12-20 LAB — HEPARIN LEVEL (UNFRACTIONATED)
Heparin Unfractionated: 0.26 IU/mL — ABNORMAL LOW (ref 0.30–0.70)
Heparin Unfractionated: 0.44 IU/mL (ref 0.30–0.70)

## 2010-12-20 LAB — BRAIN NATRIURETIC PEPTIDE: Pro B Natriuretic peptide (BNP): <30 pg/mL (ref 0.0–100.0)

## 2010-12-20 LAB — TSH: TSH: 0.958 u[IU]/mL (ref 0.350–4.500)

## 2010-12-20 LAB — COMPREHENSIVE METABOLIC PANEL
Albumin: 4 g/dL (ref 3.5–5.2)
BUN: 22 mg/dL (ref 6–23)
Calcium: 9.6 mg/dL (ref 8.4–10.5)
Glucose, Bld: 257 mg/dL — ABNORMAL HIGH (ref 70–99)
Sodium: 139 mEq/L (ref 135–145)
Total Protein: 7.2 g/dL (ref 6.0–8.3)

## 2010-12-20 LAB — PROTIME-INR
INR: 0.9 (ref 0.00–1.49)
Prothrombin Time: 12 seconds (ref 11.6–15.2)

## 2010-12-20 LAB — LIPID PANEL
Cholesterol: 297 mg/dL — ABNORMAL HIGH (ref 0–200)
LDL Cholesterol: UNDETERMINED mg/dL (ref 0–99)
Total CHOL/HDL Ratio: 6.8 RATIO

## 2010-12-20 LAB — APTT: aPTT: 26 seconds (ref 24–37)

## 2010-12-20 LAB — DIFFERENTIAL
Lymphs Abs: 2.9 10*3/uL (ref 0.7–4.0)
Monocytes Relative: 3 % (ref 3–12)
Neutro Abs: 6.6 10*3/uL (ref 1.7–7.7)
Neutrophils Relative %: 66 % (ref 43–77)

## 2010-12-20 LAB — TROPONIN I: Troponin I: 0.04 ng/mL (ref 0.00–0.06)

## 2010-12-21 LAB — GLUCOSE, CAPILLARY
Glucose-Capillary: 128 mg/dL — ABNORMAL HIGH (ref 70–99)
Glucose-Capillary: 147 mg/dL — ABNORMAL HIGH (ref 70–99)
Glucose-Capillary: 261 mg/dL — ABNORMAL HIGH (ref 70–99)
Glucose-Capillary: 30 mg/dL — CL (ref 70–99)
Glucose-Capillary: 320 mg/dL — ABNORMAL HIGH (ref 70–99)
Glucose-Capillary: 374 mg/dL — ABNORMAL HIGH (ref 70–99)
Glucose-Capillary: 474 mg/dL — ABNORMAL HIGH (ref 70–99)
Glucose-Capillary: 478 mg/dL — ABNORMAL HIGH (ref 70–99)
Glucose-Capillary: 99 mg/dL (ref 70–99)

## 2010-12-21 LAB — CBC
HCT: 33.8 % — ABNORMAL LOW (ref 36.0–46.0)
HCT: 36 % (ref 36.0–46.0)
HCT: 40.6 % (ref 36.0–46.0)
Hemoglobin: 11.6 g/dL — ABNORMAL LOW (ref 12.0–15.0)
MCHC: 34 g/dL (ref 30.0–36.0)
MCHC: 34.4 g/dL (ref 30.0–36.0)
MCV: 84.4 fL (ref 78.0–100.0)
MCV: 85.2 fL (ref 78.0–100.0)
Platelets: 331 10*3/uL (ref 150–400)
Platelets: 391 10*3/uL (ref 150–400)
RBC: 3.99 MIL/uL (ref 3.87–5.11)
RDW: 13.6 % (ref 11.5–15.5)
RDW: 14 % (ref 11.5–15.5)
RDW: 14.1 % (ref 11.5–15.5)
WBC: 11.3 10*3/uL — ABNORMAL HIGH (ref 4.0–10.5)
WBC: 13.9 10*3/uL — ABNORMAL HIGH (ref 4.0–10.5)

## 2010-12-21 LAB — URINALYSIS, ROUTINE W REFLEX MICROSCOPIC
Hgb urine dipstick: NEGATIVE
Specific Gravity, Urine: 1.044 — ABNORMAL HIGH (ref 1.005–1.030)
pH: 5.5 (ref 5.0–8.0)

## 2010-12-21 LAB — BASIC METABOLIC PANEL
BUN: 16 mg/dL (ref 6–23)
BUN: 20 mg/dL (ref 6–23)
CO2: 23 mEq/L (ref 19–32)
Chloride: 100 mEq/L (ref 96–112)
Chloride: 101 mEq/L (ref 96–112)
Creatinine, Ser: 0.84 mg/dL (ref 0.4–1.2)
GFR calc non Af Amer: >60 mL/min (ref >60)
Glucose, Bld: 138 mg/dL — ABNORMAL HIGH (ref 70–99)
Glucose, Bld: 248 mg/dL — ABNORMAL HIGH (ref 70–99)
Glucose, Bld: 336 mg/dL — ABNORMAL HIGH (ref 70–99)
Potassium: 3.4 mEq/L — ABNORMAL LOW (ref 3.5–5.1)
Potassium: 4.1 mEq/L (ref 3.5–5.1)
Sodium: 139 mEq/L (ref 135–145)

## 2010-12-21 LAB — URINE MICROSCOPIC-ADD ON

## 2010-12-21 LAB — PROTIME-INR: Prothrombin Time: 11.9 seconds (ref 11.6–15.2)

## 2010-12-21 LAB — GLUCOSE, RANDOM: Glucose, Bld: 536 mg/dL (ref 70–99)

## 2010-12-22 LAB — BASIC METABOLIC PANEL
BUN: 18 mg/dL (ref 6–23)
Calcium: 9.3 mg/dL (ref 8.4–10.5)
Creatinine, Ser: 0.84 mg/dL (ref 0.4–1.2)
GFR calc non Af Amer: >60 mL/min (ref >60)

## 2010-12-22 LAB — CBC
Platelets: 307 10*3/uL (ref 150–400)
RDW: 13.6 % (ref 11.5–15.5)
WBC: 9.1 10*3/uL (ref 4.0–10.5)

## 2010-12-22 LAB — PROTIME-INR
INR: 1 (ref 0.00–1.49)
Prothrombin Time: 12.8 seconds (ref 11.6–15.2)

## 2010-12-22 LAB — DIFFERENTIAL
Basophils Absolute: 0 10*3/uL (ref 0.0–0.1)
Lymphocytes Relative: 33 % (ref 12–46)
Lymphs Abs: 3 10*3/uL (ref 0.7–4.0)
Neutrophils Relative %: 61 % (ref 43–77)

## 2010-12-23 LAB — CBC
HCT: 37.7 % (ref 36.0–46.0)
HCT: 39 % (ref 36.0–46.0)
HCT: 43.7 % (ref 36.0–46.0)
Hemoglobin: 13.3 g/dL (ref 12.0–15.0)
MCHC: 34.2 g/dL (ref 30.0–36.0)
MCHC: 34.4 g/dL (ref 30.0–36.0)
MCV: 85 fL (ref 78.0–100.0)
MCV: 85.1 fL (ref 78.0–100.0)
MCV: 86 fL (ref 78.0–100.0)
Platelets: 273 10*3/uL (ref 150–400)
Platelets: 285 10*3/uL (ref 150–400)
RBC: 5.14 MIL/uL — ABNORMAL HIGH (ref 3.87–5.11)
RDW: 13 % (ref 11.5–15.5)
RDW: 13.1 % (ref 11.5–15.5)
WBC: 10.1 10*3/uL (ref 4.0–10.5)
WBC: 10.8 10*3/uL — ABNORMAL HIGH (ref 4.0–10.5)

## 2010-12-23 LAB — DIFFERENTIAL
Basophils Absolute: 0.1 10*3/uL (ref 0.0–0.1)
Basophils Relative: 1 % (ref 0–1)
Eosinophils Absolute: 0.1 10*3/uL (ref 0.0–0.7)
Eosinophils Relative: 1 % (ref 0–5)
Lymphs Abs: 3.1 10*3/uL (ref 0.7–4.0)
Neutrophils Relative %: 67 % (ref 43–77)

## 2010-12-23 LAB — BASIC METABOLIC PANEL
BUN: 14 mg/dL (ref 6–23)
Calcium: 8.5 mg/dL (ref 8.4–10.5)
Creatinine, Ser: 0.83 mg/dL (ref 0.4–1.2)
GFR calc Af Amer: >60 mL/min (ref >60)
GFR calc Af Amer: >60 mL/min (ref >60)
GFR calc non Af Amer: >60 mL/min (ref >60)
GFR calc non Af Amer: >60 mL/min (ref >60)
Glucose, Bld: 256 mg/dL — ABNORMAL HIGH (ref 70–99)
Potassium: 3.6 mEq/L (ref 3.5–5.1)
Sodium: 136 mEq/L (ref 135–145)

## 2010-12-23 LAB — GLUCOSE, CAPILLARY
Glucose-Capillary: 125 mg/dL — ABNORMAL HIGH (ref 70–99)
Glucose-Capillary: 244 mg/dL — ABNORMAL HIGH (ref 70–99)
Glucose-Capillary: 269 mg/dL — ABNORMAL HIGH (ref 70–99)
Glucose-Capillary: 372 mg/dL — ABNORMAL HIGH (ref 70–99)
Glucose-Capillary: 380 mg/dL — ABNORMAL HIGH (ref 70–99)
Glucose-Capillary: 390 mg/dL — ABNORMAL HIGH (ref 70–99)
Glucose-Capillary: 409 mg/dL — ABNORMAL HIGH (ref 70–99)
Glucose-Capillary: 468 mg/dL — ABNORMAL HIGH (ref 70–99)

## 2010-12-23 LAB — POCT I-STAT, CHEM 8
Glucose, Bld: 528 mg/dL (ref 70–99)
HCT: 46 % (ref 36.0–46.0)
Hemoglobin: 15.6 g/dL — ABNORMAL HIGH (ref 12.0–15.0)
Potassium: 4.5 mEq/L (ref 3.5–5.1)
Sodium: 135 mEq/L (ref 135–145)
TCO2: 24 mmol/L (ref 0–100)

## 2010-12-23 LAB — CARDIAC PANEL(CRET KIN+CKTOT+MB+TROPI)
CK, MB: 1.2 ng/mL (ref 0.3–4.0)
Troponin I: <0.01 ng/mL (ref 0.00–0.06)

## 2010-12-23 LAB — COMPREHENSIVE METABOLIC PANEL
ALT: 8 U/L (ref 0–35)
AST: 10 U/L (ref 0–37)
Alkaline Phosphatase: 114 U/L (ref 39–117)
CO2: 22 mEq/L (ref 19–32)
Calcium: 9.8 mg/dL (ref 8.4–10.5)
Chloride: 99 mEq/L (ref 96–112)
GFR calc Af Amer: >60 mL/min (ref >60)
GFR calc non Af Amer: >60 mL/min (ref >60)
Glucose, Bld: 493 mg/dL — ABNORMAL HIGH (ref 70–99)
Potassium: 4.2 mEq/L (ref 3.5–5.1)
Sodium: 135 mEq/L (ref 135–145)
Total Bilirubin: 0.5 mg/dL (ref 0.3–1.2)

## 2010-12-23 LAB — TSH: TSH: 0.602 u[IU]/mL (ref 0.350–4.500)

## 2010-12-23 LAB — PROTIME-INR
INR: 0.9 (ref 0.00–1.49)
Prothrombin Time: 12 seconds (ref 11.6–15.2)

## 2010-12-23 LAB — CK TOTAL AND CKMB (NOT AT ARMC)
CK, MB: 1.3 ng/mL (ref 0.3–4.0)
Relative Index: INVALID (ref 0.0–2.5)
Total CK: 47 U/L (ref 7–177)

## 2010-12-23 LAB — TROPONIN I: Troponin I: 0.04 ng/mL (ref 0.00–0.06)

## 2010-12-23 LAB — POCT CARDIAC MARKERS: Myoglobin, poc: 40.4 ng/mL (ref 12–200)

## 2010-12-28 ENCOUNTER — Ambulatory Visit: Payer: Self-pay | Admitting: Family Medicine

## 2011-01-12 ENCOUNTER — Ambulatory Visit (INDEPENDENT_AMBULATORY_CARE_PROVIDER_SITE_OTHER): Payer: Medicaid Other | Admitting: Family Medicine

## 2011-01-12 ENCOUNTER — Encounter: Payer: Self-pay | Admitting: Family Medicine

## 2011-01-12 VITALS — BP 126/86 | HR 121 | Temp 98.5°F | Ht 65.0 in | Wt 158.0 lb

## 2011-01-12 DIAGNOSIS — F329 Major depressive disorder, single episode, unspecified: Secondary | ICD-10-CM

## 2011-01-12 DIAGNOSIS — I10 Essential (primary) hypertension: Secondary | ICD-10-CM

## 2011-01-12 DIAGNOSIS — E1159 Type 2 diabetes mellitus with other circulatory complications: Secondary | ICD-10-CM

## 2011-01-12 DIAGNOSIS — I2510 Atherosclerotic heart disease of native coronary artery without angina pectoris: Secondary | ICD-10-CM

## 2011-01-12 DIAGNOSIS — F32A Depression, unspecified: Secondary | ICD-10-CM | POA: Insufficient documentation

## 2011-01-12 DIAGNOSIS — N390 Urinary tract infection, site not specified: Secondary | ICD-10-CM

## 2011-01-12 DIAGNOSIS — R300 Dysuria: Secondary | ICD-10-CM

## 2011-01-12 LAB — BASIC METABOLIC PANEL
BUN: 28 mg/dL — ABNORMAL HIGH (ref 6–23)
CO2: 22 mEq/L (ref 19–32)
Chloride: 99 mEq/L (ref 96–112)
Glucose, Bld: 377 mg/dL — ABNORMAL HIGH (ref 70–99)
Potassium: 5 mEq/L (ref 3.5–5.3)
Sodium: 137 mEq/L (ref 135–145)

## 2011-01-12 LAB — POCT URINALYSIS DIPSTICK
Bilirubin, UA: NEGATIVE
Glucose, UA: 500
Leukocytes, UA: NEGATIVE
Nitrite, UA: NEGATIVE

## 2011-01-12 LAB — POCT UA - MICROSCOPIC ONLY

## 2011-01-12 MED ORDER — LORAZEPAM 1 MG PO TABS: 1.0000 mg | ORAL_TABLET | Freq: Two times a day (BID) | ORAL | Status: DC

## 2011-01-12 MED ORDER — FLUCONAZOLE 100 MG PO TABS: 100.0000 mg | ORAL_TABLET | Freq: Every day | ORAL | Status: AC

## 2011-01-12 MED ORDER — VENLAFAXINE HCL ER 37.5 MG PO CP24: 75.0000 mg | ORAL_CAPSULE | Freq: Every day | ORAL | Status: DC

## 2011-01-12 MED ORDER — ZOLPIDEM TARTRATE 10 MG PO TABS: 10.0000 mg | ORAL_TABLET | Freq: Every evening | ORAL | Status: DC | PRN

## 2011-01-12 MED ORDER — LACTULOSE 10 GM/15ML PO SOLN: 20.0000 g | Freq: Two times a day (BID) | ORAL | Status: AC

## 2011-01-12 NOTE — Assessment & Plan Note (Signed)
Pt has a long hx of non compliance, pt is still not taking medications regularly, pt A1C is 13.3 which is same as before.  Pt given new prescription for meter, strips and lancets at this time.  Pt states she will start taking the medication.  Pt does have 100 protein in her urine as well. Cholesterol is elevated as well pt liekly not taking her medication either. Will get BMET today to see if kidneys are functioning and not going into renal failure.

## 2011-01-12 NOTE — Assessment & Plan Note (Signed)
Pt on wet mount show significant yeast burden, will treat with diflucan at this time, likely secondary to elevated blood sugars and will need to control of blood sugar first.

## 2011-01-12 NOTE — Assessment & Plan Note (Signed)
At goal , states not taking medications, do not know if I believe it.  Told her that some of the medicines are to hlep her not have a heart attack again and that they really need to be taken.  She states she has been taking a few of them from time to time.

## 2011-01-12 NOTE — Assessment & Plan Note (Signed)
Told pt she really needs to see Dr. Allyson Sabal she states she is calling today.

## 2011-01-12 NOTE — Patient Instructions (Signed)
I am sending in a lot of new medications for you 1.  Diflucan.  Take 1 pill now and repeat in 3 days 2.  I am giving you a prescription for ativan.  Take 1 pill twice daily only as needed for anxiety 3. I am starting Effexor.  Take 1 pill daily for the next 5 days then 2 pills weekly thereafter 4. For you constipation I will change your mira lax to lactulose.  Take 1 capful twice daily for constipation.  5.  I will send you in a new prescription for a meter and with lancets and strips 6.  Your blood pressure is great. 7.  If your back gets a lot more painful, or if you get a very high fever please come back or go to the ED 8.  Please start taking your insulin, this is the best thing you can do for yourself.  9.  Please, please make an appointment with Dr. Allyson Sabal 10.  I want to see you again in 2 weeks (ok to double book but we can only talk about your depression at that visit.

## 2011-01-12 NOTE — Assessment & Plan Note (Signed)
Pt told that will treat with two aspects.  Does have an anxiety component told will give ativan to have on hand but only for one month, denies  Suicidal and Homicidal ideation.  Pt also in the past had been on effexor with good results, will restart medication as well. Will follow up in 2 weeks.  Told about Dr. Pascal Lux and she will consider it.

## 2011-01-12 NOTE — Progress Notes (Signed)
  Subjective:    Patient ID: [name], female    DOB: 08-Oct-1960, 50 y.o.   MRN: [id number]  HPI 1. Hypertension Blood pressure at home:does not check Blood pressure today: 126/86 Taking Meds: no Side effects:no ROS: Denies headache visual changes, vomiting, chest pain or abdominal pain or shortness of breath.  2. Diabetes:  High at home: not checking Low at home:not checking Taking medications: no Side effects:none except polyuria and and polydipsia ROS: denies fever, chills, dizziness, loss of conscieness, numbness or tingling in extremities or chest pain.  3.  CAD-  Pt still has not seen by Dr. Allyson Sabal denies chest pain except when eating. Pt is taking her plavix.   4.  Pt has form from [facility] for me to sign.   5.  Pt states she is having pain with urination, pt denies any blood, is having a little more urgency, maybe a little back pain but no fever or chills at this time.  No vaginal discharge  6. Pt is finding it hard for her to do any activity she used to like, not sleeping well, is losing weight and is fighting with her husband more.  Pt does have hx of being depressed in the past.  Pt denies suicide or homicidal ideation.   Pt would be interested in treatment but not therapy at this time.    Review of Systems See above.     Objective:   Physical Exam Generally: NAD mildly dishelved, tearful CV: RRR 1/6 SEM radiating to carotids.  Pul: CTAB Abd:  BS +, mild stool burden palpated in RLQ, no masses, TTP subrpubic, no CVA tenderness. Afebrile Ext:  Trace edema at ankles.        Assessment & Plan:

## 2011-01-14 ENCOUNTER — Other Ambulatory Visit: Payer: Self-pay | Admitting: Sports Medicine

## 2011-01-14 NOTE — Telephone Encounter (Signed)
Refill request

## 2011-01-25 NOTE — Discharge Summary (Signed)
[name], [name]               ACCOUNT NO.:  [id number]   MEDICAL RECORD NO.:  [id number]          PATIENT TYPE:  INP   LOCATION:  2032                         FACILITY:  [facility]   PHYSICIAN:  Salvatore Decent. Cornelius Moras, M.D. 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   IN HOSPITAL DIAGNOSES:  1. Postoperative pneumonia.  2. Urinary tract infection, Klebsiella postoperatively.  3. Acute blood loss anemia postoperatively.  4. Volume overload postoperatively.   SECONDARY DIAGNOSES:  1. History of peripheral neuropathy.  2. History of cerebrovascular accident.  3. Diabetes mellitus.  4. Status post cholecystectomy.  5. Status post cesarean section.  6. Status post dental extraction upper.  7. Hypertension.  8. Hyperlipidemia.  9. Gastroesophageal reflux disease.  10.Peripheral vascular disease status post right iliac stent.  11.History of carotid disease with 70-99% on the left.  12.Mild renal artery stenosis.  13.Atherosclerotic cardiovascular disease with history of permanent      stent placement of circumflex in October 2008.  14.Residual RCA disease which is dominant.  15.Chronic back pain.   IN HOSPITAL OPERATIONS AND PROCEDURES:  Off-pump coronary artery bypass  grafting x1 using a saphenous vein graft to posterior descending  coronary artery with endoscopic saphenous vein harvest from the right  side.   HISTORY OF PRESENT ILLNESS/HOSPITAL COURSE:  The patient is a 50-year-  old obese female with history of coronary artery disease, hypertension,  hyperlipidemia, type 2 diabetes mellitus, cerebrovascular disease,  peripheral vascular disease, and longstanding tobacco abuse.  The  patient presented with several month history of symptoms consistent with  angina pectoris.  Cardiac catheterization  on October 2008 was notable  for two vessel coronary artery disease.  She underwent percutaneous  coronary intervention and stenting in left circumflex coronary artery.  The patient continued to have medically refractory symptoms of angina.  Follow-up catheterization revealed excellent result in the stenting in  left circumflex territory.  There is not any significant disease in the  left anterior descending coronary artery.  There remains diffuse disease  in the right coronary artery and coronary anatomy in the right coronary  artery is felt to be extremely unfavorable for possible percutaneous  coronary intervention.  The patient was managed medically initially, but  continued to have refractory symptoms of angina despite maximal medical  therapy ultimately prompting surgical referral.  The patient was seen  and evaluated by Dr. Cornelius Moras.  Dr. Cornelius Moras discussed with the patient  undergoing coronary artery bypass grafting.  He discussed risks and  benefits with the patient.  He discussed potentially doing this  procedure off-pump, and this was discussed with the patient.  The  patient nodded her understanding, and agreed to proceed.  Surgery was  scheduled for October 05, 2007.  For details of the patient's Past  Medical History and Physical Exam, please see dictated H&P.   The patient was taken to the operating room October 05, 2007 where she  underwent  off-pump coronary artery bypass grafting x1 using saphenous  vein graft to posterior descending coronary artery with endoscopic  saphenous vein harvest from the right side.  The patient tolerated this  procedure, and was transferred to the intensive care unit in stable  condition.  Postoperatively the patient was noted to be hemodynamically  stable.  She was extubated the evening of surgery.  Post extubation  noted to be alert and oriented x4.  Post extubation the patient was  placed on nasal cannula.  Sats were maintained greater than 90%.   Postop  day #1 chest x-ray obtained was clear.  Chest tubes were to able to be  DC'd in normal fashion.  Unfortunately postop day #2 the patient  developed a fever of 102.9 with hypoxemia.  Chest x-ray showed new  patchy air space disease which was concerning for pneumonia.  The  patient was started on IV vancomycin and was already on ciprofloxacin  p.o. for Klebsiella urinary tract infection.  A spinal  CT was also done  for further evaluation to rule out PE.  This was negative.  The patient  was continued on IV vancomycin and p.o. ciprofloxacin.  Her hypoxemia  improved.  Follow-up chest x-ray showed improving patchy air space  disease.  The patient's temperature was monitored and improved by postop  day #3.  The patient's white blood cell count had remained within normal  limits during this period.  The patient was eventually able to be weaned  off oxygen sating greater than 92% prior to discharge home.  She will  continue using her incentive spirometer.  Note the patient uses CPAP at  night, and used this during the hospital as she needed.  Postoperatively  the patient was noted to be in normal sinus rhythm.  All drips were able  to be weaned and DC'd.  Swan-Ganz catheter DC'd in normal fashion.  During her postoperative course blood pressure was low normal.  Diuretics were held prior to blood pressure stabilizing.  Her a.m. blood  pressure monitored over the next several days.  Blood pressure was  improving, and she was started on low dose beta blocker.  Prior to  discharge home the patient's blood pressure slightly elevated, and beta  blocker was increased and she was started on low dose ACE inhibitor.  Blood pressure and heart rate were stable prior to discharge home.  She  was in a normal sinus rhythm during her entire postoperative course.  Preoperatively the patient had urinalysis that was noted to be abnormal.  During her intraoperative period she was given IV ciprofloxacin,  and  urine was obtained from her Foley catheter for culture.  Ciprofloxacin  was continued.  The patient's urine cultures came back showing positive  for Klebsiella.  She was continued on the ciprofloxacin p.o. for the  Klebsiella urinary tract infection as well as assistance in treatment  for the postop pneumonia.  The patient was voiding without difficulty  prior to discharge home.  Postoperatively the patient did have slight  acute blood loss anemia.  Hemoglobin/hematocrit dropped to 8 and 23.5  postop day #3.  She was asymptomatic at this point, and this was  followed.  Currently a repeat CBC pending.  The patient did have slight  postop volume overload initially.  She was not started on diuretics  secondary to hypotension.  Once hypotension improved the patient's  volume overload was also improving.  She was only 1.6 kg above her  preoperative weight prior to discharge  home.  She received a few doses  of diuretics prior to discharge.  The patient is out of bed ambulating  well with cardiac rehab.  She was progressing well, ambulating with  minimal assistance.  The patient was tolerating diet, no nausea,  vomiting noted.  Postoperatively the patient noted to have significant  incisional pain.  States she was taking Percocet a large dose prior to  surgery for peripheral neuropathy.  States she has a high tolerance for  pain medication.  The patient was started on Dilaudid IV during her  hospital course with plan to discharge home on prior Percocet dose.  Pain was better controlled prior to discharge.   Postop day #5 vital signs showed the patient was afebrile, O2 sats were  greater than 90% on room air.  Blood sugars were followed during her  postoperative course and remained stable.  She was continued on sliding  scale insulin.  The patient was in normal sinus rhythm.  Pulmonary  status was stable.  All incisions are clean, dry, and intact and healing  well.  The patient is  tentatively ready for discharge home in the a.m.  postop day #6, October 11, 2007.   FOLLOW-UP APPOINTMENTS:  Follow-up appointment has been arranged with  Dr. Cornelius Moras for October 29, 2007 at 1:15 p.m..  The patient will need to  obtain PMI chest x-ray 30 minutes prior to this appointment.  The  patient will need to follow up with Dr. Allyson Sabal in two weeks.  She will  need to contact Dr. Hazle Coca office to make these arrangements.   ACTIVITY:  Patient instructed no driving until released to do so, no  heavy lifting over 10 pounds.  She is told to ambulate 3-4 times per  day, progress as tolerated, and to continue her breathing exercises.   INCISIONAL CARE:  The patient is told to shower washing her incisions  using soap and water.  She is to contact the office if she develops any  drainage or opening from any of her incision sites.   DIET:  The patient is educated on diet to be low fat, low salt as well  as carbohydrate modified, medium calorie diet.   DISCHARGE MEDICATIONS:  1. Aspirin 81 mg daily.  2. Lopressor 50 mg b.i.d.  3. Lisinopril 10 mg daily.  4. Crestor 5 mg daily.  5. Aciphex 40 mg daily.  6. Plavix 75 mg daily.  7. Neurontin 600 mg at night.  8. NovoLog 70/30 sliding scale as used at home.  9. Avelox 400 mg daily x7 days.  10.Percocet 10/325 1-2 tablets q.4-6 h. p.r.n.      Theda Belfast, PA      Salvatore Decent. Cornelius Moras, M.D.  Electronically Signed    KMD/MEDQ  D:  10/10/2007  T:  10/10/2007  Job:  [id number]   cc:   Nanetta Batty, M.D.

## 2011-01-25 NOTE — Consult Note (Signed)
NEW PATIENT CONSULTATION   [name], [name]  DOB:  23-Aug-1961                                        September 17, 2007  CHART #:  [id number]   REASON FOR CONSULTATION:  Single vessel coronary artery disease.   HISTORY OF PRESENT ILLNESS:  [name] is a 50 year old female patient  of Dr. Nanetta Batty and Dr. Cam Hai, with known history of  coronary artery disease, hypertension, hyperlipidemia, type 2 diabetes  mellitus, cerebrovascular disease, peripheral vascular disease, and long  standing tobacco abuse. The patient states that she first began having  symptoms of substernal chest pain associated with shortness of breath,  consistent with angina pectoris, 5 or 6 months ago. She has been  followed by Dr. Nanetta Batty and a stress test in April of 2008 was  felt to be low risk for coronary ischemia. However, she developed  worsening symptoms of substernal chest pain, that occasionally radiates  to her left arm and is associated with shortness of breath. These  symptoms have been associated with physical activity, although over the  last few months, they have also occurred at rest. They are typically  relieved, at least partially, by the administration of sublingual  nitroglycerin. She underwent cardiac catheterization in October 2008.  She was found to have 2 vessel coronary artery disease. She subsequently  underwent percutaneous coronary intervention and stenting of the mid  left circumflex coronary artery by Dr. Allyson Sabal, using a drug eluting  stent. The patient continued to have symptoms of chest pain consistent  with angina. She was re-admitted to the hospital in early November. She  ruled out for acute myocardial infarction. Repeat cardiac  catheterization performed by Dr. Tresa Endo on July 24, 2007 reveals that  the stent in the mid left circumflex coronary artery remains widely  patent. There has not been any other progression of coronary artery  disease, but there remains severe disease in the right coronary artery.  The right coronary artery anatomy is felt to be unfavorable for  percutaneous coronary intervention. Initially, an attempt at continued  medical therapy was recommended. Despite this, [name] has continued  to smoke cigarettes. However, she also continues to have daily episodes  of chest pain, consistent with angina pectoris. She returned to see Dr.  Allyson Sabal in mid December and she has now been referred for possible  surgical revascularization due to refractory symptoms of angina,  unresponsive to maximal medical therapy.   REVIEW OF SYSTEMS:  GENERAL:  The patient reports that she fatigues  easily. Her appetite is up and down. She has actually lost a fair amount  of weight over the last few years, currently weighing approximately 180  pounds. She originally weighed as much as 260 pounds in the past. She is  5 foot 4 inches tall. CARDIAC:  Notable for daily episode of substernal  chest pain that frequently radiate to the left arm and are typically  associated with shortness of breath. This is described as a tightness  across the chest. It is also sometimes associated with tachy  palpitations. The symptoms are usually relieved or partly relieved with  administration of sublingual nitroglycerin. The symptoms are brought on  with minimal physical activity. The patient has exertional shortness of  breath. She also has some mild shortness of breath lying flat. She  denies any  syncopal episodes. RESPIRATORY:  Notable for the absence of  productive cough, hemoptysis, and wheezing. The patient continues to  smoke, at least 1/2 pack to 1 pack of cigarettes per day.  GASTROINTESTINAL:  Negative. The patient has occasional trouble  swallowing pills but no trouble with food. She has occasional symptoms  of reflux. She denies hematochezia, hematemesis, or melena.  MUSCULOSKELETAL:  Notable for chronic pain in both feet that is  worse at  night when she lies in bed. She denies any calf pain with ambulation.  GENITOURINARY:  Negative. The patient denies urinary urgency or  frequency. NEUROLOGIC:  Notable for headaches associated with sublingual  nitroglycerin. The patient also has decreased vision in the right eye,  in which she is almost blind. This dates back to a stroke that she  sustained in December 2007. She otherwise, denies any transient numbness  or weakness involving either upper or lower extremity and she denies any  transient monocular visual disturbances in the left eye. HEENT:  Negative. ENDOCRINE:  Notable for type 2 diabetes mellitus, dating back  approximately 8 years. The patient states that she checks her blood  sugars 3 times every day. This has been gradually under better control,  although she thinks that her most recent hemoglobin A1C was  approximately 9.   PAST MEDICAL HISTORY:  1. Coronary artery disease, status post PCI and stenting of the left      circumflex coronary artery in October 2008.  2. Hypertension.  3. Hyperlipidemia.  4. Type 2 diabetes mellitus.  5. Cerebrovascular disease, status post stroke 2007.  6. Peripheral vascular disease, status post PTA and stenting of the      right iliac artery.  7. Long standing tobacco abuse.   FAMILY HISTORY:  The patient's mother had a heart attack at a young age  and underwent triple bypass surgery in 1997.   SOCIAL HISTORY:  The patient is married and previously worked as a  Education administrator, although she has been out of work for some time now. She smokes  between 1/2 and 1 pack of cigarettes per day and has done so for many  years. She denies significant alcohol consumption. She has 2 children.   CURRENT MEDICATIONS:  1. Crestor 5 mg daily.  2. Aciphex 20 mg daily.  3. Lisinopril 40 mg daily.  4. Imdur 120 mg daily.  5. Plavix 75 mg daily.  6. Lopressor 75 mg twice daily.  7. Ranexa 500 mg daily.  8. Requip 2 mg at bedtime.  9.  Neurontin 300 mg 2 tablets at bedtime.  10.Sublingual nitroglycerin as needed for pain.   ALLERGIES:  AUGMENTIN.   PHYSICAL EXAMINATION:  GENERAL:  A well appearing, moderately obese  female who appears stated age, in no acute distress.  VITAL SIGNS:  Blood pressure 182/108, pulse 90, oxygen saturation 98% on  room air.  HEENT:  Examination is grossly unrevealing.  NECK:  Supple. There is no cervical or supraclavicular adenopathy. There  is a soft left carotid bruit. There is no jugular venous distention.  CHEST:  Auscultation demonstrates clear breath sounds, which are  symmetrical bilaterally. No wheezes or rhonchi are demonstrated.  CARDIOVASCULAR:  Regular rate and rhythm. No murmur, rub, or gallop  appreciated.  ABDOMEN:  Soft, nontender, moderately obese. There are no palpable  masses. Bowel sounds present.  EXTREMITIES:  Warm and adequately perfused. There is no lower extremity  edema. Distal pulses are palpable but thready in the dorsalis pedis  position. There  are no open skin lesions or ulcerations.  RECTAL/GENITOURINARY:  Examinations and both deferred.  NEUROLOGIC:  Examination is grossly non-focal, although the patient does  report markedly decreased visual acuity out of her right eye in all  fields.   DIAGNOSTIC STUDIES:  Cardiac catheterization performed by Dr. Nanetta Batty on June 27, 2007 is reviewed. Cardiac catheterization performed  on August 04, 2007 by Dr. Nicki Guadalajara is also reviewed. These  demonstrate coronary artery disease involving all 3 vascular territories  but functionally only single vessel coronary artery disease at the most  recent examination. Specifically, there is 80% to 90% stenosis of the  left anterior descending coronary artery at the apex of the heart.  Proximal to this there are no significant flow lesions. The left  anterior descending coronary artery does wrap the apex of the heart and  provides perfusion to the very distal  inferior wall at the apex.  However, this lesion is literally right on the apex of the heart and is  therefore, too far out for any possibility of any surgical  revascularization in this territory. In October, there was a tubular 70%  to 80% stenosis of the mid left circumflex coronary artery. This ws  intervened on by Dr. Nanetta Batty and a drug eluting stent was placed.  At re-look catheter in November, the stent remained widely patent. There  is perhaps, 30% to 40% proximal stenosis of the left circumflex but  otherwise, no flow limiting lesions. There is right dominant coronary  circulation although the right coronary artery is relatively small and  the posterior descending coronary artery is a single dominant terminal  sub-branch of this vessel. The right coronary artery is somewhat  tortuous and has numerous lesions including 70% to 80% mid stenosis,  another 80% stenosis in the mid portion of this vessel, and an 80% to  90% stenosis of the proximal posterior descending coronary artery. Left  ventricular function appears normal.   IMPRESSION:  Functionally single vessel coronary artery disease with  refractory symptoms of angina pectoris, despite maximal medical therapy.  To some degree, the severity of [name]'s symptoms of angina seem a  bit disproportionate to her coronary anatomy, although she does have  high grade disease involving the right coronary artery, which presumably  is the source of her continued problems with chest pain. The posterior  descending coronary artery is a somewhat small caliber vessel, but  nonetheless, should be graftable at the time of open heart surgery. The  left circumflex stent is widely patent and as such, there is no  functionally significant disease in this territory at present.  Similarly, the left anterior descending coronary artery is without flow  limiting lesion, with exception of the lesion at the apex of the heart,  which is well  beyond anything that might be appropriate to attempt to  treat surgically or percutaneously. [name] appears to have failed an  attempt at maximal medical therapy to control her symptoms.  Unfortunately, she does continue to smoke cigarettes.   PLAN:  I have discussed matters at length with [name] and her husband  here in the office today. Alternatives include continued medical therapy  versus proceeding with surgery. Given her co-morbid medical problems,  the risks of surgery should not be discounted but nonetheless, she  should likely do acceptably well. What is more important  presumably is  her long term prognosis, which will not be improved if she cannot do a  better  job at controlling her underlying chronic medical illnesses. In  particular, she must find a way to quit smoking. She must also find a  way to get her diabetes under considerably better control. Without these  interventions on her long term medical care, her prognosis would be  guarded at best. She does have cerebrovascular disease with significant  left internal carotid artery stenosis, that remains sub-clinical at this  point. The severity of carotid artery stenosis noted on duplex scan  performed at [facility] in October 2008,  probably does not warrant surgical treatment at this time, but it  certainly would increase the risk of stroke at the time of surgery.   PLAN:  I have instructed [name] to quit smoking immediately. I would  be willing to see her back in a week or two, if she can quit smoking and  if that is the case, we can go ahead and make  arrangements for her to be admitted to the cardiology service for  intravenous anticoagulation while her Plavix is discontinued. All of her  questions have been addressed.   Salvatore Decent. Cornelius Moras, M.D.  Electronically Signed   CHO/MEDQ  D:  09/17/2007  [name]:  09/17/2007  Job:  [id number]   cc:   Nanetta Batty, M.D.  Lupita Raider, M.D.

## 2011-01-25 NOTE — H&P (Signed)
[name], [name]               ACCOUNT NO.:  [id number]   MEDICAL RECORD NO.:  [id number]          PATIENT TYPE:  INP   LOCATION:  4714                         FACILITY:  [facility]   PHYSICIAN:  Leighton Inch McDiarmid, M.D.DATE OF BIRTH:  28-Jan-1961   DATE OF ADMISSION:  04/14/2008  DATE OF DISCHARGE:  04/15/2008                              HISTORY & PHYSICAL   PRIMARY CARE Samie Barclift:  Lupita Raider, M.D.   CHIEF COMPLAINT:  Chest pain and malaise.   HISTORY OF PRESENT ILLNESS:  The patient is a 50 year old female with  history of diabetes mellitus, hypertension, hyperlipidemia, coronary  artery disease, stroke, and other vascular history with a chief  complaint of feeling terrible and chest pain.  The patient first had flu  like symptoms last Thursday.  The patient endorse positive nausea and  vomiting, subjective fever, myalgias, cough, and runny nose.  After an  episode of emesis, the patient began to notice some chest pain.  Chest  pain was substernal in location with radiation to left shoulder and arm.  The pain was described as the tightness and like some one is sitting on  my chest.  Pain is aggravated with vomiting and relieved by nothing.  The pain is different in character and severity from prior episodes.  The pain is constant and has been present for 4 days.  The patient also  endorsed mild shortness of breath with exertion.   ALLERGIES:  AUGMENTIN causes anaphylaxis.   MEDICATIONS:  1. Lisinopril 20 mg p.o. daily.  2. Lopressor 50 mg p.o. twice daily.  3. Plavix 75 mg p.o. daily.  4. Lovaza 1 tablet p.o. twice daily.  5. Crestor 20 mg p.o. daily.  6. Aciphex 20 mg p.o. daily.  7. Zetia 10 mg p.o. daily.  8. Humulin 70/30 subcu 75 units in the morning and sliding scale      insulin at night.   PAST MEDICAL HISTORY:  1. Hypertension.  2. Cerebrovascular accident.  3. Hyperlipidemia.  4. Chronic UTI.  5. Pseudotumor cerebri.  6. Coronary artery disease.   PAST  SURGICAL HISTORY:  1. CABG in 2009.  2. PCI stent placement x2.   SOCIAL HISTORY:  The patient lives in [place] with husband and 8-year-  old daughter.  The patient has a 30-pack-year history of tobacco use.  The patient denies current tobacco use.  The patient denies any alcohol  or other drug use.   REVIEW OF SYSTEMS:  Positive for thrush, fever, chills, nausea,  vomiting, sore on her left ear, cough, diarrhea, abdominal pain, leg  pain, and weight loss.  Review of systems negative for paroxysmal  nocturnal dyspnea, palpitations, edema, sputum, hematemesis, melena,  dysuria, hematuria, or rash.   FAMILY HISTORY:  Maternal diabetes mellitus and hypertension.  Father:  Cerebrovascular accident, diabetes mellitus, hypertension.   PHYSICAL EXAMINATION:  VITAL SIGNS:  Temperature is 98.7, pulse 132,  respirations 20, and blood pressure 156/91.  GENERAL:  No acute distress, overweight, and appears older than stated  age.  HEENT:  Thrush on tongue.  No thrush evident in oropharynx and  erythematous lesions  on left ear.  NECK:  No JVD.  No LAD.  CARDIOVASCULAR:  Regular rate and rhythm.  No murmurs, rubs, or gallops.  Chest pain reproducible with palpation of chest wall.  RESPIRATORY:  Mild wheezes in left lung.  ABDOMEN:  Soft and tender to palpation throughout.  Positive bowel  sounds.  EXTREMITIES:  No edema.  Normal pulses.  BREAST:  Scant discharge with the expression of left nipple.  No  purulent discharge.   LABS/STUDIES:  1. VQ scan was obtained because the patient was complaining of      pleuritic chest pain to the ED physician, which was a low      probability for PE.  2. Troponin I point of care 0.03, CK 15, and CK-MB 0.7.  3. BMP:  Sodium 131, potassium 4.0, chloride 98, CO2 of 20, BUN 17,      creatinine 0.96, and glucose 499.  4. CBC:  White blood count 8.4, hemoglobin 13.3, hematocrit 39.6, and      platelet 388.  5. Cardiac markers.  Troponin I less than 0.05  and CK-MB 1.5.  6. Chest x-ray.  No active disease.  7. EKG.  New right bundle branch block compared to prior EKG in June.  8. Urinalysis.  Many bacteria and white blood cells.   ASSESSMENT AND PLAN:  The patient is a 50 year old female with malaise,  chest pain, hypertension, and diabetes.  1. Malaise.  The patient complaining of flu-like symptoms including      nausea, vomiting, diarrhea, myalgias, headache, subjective fever,      cough, and rhinorrhea.  It seems consistent with a viral      gastroenteritis or upper respiratory infection possibly concerning      for H1N1.  We will get a swab and place the patient on droplet      precaution even though this is an unlikely diagnosis, and the      patient has been afebrile with a normal white count and is      currently not coughing.  2. Chest pain.  When seen in emergency department, the patient was      complaining of more pleuritic chest pain.  So VQ-scan was obtained      to rule out PE and which showed low probability of PE.  The patient      then stopped complaining of any pleuritic chest pain.  Given the      patient's extensive vascular history and risk factors, we must      admit and rule out myocardial infarction.  One point of care and      one set of cardiac enzymes were within normal limits.  EKG showed      new right bundle blanch block.  We have consulted her cardiologist      to determine the degree of change, and we will follow with their      recommendations.  Ordered cardiac enzymes x3, TSH.  We will risk      stratify the patient with lipid panel and hemoglobin A1c in the      morning.  Other differential diagnosis to consider include anemia,      thyroid dysfunction, GERD, esophagitis, and pericarditis.  3. Diabetes mellitus.  Questionable med compliance since the patient      claimed to be taking different doses of Humulin than prescribed by      PCP.  We will start Humulin 70/30 at 75 units in the morning, 30  units in the evening, and we will cover with moderate sliding scale      insulin.  We will check hemoglobin A1c to determine compliance or      effectiveness of medication.  4. Thrush. It could be due to diabetes mellitus or recent use of      antibiotics for mastitis.  We will treat with nystatin swish and      swallow.  5. Sore on left ear.  The patient has no history of tick bites, and      there appears to be no abscess or nothing to drain.  We will      continue to monitor.  6. Headache.  The patient has had headache for 2 years and this is the      chronic problem.  The patient has been evaluated by Neurology and      has an outpatient appointment to see another neurologist in the      next couple of weeks.  We will try to symptomatically treat with      Tylenol and oxycodone as needed.  We do not anticipate resolution      of her symptoms in the hospital.  7. Mastitis.  The patient has no purulent discharge, only scant      discharge with expression.  She is scheduled for surgery next week.      The patient has been afebrile, and there is no gross masses or      lesions on the breast.  I do not feel like this is contributing to      her systemic symptoms.  8. Asymptomatic bacteruria.  The patient has a history of having      bacteria and white blood cells in her urine, and she denies any      burning frequency or urgency with urination.  At this point, we      will continue to monitor for signs or symptoms of a urinary tract      infection, and we will treat appropriately as needed.  9. Gastroesophageal reflux disease.  Continue home dose of Nexium.  10.Peripheral vascular disease. The patient has stent and will      continue with Plavix.  11.Tachycardia.  Seems consistent with dehydration.  Also, concerning      for fever or PE.  The patient's tachycardia improved with      hydration, so it seems the patient was probably low on fluids.  We      will continue to monitor with  regular vital signs.      [name], MD  Electronically Signed      Leighton Hymas McDiarmid, M.D.  Electronically Signed    WS/MEDQ  D:  04/15/2008  T:  04/16/2008  Job:  [id number]

## 2011-01-25 NOTE — H&P (Signed)
NAMESHAMONICA, [name]               ACCOUNT NO.:  [id number]   MEDICAL RECORD NO.:  [id number]          PATIENT TYPE:  EMS   LOCATION:  MAJO                         FACILITY:  [facility]   PHYSICIAN:  Madaline Savage, M.D.DATE OF BIRTH:  1961/08/03   DATE OF ADMISSION:  07/20/2007  DATE OF DISCHARGE:                              HISTORY & PHYSICAL   CHIEF COMPLAINT:  Chest pain.   [name] is a 50 year old female, followed by Dr. Allyson Sabal in [facility].  She has a history of coronary disease and has had previous  circumflex and right common iliac artery stenting.  She presented to the  office recently with complaints of chest pain.  We were concerned she  may have progression of her coronary disease, as she has multiple risk  factors.  She was admitted for catheterization and, on June 27, 2007,  had a PROMUS stent placed to a new lesion in her circumflex.  She also  had residual RCA disease of 80% that was scattered.  The RCA was a  tortuous vessel.  The left iliac also had an 80% stenosis.  By Doppler  studies, she also has bilateral carotid disease, as well.  She had seen  Dr. Allyson Sabal July 11, 2007, and was scheduled to have an elective iliac  intervention on July 31, 2007.  He was not sure he was going to be  able to intervene on her RCA, as it was a very tortuous vessel and  diffusely diseased.  Last night, the patient developed some nausea and  vomiting, followed by substernal chest pain.  She took nitroglycerin  with improvement after three tablets.  This morning she had recurrence  in her symptoms and took nitroglycerin again and called the office.  She  is now seen in the emergency room.  Her symptoms are similar to her pre-  PCI pain.  She says that she had actually done well until about three  days ago, when she started to feel poorly.   PAST MEDICAL HISTORY:  Remarkable for insulin-dependent diabetes.  She  has dyslipidemia.  She has vascular disease as  described above and has  had a previous right common iliac artery intervention.   PREVIOUS SURGERIES INCLUDE:  Laparoscopic cholecystectomy, tubal  ligation and hysterectomy.   HOME MEDICATIONS:  1. AcipHex 20 mg a day.  2. Amlodipine 10 mg a day.  3. Crestor 5 mg a day.  4. Lisinopril 40 mg a day.  5. Atenolol 100 mg b.i.d.  6. Imdur 30 mg a day.  7. Plavix 75 mg a day.  8. NovoLog 70/30 75 units twice a day.   ALLERGIES:  She is allergic to AUGMENTIN and IV CONTRAST causes nausea  and vomiting.   SOCIAL HISTORY:  She is married.  She lives with her husband.  She just  quit smoking about a month ago.   FAMILY HISTORY:  Remarkable in that her mother died of coronary disease  and diabetes complications.   REVIEW OF SYSTEMS:  Essentially unremarkable, except as noted above.  She has had some associated palpitations.  PHYSICAL EXAM:  Blood pressure 158/88, pulse 100, temperature 97.9,  respirations 12.  IN GENERAL:  She is a well-developed, overweight female, in no acute  distress.  HEENT:  Normocephalic.  Extraocular movements are intact.  Sclerae are  anicteric.  NECK:  Reveals bilateral carotid artery bruits, no JVD.  CHEST:  Reveals clear lung fields.  CARDIAC EXAM:  Reveals regular rate and rhythm without obvious murmur,  rub or gallop.  Normal S1, S2.  ABDOMEN:  Obese, nontender, no hepatosplenomegaly.  EXTREMITIES:  Without edema.  Distal pulses are 1 to 2+ bilaterally.  SKIN:  Warm and dry.  NEUROLOGIC EXAM:  Grossly intact.  She is awake, alert and oriented and  cooperative.  Moves all extremities without obvious deficit.   The EKG shows sinus rhythm without acute changes.   INITIAL LABS:  Pending.   IMPRESSION:  1. Unstable angina.  2. Known coronary disease with prior circumflex intervention and      recent new circumflex intervention, October 2008.  3. Peripheral vascular disease with prior right common iliac artery      stent placement that was patent  at recent catheterization with new      80% left common iliac artery stenosis.  4. Insulin dependent diabetes.  5. Long history of smoking, although the patient says she is not      smoking now.  6. Hypertension.  7. Dyslipidemia.   PLAN:  Patient will be admitted to telemetry.  We will start her on  Ranexa and IV nitrates and Lovenox.  She may need another  catheterization, possibly Monday.  We will try and settle her down with  medicines first.      Abelino Derrick, P.A.    ______________________________  Madaline Savage, M.D.    Lenard Lance  D:  07/20/2007  T:  07/20/2007  Job:  [id number]

## 2011-01-25 NOTE — Assessment & Plan Note (Signed)
Ms. Shayla is back regarding her headaches.  She has noticed the big  difference with the Topamax.  She is requesting more Percocet.  Her  urine drug screen was clear that we have got after last visit.  She did  not find the Tofranil particularly helpful.  She felt that made her have  a funny taste in her mouth.  I did not receive any reports or studies  regarding her recent head CT from June.  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   The patient rates her pain 8-9/10, describes as burning, constant,  tingling, and aching.  She states that pain interferes with general  activity, in relations with others, and enjoyment of life on a moderate  level.  Sleep is poor.  She can walk around 10 minutes while having  stopped.  She is still having some leg pain.  She is followed by  Vascular Surgery for this apparently.   REVIEW OF SYSTEMS:  Notable for tingling, night sweats, and high sugars.  Full review is in the written health history section in the chart, and  other pertinent positives are as above.   SOCIAL HISTORY:  This has not changed.   PHYSICAL EXAMINATION:  Blood pressure is 160/89, pulse 82, respiratory  rate 18, and she is sating 99% on room air.  The patient is pleasant,  alert, and oriented x3.  Affect is bright and appropriate.  Her gait is  stable.  The patient has decreased sensation in her legs up to mid calf.  Strength is 5/5 in all extremities.  Reflexes are 1+.  No focal changes  noted in the cranial nerve exam.  Cognitively, she is appropriate.  Pupils are equally round and reactive to light.   ASSESSMENT:  1. Chronic headaches with no certain etiology.  She has a followup      visit to the [facility] scheduled.  I support that plan.  2. Hypertension.   PLAN:  1. We will increase Topamax to 200 mg nightly.  The patient may also      increase it to 300 mg if tolerated, as needed.  2. Stop propranolol and begin trail of Trazodone  100 mg nightly with      Rozerem 8 mg nightly.  Ask her to back off the Trazodone if she      should feel drowsy with this combination.  3. Refilled Percocet 10/325, #120 today.  Discussed the fact that if      she should require further increase in her oxycodone that we will      need to look at a long-acting agent.  4. I will see her back in 1 month's time.      Ranelle Oyster, M.D.  Electronically Signed     ZTS/MedQ  D:  04/21/2008 14:14:00  T:  04/22/2008 05:18:06  Job #:  [id number]   cc:   Lupita Raider, M.D.

## 2011-01-25 NOTE — Assessment & Plan Note (Signed)
OFFICE VISIT   SEVA, CHANCY T  DOB:  1961-08-16                                        September 24, 2007  CHART #:  [id number]   HISTORY OF PRESENT ILLNESS:  Ms. Kimbler returns for follow-up related to  single vessel coronary artery disease.  She was originally seen in  consultation last week on 09/17/07.  Since then she has successfully  abstained from any cigarette smoking.  She admits that it has been  difficult, but she is happy to report that she has not smoked any  cigarettes at all.  She continues to have frequent episodes of angina.  The remainder of her review of systems is unchanged from previously.  We  spent in excess of 20 minutes discussing the indications, risks and  potential benefits of surgery.  Under the circumstances her situation is  a bit atypical but we will plan single vessel coronary artery bypass  grafting for treatment of refractory symptoms of angina pectoris.  I  have instructed her to take Plavix through the end of this week  including Saturday, 09/29/07.  We will make arrangements for her to be  admitted to Dr. Hazle Coca service on Monday, the 19th so that she can be  maintained on intravenous anticoagulation while she is off of Plavix.  We plan for surgery on Friday, 10/05/07.   Salvatore Decent. Cornelius Moras, M.D.  Electronically Signed   CHO/MEDQ  D:  09/24/2007  T:  09/24/2007  Job:  [id number]   cc:   Nanetta Batty, M.D.  Cam Hai, C.N.M.

## 2011-01-25 NOTE — Discharge Summary (Signed)
[name], Andrea               ACCOUNT NO.:  [id number]   MEDICAL RECORD NO.:  [id number]          PATIENT TYPE:  INP   LOCATION:  4730                         FACILITY:  [facility]   PHYSICIAN:  Nanetta Batty, M.D.   DATE OF BIRTH:  07/05/1961   DATE OF ADMISSION:  01/20/2009  DATE OF DISCHARGE:  01/22/2009                               DISCHARGE SUMMARY   DISCHARGE DIAGNOSES:  1. Unstable angina.  2. Known coronary disease with catheterization this admission showing      90% distal posterior descending artery lesion being treated      medically.  3. Coronary artery bypass grafting x1 to her right coronary artery in      2009.  4. Type 2 diabetes.  5. Treated hypertension.  6. Obesity.  7. History of statin intolerance.   HOSPITAL COURSE:  [name] is a 50 year old female who has been seen by  our group with recurrent chest pain.  She ultimately underwent bypass  surgery x1 because of recurrent RCA disease.  After that, she underwent  stenting to the graft in March 2010 by Dr. Jacinto Halim.  She was seen in the  office by Dr. Clarene Duke on Jan 19, 2009 with some recurrent chest pain.  She has had Cardiolite as an outpatient on December 10, 2008 that was  negative for ischemia.  I do not believe she has disease in the left  system.  She was admitted for diagnostic catheterization and further  evaluation.  She does have a history of IV contrast issues and was  premedicated with steroids and Pepcid.  Diagnostic catheterization on  Jan 21, 2009 showed 40% distal left main, 60% patent circumflex stent,  90% PDA after the graft insertion and diffusely diseased RCA.  LV  function was normal.  Previous iliac stents were patent.  Plan is for  continued medical therapy.  It was felt that she could be discharged on  Jan 22, 2009.   LABORATORY DATA:  White count 13.9, hemoglobin 11.6, hematocrit 33.8,  and platelets 324.  Sodium 139, potassium 3.4, BUN 20, and creatinine  0.77.  TSH is 0.34, repeat  was 0.56.   DISCHARGE MEDICATIONS:  1. Aspirin 81 mg a day.  2. Lopressor 100 mg twice a day.  3. Plavix 75 mg a day.  4. NovoLog 70/30 75 units in the morning and 45 units in the evening.  5. Hydrochlorothiazide 12.5 mg a day.  6. Potassium 10 mEq a day.  7. Verapamil 120 mg a day.  8. Ranexa 500 mg b.i.d.  9. Nitroglycerin 0.4 mg p.r.n.  10.Lisinopril 20 mg a day.  11.Oxycodone 10 mg one q.4 p.r.n.  12.Protonix 40 mg a day.  13.Imdur 30 mg a day.   DISPOSITION:  The patient is discharged in stable condition and will  follow up with Dr. Allyson Sabal in couple of weeks.      Abelino Derrick, P.A.      Nanetta Batty, M.D.  Electronically Signed    LKK/MEDQ  D:  03/19/2009  T:  03/20/2009  Job:  [id number]

## 2011-01-25 NOTE — Assessment & Plan Note (Signed)
[name] is back regarding her chronic headaches.  We increased her  Topamax at the last visit, but she did not notice a great change with  this.  She had no intolerance of the medication apparently.  She did  felt that the Rozerem helped, although she has only tried it for a few  weeks with the trazodone.  She uses Percocet for breakthrough pain  10/325.  She has had a ductal removal surgery last week and had some  pain associated with this.  She did not need anything extra besides what  we were providing her.   The patient rates her pain at 8/10.  Pain is in the frontal and  occipital areas of the head.  She is also having some low back and leg  pain.  She does report that she will be seeing Dr. Allyson Sabal again later  this fall regarding splinting for her legs.   REVIEW OF SYSTEMS:  Notable for numbness, anxiety, night sweats, high  sugars, and sleep apnea.  Full review is in the written health and  history section of the chart.   SOCIAL HISTORY:  The patient is married.  No new changes noted today.   PHYSICAL EXAMINATION:  VITAL SIGNS:  Blood pressure 126/78, pulse is 85,  respiratory rate 18, and she is sating 98% on room air.  GENERAL:  The patient is pleasant, alert, and oriented x3.  Affect is  bright and appropriate.  EXTREMITIES:  Normal gait without antalgia today.  She has distal  sensory loss in the fingers and lower extremities below the calves.  Strength is generally 5/5.  She has no skin color changes in the legs.  Cranial nerve exam was normal except for some loss of vision still in  the right eye.  She has intact extraocular eye movements.  Cognitively,  intact with good memory, insight, awareness, focus, etc.   ASSESSMENT:  1. 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  2. Hypertension.  3. Chronic cardiovascular disease in the setting of diabetes.   PLAN:  1. Continue Topamax 300 mg at bedtime.  2. Encourage  a retrial of Rozerem 8 mg at bedtime with increased dose      of trazodone 200 mg.  Observe for response.  I would like her to      try the Rozerem for at least 4-6 weeks.  3. Refill Percocet 10/325 one q.6 h. p.r.n., #120.  4. Begin trial of Savella titrating up to 50 mg b.i.d. over 32-month      time.  We discussed the effects and side effects of the medication.  5. Could consider valproic acid trial.  6. I will see her back in about a month.      Ranelle Oyster, M.D.  Electronically Signed     ZTS/MedQ  D:  05/21/2008 13:19:01  T:  05/22/2008 03:52:16  Job #:  [id number]   cc:   Cam Hai, C.N.M.

## 2011-01-25 NOTE — Cardiovascular Report (Signed)
[name], [name]               ACCOUNT NO.:  [id number]   MEDICAL RECORD NO.:  [id number]          PATIENT TYPE:  OBV   LOCATION:  6524                         FACILITY:  [facility]   PHYSICIAN:  [name], M.D.   DATE OF BIRTH:  1960-12-12   DATE OF PROCEDURE:  DATE OF DISCHARGE:                            CARDIAC CATHETERIZATION   [name] is a 50 year old mildly overweight Caucasian female history of  CAD and PAD.  She underwent single-vessel coronary artery bypass  grafting with a vein graft to her distal right Dr. Tressie Stalker,  October 05, 2007 because of a highly diseased right coronary artery not  approachable by percutaneous approach with ongoing angina.  She has had  stents to her circumflex as well.  She does have mild bilateral renal  artery stenosis and a patent right common iliac artery stent putting the  several years ago.  Her other problems include discontinued tobacco  abuse, hypertension, hyperlipidemia, and insulin-dependent diabetes.  She has complained of function limiting left lower extremity  claudication.  Dopplers performed March 26, 2008 showed a left ABI 0.77  with a high frequency signal at the origin of left common iliac artery.  She presents now for elective left iliac PTA and stenting.   PROCEDURE DESCRIPTION:  The patient was brought to the second floor  [facility] in the postabsorptive state.  She was  premedicated with p.o. Valium, IV Benadryl, Pepcid, and Solu-Medrol for  contrast allergy prophylaxis as well as Versed.  Both groins were  prepped and shaved in the usual sterile fashion.  Xylocaine 1% was used  for local anesthesia.  A 6-French sheath was inserted into both femoral  arteries using standard Seldinger technique (30 cm long brake tip on the  left).  A 5-French tennis racket catheter was used for abdominal  aortography.  Visipaque dye was used entirety of the case.  Aortic  pressure was monitored during the  case.   ANGIOGRAPHIC RESULTS:  1. Abdominal aorta.      a.     Renal arteries - normal.      b.     Infrarenal abdominal aorta - moderate atherosclerotic       changes.  2. Left lower extremity.      a.     A 90% ostial left common iliac artery stenosis.  3. Right lower extremity; widely patent right common iliac artery      stent.   PROCEDURE DESCRIPTION:  The patient received 2500 units of heparin  intravenously.  A 7 x 2 Powerflex was then placed in the origin of the  right common iliac artery within the previously placed stent.  Kissing  balloon stent technique was used.  A 7 x 24 Genesis and Opta balloon  stent premounted was then carefully positioned at the ostium of the left  common iliac artery.  The stent was deployed simultaneously with  inflation of the balloon within the right iliac stent to prevent plaque  shift.  There was an obvious waste and release.  Abdominal aortography  performed with pigtail catheter was performed at  the end of the case  revealing reduction of the 90% ostial left common iliac artery stenosis  to 0% residual.  The patient tolerated the procedure well.  There were  no hemodynamic, electrocardiographic sequela.   IMPRESSION:  Successful percutaneous transluminal angioplasty stenting  of the left common iliac artery ostium using Kissing balloon  technique.  The wires were removed.  The sheath was then securely in  placed.  The patient left the lab in stable condition.  ACT will be  measured.  If less than 200, sheath will be removed and pressure will be  held on the groin to achieve hemostasis.  The patient will be gently  hydrate overnight, remain recumbent for 6 hours.  She will be discharged  home in the morning with aspirin and Plavix.  She will obtain followup  Dopplers and ABIs and we will see her back in the office.      [name], M.D.  Electronically Signed     JB/MEDQ  D:  07/01/2008  T:  07/01/2008  Job:  [id number]   cc:    [facility]  [facility]  [facility]

## 2011-01-25 NOTE — Cardiovascular Report (Signed)
NAMESHRONDA, [name]               ACCOUNT NO.:  [id number]   MEDICAL RECORD NO.:  [id number]          PATIENT TYPE:  OIB   LOCATION:  4730                         FACILITY:  [facility]   PHYSICIAN:  [name], M.D.   DATE OF BIRTH:  [birth year]   DATE OF PROCEDURE:  DATE OF DISCHARGE:                            CARDIAC CATHETERIZATION   [name] is a 50 year old white female, history of CAD, status post  multiple interventions in the past including AV groove circumflex and  ostium of her PDA vein graft by [name] most recently on November 21, 2008.  Her other problems include diabetes, hypertension, and PVOD,  status post bilateral iliac artery PTA and stenting.  She saw [name]  in the office 2 days ago complaining of chest pain and was admitted for  diagnostic coronary angiography to define an anatomy and rule out  ischemic etiology.   DESCRIPTION OF PROCEDURE:  The patient was brought to the Second Floor  [facility] in the postabsorptive state.  She was  premedicated with p.o. Valium, IV Versed, and fentanyl as well as Solu-  Medrol, Pepcid, and Benadryl for contrast allergy prophylaxis.  Her  right groin was prepped and shaved in the usual sterile fashion.  Xylocaine 1% was used for local anesthesia.  A 6-French sheath was  inserted into the right femoral artery using standard Seldinger  technique.  A 6-French right and left Judkins diagnostic catheter as  well as 6-French pigtail catheter were used for selective coronary  angiography, left ventriculography, selective vein graft angiography,  and distal abdominal aortography.  Visipaque dye was used for the  entirety of the case.  Retrograde aortic, left ventricular, and pullback  pressures were recorded.   HEMODYNAMICS:  1. Aortic systolic pressure 181, diastolic pressure 91.  2. Left ventricle systolic pressure 189, end-diastolic pressure 18.   SELECTIVE CORONARY ANGIOGRAPHY:  1. Left main; left main  had 40% distal tapered stenosis similar to      prior angiograms.  2. LAD; free of significant disease.  3. Circumflex; 60% ostial hypodense circumflex.  The mid AV groove      circumflex stent was widely patent.  4. Right coronary artery; dominant with diffuse 90% stenosis      throughout the entirety of the vessel up to the crux.  5. Vein graft to the PDA; widely patent with a patent aorto-ostial      stent.  The PDA itself did have a 90% stenosis after vein graft      insertion.   LEFT VENTRICULOGRAPHY:  RAO left ventriculogram was performed using 25  mL of Visipaque dye at 12 mL per second.  The overall LVEF was estimated  greater than 60% without focal wall motion abnormalities.   DISTAL ABDOMINAL AORTOGRAPHY:  Performed using 20 mL of Visipaque dye at  20 mL per second.  The infrarenal abdominal aorta was tapered, but not  obstructive or aneurysmal.  The bilateral iliac stents were patent as  well at most 30% in-stent restenosis within the left common iliac  artery stent.   IMPRESSION:  [name] has patent stents with mild left main proximal  circumflex disease as well as posterior descending artery disease, which  I think it is small for percutaneous intervention with stenting.  At  this point, continue medical therapy will be recommended.   The sheath was removed and pressure was held in the groin to achieve  hemostasis.  The patient left the lab in stable condition.  Should be  noted that right femoral access obtained with a SMART needle.      [name], M.D.  Electronically Signed     [name]/MEDQ  D:  01/21/2009  T:  01/22/2009  Job:  [id number]   cc:   Second [facility]  [facility]  [facility]

## 2011-01-25 NOTE — Assessment & Plan Note (Signed)
OFFICE VISIT   [name], [name]  DOB:  07-08-1961                                        October 31, 2007  CHART #:  [id number]   HISTORY OF PRESENT ILLNESS:  [name] returns for routine followup  status post off-pump coronary artery bypass grafting x1 on 10/05/07.  Her postoperative recovery has been uneventful.  Following hospital  discharge, [name] has continued improved uneventfully.  She has not  yet been seen in followup by Dr. Allyson Sabal but she is scheduled to see him  tomorrow.  She states that since she has gone home she has done very  well.  She continues to abstain from any tobacco use.  She has mild  soreness in her chest but this continues to gradually improve.  She  denies any fevers, chills, or productive cough.  She has not had any  shortness of breath.  She denies any sensation of clicking or motion of  the sternum.  Her appetite is improving and overall she is getting along  quite well.  She states that she has not had any further problems with  frequent episodes of angina pectoris or chest pain similar to that which  was prior to her recent surgery.  She states that she might have had one  very brief episode of discomfort when she got very upset on one  occasion, but otherwise she has had absolutely no episodes of pain that  are even remotely similar to the angina which she suffered from before.  She has no complaints and overall she is getting along well.  Her  medications remains unchanged from the time of hospital discharge.   PHYSICAL EXAMINATION:  Physical exam is notable for a well-appearing  female with blood pressure 117/86, pulse 90, oxygen saturation 96% on  room air.  Examination of the chest reveals a median sternotomy incision  that is healing nicely.  A small suture is clipped from the upper end of  the incision but otherwise the incision is healing completely.  Lungs:  Breath sounds are clear.  Auscultation is  symmetrical bilaterally.  No  wheezes or rhonchi are noted.  Cardiovascular:  Exam includes regular  rate and rhythm.  No murmurs, rubs, or gallops are noted.  Abdomen:  Soft and nontender.  Extremities:  Warm and well perfused.  There is no  lower extremity edema.  The small incision from endoscopic vein harvest  has healed nicely.  The remainder of her physical exam is unrevealing.   DIAGNOSTIC TEST:  Chest x-ray obtained today is reviewed.  This  demonstrates clear lung fields bilaterally.  There are no pleural  effusions.  All the sternal wires appear intact.   IMPRESSION:  Satisfactory progress following recent coronary artery  bypass grafting.  Overall, [name] appears to be doing well and she  continues to refrain from any cigarette smoking.   PLAN:  [name] will continue to increase her physical activity as  tolerated with her only limitation at this point remaining that she  refrain from heavy lifting or strenuous use of her arms or shoulders for  at least another 2 months.  I have encouraged her to get started in the  cardiac rehab program which she plans to do.  We have discussed how  important it will remain for her to  continue to abstain from cigarette  smoking for the rest of her life.  All of her questions have been  addressed.  In the future, she will call and return to see [place] here at  [facility] only should further problems or  difficulties arise.   Salvatore Decent. Cornelius Moras, M.D.  Electronically Signed   CHO/MEDQ  D:  10/31/2007  [name]:  11/01/2007  Job:  [id number]   cc:   Nanetta Batty, M.D.  Lupita Raider, M.D.

## 2011-01-25 NOTE — Discharge Summary (Signed)
Andrea, [name]               ACCOUNT NO.:  [id number]   MEDICAL RECORD NO.:  [id number]          PATIENT TYPE:  INP   LOCATION:  4703                         FACILITY:  [facility]   PHYSICIAN:  Andrea Iba, MD   DATE OF BIRTH:  1961-05-21   DATE OF ADMISSION:  12/08/2007  DATE OF DISCHARGE:  12/09/2007                               DISCHARGE SUMMARY   HISTORY OF PRESENT ILLNESS:  [name] is a 51 year old female patient  who has coronary artery disease.  She has a history of coronary bypass  grafting x1 to her dominant right which was diffusely diseased.  She has  had her circumflex stented in the past.  She has a normal LV function.  She has insulin-dependent diabetes mellitus.  She has patent right iliac  stent with high-grade disease in her left iliac and mild bilateral renal  artery stenosis.  She apparently called our service complaining of chest  pain.  However, she apparently has had a viral illness including nausea,  vomiting, and diarrhea for approximately 3 days.  She came to the  emergency room, and she was admitted to the hospital.  Her CK-MBs were  all negative.  Her troponins were negative.  She had a mildly elevated D-  dimer.  She got a V/Q scan because of her allergy to dye.  V/Q scan was  low probability.  Her labs all were within normal limits.  We did check  amylase and lipase, and these were also checked.  Her triglycerides were  very high at 832.  Her alkaline phosphatase is 118 and her T bili was  0.2.  After all of the tests were performed, it was decided that she  could be discharged home to follow up with Dr. Nanetta Batty as an  outpatient.   DISCHARGE MEDICATIONS:  Aspirin 81 mg every day, Lopressor 50 mg twice  per day, lisinopril 10 mg a day, Crestor 10 mg a day, Zetia 10 mg a day,  Plavix 75 mg a day, Neurontin 600 mg at bedtime, NovoLog sliding scale  insulin twice a day, Percocet p.r.n., nitroglycerin when needed for  chest pain and Lovaza 1  gm twice daily.   LABORATORY DATA:  CK-MB and troponin was negative x3.  She had a  urinalysis, which was negative for any pathology.  She has some  leukocytes, microscopic just showed a few bacteria.  Lipase was 24,  amylase was 27, TSH was 2.140.  Lipid profile:  Total cholesterol 287,  triglycerides 832, HDL is 32, LDL not calculated.  BMET showed a sodium  of 140, potassium 3.7, chloride 110, glucose 247, BUN 21, creatinine  0.92.  CBC showed a hemoglobin 11.7, hematocrit 35, WBC is 8.9, and  platelets 364.  Magnesium was 2.1, D-dimer was 0.76.  V/Q scan was  negative, was low probability with a normal perfusion scan.  Chest x-ray  showed no acute cardiopulmonary process.   DISCHARGE DIAGNOSES:  1. Chest pain, not cardiac ischemia with negative CK-MBs and      troponins, may be musculoskeletal or may be gastrointestinal  related with history of nausea, vomiting, and diarrhea.  Viral      illness for 3 days prior.  2. Viral illness.  She was able to eat a meal and keep it down at the      time of discharge.  3. Insulin-dependent diabetes mellitus.  4. Hyperlipidemia.  Has been intolerant to statins.  5. Hypertriglyceridemia.  She was put on Lovaza, needs better control      of her blood sugar.  6. Hypertension, controlled.  7. Elevated D-dimer with low probability.  Normal perfusion V/Q scan.  8. Arteriosclerotic peripheral vascular disease.  9. Increased stress.      [name], N.P.      Andrea Iba, MD  Electronically Signed    BB/MEDQ  D:  12/09/2007  T:  12/10/2007  Job:  [id number]   cc:   Cam Hai, C.N.M.

## 2011-01-25 NOTE — Cardiovascular Report (Signed)
NAMEISRAEL, [name]               ACCOUNT NO.:  [id number]   MEDICAL RECORD NO.:  [id number]          PATIENT TYPE:  INP   LOCATION:  2036                         FACILITY:  [facility]   PHYSICIAN:  Cristy Hilts. Jacinto Halim, MD       DATE OF BIRTH:  [birth year]   DATE OF PROCEDURE:  DATE OF DISCHARGE:                            CARDIAC CATHETERIZATION   PROCEDURES PERFORMED:  1. Left ventriculography.  2. Selective left coronary arteriography.  3. Abdominal aortogram.   INDICATIONS:  Ms. Leimomi Zervas is a 50 year old female with severe  coronary artery disease.  She has undergone one-vessel coronary artery  bypass grafting with the SVG to PDA for intractable angina and diffuse  severe disease in the native right coronary artery.  She was admitted  with unstable angina.  Hence, she was brought to the cardiac  catheterization lab to reevaluate her coronary anatomy.  Abdominal gram  was performed to evaluate for abdominal atherosclerosis, as she has  bilateral common iliac artery stenting procedure done in the past and  these were abnormal showing Doppler evidence of restenoses.   HEMODYNAMIC DATA:  The ventricular pressure was 118/6 with end diastolic  pressure of 13 mmHg.  Aortic pressure was 118/71 with a mean of 92 mmHg.  There was no significant pressure gradient across the aortic valve.   ANGIOGRAPHIC DATA:  Left ventricle:  Left ventricular systolic function  was normal with ejection fraction of 60%.  No significant mitral  regurgitation.  No wall motion abnormality.   Right coronary artery:  The right coronary is severely diffusely  diseased all the way from proximal-to-distal segment.  The distal PDA is  supplied by saphenous vein graft.  Prior to the insertion of the  saphenous vein graft, there is a 90% stenosis and the PLA branch, which  are small branches are unprotected.  Right coronary is a dominant  vessel.   SVG to PDA:  SVG to PDA is patent.  However, there is about a 40%  stenosis noted in the proximal segment.   Left main coronary artery:  Left main coronary artery is a large-caliber  vessel.  It has got a 40% distal left main disease.   Left circumflex coronary artery:  Circumflex is a large-caliber vessel.  Smooth and normal.  Previously placed stent in the circumflex coronary  artery is widely patent.  Ostium shows a 40% stenoses.   LAD:  LAD is a large-caliber vessel.  It has got mild luminal  irregularity.   IMPRESSION:  1. There is definitely clear progression of atherosclerotic disease in      the bypass grafts.  She will need very aggressive risk      modification.  A total of 125 mL of contrast was utilized for      diagnostic angiography.   Abdominal aortogram:  Abdominal aortogram revealed presence of 2 renal  arteries, 1 on either side.  The abdominal aorta showed mild amount of  arteriosclerotic changes.  The aortoiliac bifurcation stent on the left  side has a in-stent 50% stenosis.   TECHNIQUE OF PROCEDURE:  Under usual sterile  precautions using a 6-  French right femoral artery access, 6-French multipurpose B2 catheter  was advanced into ascending aorta and then to the left ventricle.  Left  ventriculography was performed both in LAO and RAO projection.  Catheter  was then utilized to perform right and left coronary arteriography and  also saphenous vein graft arteriography.  Catheter pulled out of body  and abdominal aortogram was performed using a pigtail catheter.  The  catheter then pulled out of body.  The patient tolerated the procedure  well.  No immediate complications were noted.      Cristy Hilts. Jacinto Halim, MD  Electronically Signed     JRG/MEDQ  D:  11/20/2008  T:  11/21/2008  Job:  [id number]   cc:   Nanetta Batty, M.D.

## 2011-01-25 NOTE — Cardiovascular Report (Signed)
[name], [name]               ACCOUNT NO.:  [id number]   MEDICAL RECORD NO.:  [id number]          PATIENT TYPE:  INP   LOCATION:  2502                         FACILITY:  [facility]   PHYSICIAN:  Cristy Hilts. Jacinto Halim, MD       DATE OF BIRTH:  02/22/1961   DATE OF PROCEDURE:  11/21/2008  DATE OF DISCHARGE:                            CARDIAC CATHETERIZATION   PROCEDURE PERFORMED:  Intravascular ultrasound-guided percutaneous  transluminal coronary angioplasty and stenting of the saphenous vein  graft to the PDA.   HISTORY:  [name] is a 50 year old female who was admitted to the  hospital with unstable angina.  She had undergone diagnostic cardiac  catheterization by me yesterday and was found to have a hazy 40%-50%  proximal SVG to PDA stenosis.  Also, there appeared to be a high-grade  stenosis in the PDA just after the insertion of the saphenous vein graft  to the PDA.  Given her continued chest discomfort, we brought her back  for a second look at the saphenous vein graft and also the insertion  site stenosis.   ANGIOGRAPHIC DATA:  The saphenous vein graft to the right coronary  artery again showed a hazy 50% stenosis in the proximal segment.  This  was not relieved with intracoronary nitroglycerin.  The PDA insertion  site appeared to be stable and I did not think there was a high-grade  stenosis.  There may have been at least a 30%-40% stenosis, however, the  PDA itself is very small and severely tortuous.   IVUS DATA:  The intravascular ultrasound interrogation of the saphenous  vein graft revealed severe tight stenosis in the proximal segment  measuring 12 mm.  The vessel itself measured 2.6 x 3.0 mm in diameter.  This was high-grade, with the plaque abutting the IVUS catheter.   INTERVENTION DATA:  Successful PTCA and direct stenting of the SVG to  PDA with implantation of 3.0 x 15 mm Endeavor stent deployed at around  12 atmospheric pressure for 30 seconds and the stent  balloon was gently  pulled back and a second inflation was performed.  This was done at 16  atmospheric pressure for 40 seconds.  The stent was implanted right from  the graft origin with excellent results.   RECOMMENDATIONS:  She will need continued risk modification and  lifestyle changes.   A total of 125 mL of contrast was utilized for diagnostic and  interventional procedure.   TECHNIQUE OF THE PROCEDURE:  Under usual sterile precautions using a 6-  French right femoral arterial access, a 6-French multipurpose B2  catheter was advanced to the ascending aorta, and the saphenous vein  graft to the PDA branch was selectively cannulated.  Angiography was  performed.  Intracoronary nitroglycerin was also administered.   Utilizing heparin for anticoagulation, a 6-French AR-1 with side hole  guide catheter was utilized to engage the SVG to PDA.  Then, using ATW  guidewire and Galaxy IVUS catheter, the intravascular ultrasound  interrogation of the saphenous vein grafts were performed.   The ostium of the saphenous vein graft had severe disease  as dictated  above.  We decided to stent this with 3.0 x 15 mm Endeavor as the lesion  length measured 12 mm.  We stented this at high pressures of 14 and also  second inflation within the stent and into the aortic root stent balloon  jetting into the aortic root at 16 atmospheric pressure  for 40 seconds and angiography was repeated.  Excellent results were  noted.  Guidewire was withdrawn angiographically.  The guide catheter  disengaged and pulled out of the body.  The patient tolerated the  procedure.  No immediate complications.      Cristy Hilts. Jacinto Halim, MD  Electronically Signed     JRG/MEDQ  D:  11/21/2008  T:  11/22/2008  Job:  [id number]   cc:   Lupita Raider, M.D.  Nanetta Batty, M.D.

## 2011-01-25 NOTE — Procedures (Signed)
[name], PACINI               ACCOUNT NO.:  [id number]   MEDICAL RECORD NO.:  [id number]          PATIENT TYPE:  INP   LOCATION:  2910                         FACILITY:  [facility]   PHYSICIAN:  Nanetta Batty, M.D.   DATE OF BIRTH:  01/18/1961   DATE OF PROCEDURE:  07/23/2009  DATE OF DISCHARGE:                    PERIPHERAL VASCULAR INVASIVE PROCEDURE   PROCEDURE:  Carotid angiogram/carotid stent.   PRIMARY OPERATOR:  Dr. Juleen China.   PROCTOR/CARDIOLOGIST:  Dr. Nanetta Batty.   INDICATIONS FOR PROCEDURE:  Jenniefer is an unfortunate 50 year old,  mildly overweight married Caucasian female with a history of coronary  artery disease and PV OD.  She is status post coronary artery bypass  grafting x1 with the vein graft to her distal right coronary artery by  Dr. Durward Mallard on 10/05/2007, because of non-re-vascularizable RCA  with ongoing angina.  She has had stenting of her mid-circumflex as well  as both iliac arteries.  Other problems include tobacco abuse,  hypertension, hyperlipidemia and insulin-dependent diabetes.  She had a  right body/left brain stroke, which resolved on 07/10/2009.  Doppler  studies in our office showed mildly high-grade left ICA stenosis.  MRI  was positive for left-sided stroke, as well as some right-sided  abnormalities as well.  She was seen by Dr. Marlowe Shores, who felt  she  was a good candidate for carotid artery stenting, given the fact that  she was a high risk for an endarterectomy.  She has been on aspirin and  Plavix.  She presents now for an angiography and carotid artery  stenting.  Dr. Durene Cal was the primary operator and I served as  Best boy.   DESCRIPTION OF PROCEDURE:  The patient was brought to the second floor  [facility] Pediatric Operative Suite in the post-absorptive  state.  She was not premedicated.  Her right groin is prepped and shaved  in the usual sterile fashion.  Xylocaine 1% was used for local  anesthesia.  A 5-French sheath was inserted into the right femoral  artery using the standard Seldinger technique and a Doppler-tipped Smart  needle with an 0.035 Wholey wire.  The 5-French pigtail catheter as well  as a JV-1 catheter were used for arch angiography, selective right and  left vertebral and right and left carotid angiography, with both  intracranial and extracranial shots.  Visipaque dye was used for the  entirety of the case.  Retrograde aortic pressures monitored during the  case.   ANGIOGRAPHIC RESULTS:  1. Arch aortogram      a.     Type 1 notch.  2. Right and left vertebral widely patent and normal intracranially      and extracranially.  3. Right carotid:  There was a 30% stenosis at the origin of the right      internal carotid artery.  4. Left carotid:  There was a 70% __________ calcified stenosis at the      origin of the left internal carotid artery.  There was also a 90%      stenosis at the origin of the left external carotid artery.   IMPRESSION:  Moderately high-grade left internal carotid artery stenosis  in a patient with symptomatic carotid disease, high risk for  endarterectomy.  Will proceed with a percutaneous transluminal  angioplasty and stenting under the CHOICE protocol with Abbott 8.10  tapered 4 cm stent, along with a large Embo shield.   DESCRIPTION OF PROCEDURE:  The 5-French sheath was exchanged over a wire  for a 6-French shallow sheath.  A 6-French Shuttle JB-1 catheter was  used to gain access to the left common carotid artery.  A 0.035 stiff  Amplatz wire was then used to advance the JB-1 catheter into the body of  the common carotid, over which the Shuttle sheath was advanced.  The  patient received Angiomax bolus with an AST of greater than 300.  The JB-  1 catheter and Amplatz wire were then removed.  The Embo shield was then  advanced across the stenosis into the body of the left ICA and it was  deployed successfully.  The ICA  stenosis was then pre-dilated with a 3 x  2 balloon and nominal pressures, and an 8.10 tapered 4 cm long Abbott  Exact stent was then deployed under careful angiographic and  fluoroscopic control across the stenosis.  Post-dilatation was performed  with a 5.2 balloon twice, beginning at 6 atmospheres and up to 8  atmospheres.  The patient did become transient bradycardic which  resolved spontaneously.  Scout angiogram revealed the stent to be  accurately deployed.  The Embo shield was then retrieved successfully.  The final angiographic result was the reduction of a 70% calcified left  ICA stenosis to 15% residual without dissection.  There was some  compromise at the origin of the external parotid.   The patient tolerated the procedure well.  The sheath was then withdrawn  and exchanged over a Wholey wire for a 7-French short sheath.  Angiomax  was turned off.  The patient received an additional 300 mg of p.o.  Plavix.   The patient left the lab in stable condition.  She will be gently  hydrated overnight.  She will be discharged home in the morning.  We  will get follow-up Dopplers in our office.      Nanetta Batty, M.D.  Electronically Signed     JB/MEDQ  D:  07/23/2009  T:  07/23/2009  Job:  [id number]   cc:   Paula Compton, M.D.  Juleen China, M.D.  Marlowe Shores, M.D.

## 2011-01-25 NOTE — Cardiovascular Report (Signed)
[name], SOULIER NO.:  [id number]   MEDICAL RECORD NO.:  [id number]          PATIENT TYPE:  INP   LOCATION:  2807                         FACILITY:  [facility]   PHYSICIAN:  Nanetta Batty, M.D.   DATE OF BIRTH:  1960-12-30   DATE OF PROCEDURE:  06/27/2007  DATE OF DISCHARGE:                            CARDIAC CATHETERIZATION   Ms. Magno is a 50 year old female with history of circumflex stenting in  March 2007 with moderate disease in her right.  Her other problems  include peripheral vascular disease, status post right common iliac  artery stenting, insulin-dependent diabetes, treated dyslipidemia.  She  has had progressive chest pain and presents now for diagnostic coronary  arteriography to define anatomy and rule out ischemic etiology.   DESCRIPTION OF PROCEDURE:  The patient brought to the second floor [facility] cardiac catheterization lab in the postabsorptive state.  She was  premedicated with p.o. Valium, IV Versed and fentanyl.  Her right groin  was prepped and shaved in the usual sterile fashion.  One percent  Xylocaine was used for local anesthesia.  A 6- upgraded to a 7-French  sheath was inserted into the right femoral artery using standard  Seldinger technique.  A 6-French right and left Judkins diagnostic  catheters as well as French pigtail catheter were used for selective  cholangiography, left ventriculography and distal abdominal aortography.  Visipaque dye was used to the entirety of the case.  Retrograde Aorta  and left ventricular  blood pressures were recorded.   HEMODYNAMIC RESULTS:  1. Aortic systolic pressure 132, diastolic pressure 83.  2. Left ventricular systolic pressure 132, end-diastolic pressure 7.   SELECTIVE CORONARY ANGIOGRAPHY:  1. Left main normal limits.  2. LAD:  LAD had approximately 30% stenosis of the proximal portion      just after the takeoff of the first diagonal branch.  3. Left circumflex:  This still had a  40% ostial lesion.  The stent in      the mid circumflex was patent, has 80% hypodense lesion just distal      to this.  4. Right coronary:  This was a serpiginous dominant vessel with 80%      proximal and mid stenoses.  This also had 80% focal PDA stenosis.   LEFT VENTRICULOGRAPHY:  Left ventriculography:  RAO left ventriculogram  was performed using 25 mL of Visipaque dye at 12 mL per second.  The  LVEF was estimated at greater than 50% without focal wall motion  abnormalities.   ABDOMINAL AORTOGRAPHY:  Distal abdominal aortogram was performed using  20 mL of Visipaque dye at 20 mL per second x2.  The renal arteries had  approximately 40% bilateral proximal renal artery stenosis.  Right iliac  stent was widely patent.  There was a focal ostial left common iliac  artery stenosis.  The patient does complain of left lower extremity  claudication.   IMPRESSION:  Ms. Sawatzky has new lesion in the mid circumflex after her  stent as well as what appears to be progression of disease in her right.  We will plan  on intervening on her circumflex today with consideration  of staged RCA intervention.  She will also need her left common iliac  artery stented as well as for the future.   The 6-French sheath in the right femoral artery was exchanged over wire  for a 7-French sheath.  The patient was on aspirin and  Plavix and  received an additional 300 mg p.o. Plavix, Angiomax as well as Angiomax  bolus with an ACT of 354.   Using a 7-French XP 3.5 guiding catheter along with a __________ ASAI  soft wire into 512 Maverick angioplasty was performed.  Because of  suboptimal angiographic result, stenting was performed with a 3 x 15  __________  stent at 14 atmospheres (3.23 mm resulting in reduction of  80% lesion to 0% residual.  There was a small focal napkin ring lesion  and approximately 20% hypodense just after the distal edge of the stent.  This was not flow limiting nor did it look like a   dissection.   IMPRESSION:  Successful percutaneous coronary intervention PCI stenting  of mid atrioventricular groove circumflex at the edge of the previously  placed Cypher stent.  The patient tolerated procedure well.  The  guidewire and catheter removed.  Sheath was sewn securely in place.  The  patient left the cath lab in stable condition.  The sheath will be  removed in 2 hours.  The patient will be discharged home in the morning.  Will see me back in the office for further evaluation and consideration  of staged RCA intervention.      Nanetta Batty, M.D.  Electronically Signed     JB/MEDQ  D:  06/27/2007  T:  06/28/2007  Job:  [id number]   cc:   Second Floor [place] [facility] Lab  [facility]  [facility]

## 2011-01-25 NOTE — Discharge Summary (Signed)
[name], [name]               ACCOUNT NO.:  [id number]   MEDICAL RECORD NO.:  [id number]          PATIENT TYPE:  INP   LOCATION:  6531                         FACILITY:  [facility]   PHYSICIAN:  Nanetta Batty, M.D.   DATE OF BIRTH:  11-Apr-1961   DATE OF ADMISSION:  06/27/2007  DATE OF DISCHARGE:                               DISCHARGE SUMMARY   DISCHARGE DIAGNOSES:  1. Unstable angina, circumflex Promus stent placement this admission.  2. Residual right coronary artery and left iliac disease.  3. Insulin-dependent diabetes.  4. Dyslipidemia, currently off statin secondary to leg pain.  5. History of smoking, the patient quit 3 weeks ago.  6. Treated hypertension.   HOSPITAL COURSE:  The patient is a pleasant 50 year old female with a  history of coronary disease and diabetes.  She had a circumflex stent  placed in March of 2007.  She has peripheral vascular disease and had a  previous right common iliac artery stent in November of 2006.  She was  seen in the office June 25, 2007 as an add-on for chest pain.  She  had been complaining of some left-sided chest discomfort and left arm  pain and nausea and increasing dyspnea and fatigue for a couple of  weeks.  She recently had a negative Myoview in March of 2008.  She was  admitted for diagnostic catheterization.  This was done June 27, 2007  by Dr. Allyson Sabal.  Catheterization revealed 80% proximal RCA, 80% mid RCA,  and 50% distal RCA.  The circumflex had a 40% proximal narrowing.  The  previously placed circumflex stent after the OM was patent.  Just after  the stent was a new 80% stenosis.  The LAD was patent with a 30%  narrowing.  The renal artery showed 40% bilateral renal artery  narrowing.  Previously placed right common iliac artery stent was patent  with an 80% left common iliac artery narrowing.  She underwent PCI to  the circumflex with a Promus stent.  The plan is to bring her back for a  staged intervention to the RCA  and left common iliac artery.  The  patient tolerated the procedure well.  We feel she can be discharged  June 28, 2007.  She had previously been on Zocor.  This was stopped  by her primary care doctor for leg pain.  Will go ahead try her on  Crestor.   DISCHARGE MEDICATIONS:  1. Imdur 30 mg a day.  2. Amlodipine 10 mg a day.  3. Lisinopril 40 mg a day.  4. Aciphex 20 mg a day.  5. Atenolol 100 mg b.i.d.  6. Plavix 75 mg a day.  7. Aspirin 81 mg a day.  8. Nicotine patch.  9. NovoLog 70/30 75 units in the morning.  10.Crestor 5 mg a day.  11.Nitroglycerin sublingual p.r.n.   LABORATORY DATA:  White count 9.5, hemoglobin 12.7, hematocrit 37.2,  platelets 358.  Sodium 137, potassium 4.1, BUN 29, creatinine 1.12.  CK-  MB and troponins were negative.   Chest x-ray done on June 27, 2007 showed mild bronchitic changes.  No  acute disease.  EKG shows sinus rhythm without acute changes.   DISPOSITION:  The patient is discharged in stable condition and will  follow up with Dr. Allyson Sabal as an outpatient.      Abelino Derrick, P.A.      Nanetta Batty, M.D.  Electronically Signed    LKK/MEDQ  D:  06/28/2007  T:  06/29/2007  Job:  [id number]   cc:   [facility]

## 2011-01-25 NOTE — Discharge Summary (Signed)
[name], [name]               ACCOUNT NO.:  [id number]   MEDICAL RECORD NO.:  [id number]          PATIENT TYPE:  INP   LOCATION:  4714                         FACILITY:  [facility]   PHYSICIAN:  Leighton Kowalski McDiarmid, M.D.DATE OF BIRTH:  1961/01/12   DATE OF ADMISSION:  04/14/2008  DATE OF DISCHARGE:  04/15/2008                               DISCHARGE SUMMARY   PRIMARY CARE Ladavion Savitz:  Dr. Philipp Deputy at [facility].   DISCHARGE DIAGNOSES:  1. Noncardiac chest pain.  2. Viral gastroenteritis.  3. Diabetes mellitus.  4. Thrush.  5. Chronic headache.  6. Mastitis.  7. Asymptomatic bacteruria.  8. Gastroesophageal reflux disease.  9. Peripheral vascular disease.   DISCHARGE MEDICATIONS:  1. Diflucan 100 mg 1 tab p.o. daily x14 days.  2. Tamiflu 75 mg p.o. twice daily for 5 days.  3. Lisinopril 20 mg 1 tab p.o. daily.  4. Lopressor 50 mg 1 tab p.o. twice daily.  5. Plavix 75 mg 1 tab p.o. daily.  6. Lovaza 1 tab p.o. twice daily.  7. Crestor 20 mg 1 tab p.o. daily.  8. Aciphex 20 mg 1 tab p.o. daily.  9. Zetia 10 mg 1 tab p.o. daily.  10.Humulin 70/30 75 units in the a.m. and sliding scale insulin      coverage at night.   CONSULTS:  [facility]iology was consulted.  Their recommendations  were outpatient followup after ruling out MI.   PROCEDURES:  EKG showed new right bundle branch block.  VQ scan with low  probability for PE.   LABS:  Cardiac enzymes negative x2.  Urinalysis showed positive  nitrites, negative leukocytes, many bacteria, and 21-50 white blood  cells.  Hemoglobin A1c 11.9.   BRIEF HOSPITAL COURSE:  1. Chest pain.  The patient's chest pain was ruled out for PE with a      negative VQ scan.  The patient's chest pain was ruled out for a      myocardial infarction with negative cardiac enzymes and with repeat      EKGs that showed no evidence for myocardial infarction.  Cardiology      was consulted, which recommended outpatient  followup and an      exercise stress test to rule out signs of coronary artery disease.      The patient was also started on low-dose Lovenox for prophylaxis.      Throughout her hospital course the patient's chest pain improved.      On the day of discharge the patient was still complaining of mild      chest tightness that was reproducible with palpation.  2. Flu-like symptoms.  The patient was placed on droplet isolation and      she also had an H1N1 flu swab completed.  The patient did not      appear acutely ill and was rehydrated in the hospital.  The patient      denied any nausea or vomiting, diarrhea.  The patient remained      afebrile with no cough or rhinorrhea.  The patient's symptoms  seemed most consistent with viral gastroenteritis.  On the day of      discharge the patient was tolerating p.o.'s and remained stable.  3. Diabetes mellitus with questionable medication noncompliance.  She      was taking different doses of the Humulin than was prescribed by      her PCP.  This was also evidenced with her elevated blood sugars on      admission and throughout hospitalization as well as the markedly      elevated hemoglobin A1c.  I recommend close followup with her      primary care [name] and readjusting her treatment regimen.  4. Headaches have been a chronic problem for this patient that has      been present for 2 years and have never gone away.  The patient was      asking for narcotic pain medications and has questionable drug      abuse behavior.  On the day of discharge the patient was still      complaining of headache but it seemed to be at her baseline.  5. Thrush.  The patient was started on nystatin swish and swallow,      which did not improve her symptoms.  The patient was then      transitioned to systemic therapy with Diflucan with a 14-day      course.  6. Mastitis was stable on admission and on discharge the patient had      close followup scheduled.   7. Asymptomatic bacteruria.  The patient remained asymptomatic      throughout her hospital stay.  On the day of discharge the patient      was complaining of mild urinary retention.  A post-void residual      was performed by the nurse, which showed 100 mL, which was      acceptable.  The patient was to have close followup and was      instructed to go to the ED or call the clinic with any new or      worsening symptoms.   DISCHARGE INSTRUCTIONS:  The patient is to have a heart-healthy, low-  carbohydrate, low-fat diet.  The patient has no restrictions on  activity.  The patient is instructed to return to the hospital or call  the clinic for any worsening of her symptoms, any increased chest pain,  shortness of breath, inability to urinate, or worsening headaches.   FOLLOW-UP APPOINTMENTS:  The patient is to follow up with Dr. Meredeth Ide [facility], phone number [phone], on April 17, 2008 at  8:45 a.m.   DISCHARGE CONDITION:  The patient was discharged home in stable medical  condition.      [name], MD  Electronically Signed      [name], M.D.  Electronically Signed    WS/MEDQ  D:  04/16/2008  T:  04/16/2008  Job:  [id number]   cc:   Lupita Raider, M.D.

## 2011-01-25 NOTE — Discharge Summary (Signed)
NAMEVENESA, [name]               ACCOUNT NO.:  [id number]   MEDICAL RECORD NO.:  [id number]          PATIENT TYPE:  INP   LOCATION:  2038                         FACILITY:  [facility]   PHYSICIAN:  [name], M.D.DATE OF BIRTH:  04/08/1961   DATE OF ADMISSION:  07/20/2007  DATE OF DISCHARGE:  07/25/2007                               DISCHARGE SUMMARY   ATTENDING PHYSICIAN:  Dr. [name].   DISCHARGE DIAGNOSES:  1. Unstable angina pectoris.  The patient underwent cath during this      admission - medical therapy with recommendations were considered      ECP therapy.  2. Known coronary artery disease status post previous intervention.  3. Peripheral vascular disease with left lower extremity intermittent      claudication.  The patient is scheduled for a PD catheter during      the next week.  4. Insulin dependent diabetes mellitus - poorly controlled.  5. Hypertension.  6. Dyslipidemia.  7. Chronic back pain secondary to degenerative disk disease.   HISTORY OF PRESENT ILLNESS:  This is a 50 year old Caucasian female,  patient of Dr. Allyson Sabal who was seen in the emergency room on July 20, 2007 with complaints of several-day substernal chest pain.  She was in  the hospital a week ago prior to presentation and had a cath with  stenting of the mid circumflex. Given her risk factors and recent cath,  the patient was admitted to the hospital on rule-out MI protocol and we  had scheduled her for coronary angiography, rule out stent restenosis.   HOSPITAL PROCEDURE:  Coronary angiography performed by Dr. Tresa Endo on  July 24, 2007. The cath revealed diffuse disease in the tortuous  RCA.  Patent stent in the mid circumflex.  A 90% stenosis and distally in the LAD as well as in the PDA.  The  patient also had 50% stenosis proximal circumflex and 20% stenosis in  the proximal to mid LAD. Ejection fraction was normal.  No intervention  was performed. Stent was patent, and the decision  was made to increase  the nitrate dose and add Ranexa.  Dr. Lorenz Coaster also suggested ECP therapy  be considered for this patient when she is discharged from the hospital.   The next morning the patient was free of pain in the chest and in the  groin site. Actually the groin site was stable without any signs of  ecchymosis or hematoma.  She had stable vital signs and was discharged  home.   LABORATORY RESULTS:  Her hospital laboratories revealed white blood cell  count 7.1, hemoglobin 10.1, hematocrit 29.7, platelets 320.  Sodium 136,  potassium 3.6, BUN 15, creatinine 0.85, glucose to 148. Hemoglobin A1c  was abnormally elevated to 9.8. Her was BNP 68.  TSH 0.878.  Cardiac  enzymes were negative x 4.   DISCHARGE MEDICATIONS:  Aspirin 81 mg daily, Plavix 75 mg daily,  lisinopril 40 mg daily, Aciphex 20 mg daily, Norvasc 10 mg daily, Ranexa  500 mg b.i.d. Crestor 5 mg daily, NovoLog 70/30 75 units b.i.d.  Lopressor 50 mg b.i.d. Imdur  120 mg daily.   DISCHARGE INSTRUCTIONS:  The patient was instructed to discontinue  atenolol.  She will call our office to confirm her appointment for  peripheral vascular cath in next week.      Raymon Mutton, P.A.    ______________________________  [name], M.D.    MK/MEDQ  D:  07/25/2007  T:  07/26/2007  Job:  [id number]   cc:   Nanetta Batty, M.D.

## 2011-01-25 NOTE — Op Note (Signed)
[name], [name]               ACCOUNT NO.:  [id number]   MEDICAL RECORD NO.:  [id number]          PATIENT TYPE:  AMB   LOCATION:  SDS                          FACILITY:  [facility]   PHYSICIAN:  Thomas A. Cornett, M.D.DATE OF BIRTH:  05/10/1961   DATE OF PROCEDURE:  04/20/2009  DATE OF DISCHARGE:  04/20/2009                               OPERATIVE REPORT   PREOPERATIVE DIAGNOSIS:  Chronic right breast nipple pain with  inflammation.   POSTOPERATIVE DIAGNOSIS:  Chronic right breast nipple pain with  inflammation.   PROCEDURE:  Excision of right nipple.   SURGEON:  Maisie Fus A. Cornett, MD   ANESTHESIA:  LMA with 0.25% Sensorcaine local.   ESTIMATED BLOOD LOSS:  5 mL.   SPECIMEN:  Right nipple and subareolar breast tissue to pathology.   DRAINS:  None.   INDICATIONS FOR PROCEDURE:  The patient is a 50 year old female who has  had a history of chronic mastitis.  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  These were managed with drainage and antibiotics over  the next year but we could never clear the infection completely despite  these measures.  She was on chronic pain medicine for pain and at that  point in time recommended excision of her left nipple.  She has now  developed similar symptoms in her right nipple.  We tried antibiotics.  She has had no large abscess to drain but unfortunately given the fact  that she continues to have right nipple pain, we discussed our options.  Since she has been through years worth of problems with the left nipple,  she wished the right one excised.  We discussed this with her.  She was  in agreement to proceed since she was requiring chronic narcotics for  treatment of her right breast nipple pain.   DESCRIPTION OF PROCEDURE:  The patient was brought to the operating room  and placed supine.  After induction of general anesthesia, the right  breast was  prepped and draped in a sterile fashion.  Curvilinear  incisions were made above and below the nipple and nipple was excised  out of its bed.  Cautery was used for any oozing or  bleeding.  I then closed this down with a 3-0 Vicryl and 4-0 Monocryl  stitch.  The nipple was sent to pathology for evaluation.  Dermabond was  applied.  All final counts of sponges, instrument, and needles were  found to be correct.  The patient was then taken to recovery room at  this point in satisfactory condition.      Thomas A. Cornett, M.D.  Electronically Signed     TAC/MEDQ  D:  04/20/2009  T:  04/21/2009  Job:  [id number]   cc:   Cam Hai, C.N.M.

## 2011-01-25 NOTE — Discharge Summary (Signed)
NAMEANE, [name]               ACCOUNT NO.:  [id number]   MEDICAL RECORD NO.:  [id number]          PATIENT TYPE:  INP   LOCATION:  2502                         FACILITY:  [facility]   PHYSICIAN:  Nanetta Batty, M.D.   DATE OF BIRTH:  1961/08/15   DATE OF ADMISSION:  11/19/2008  DATE OF DISCHARGE:  11/22/2008                               DISCHARGE SUMMARY   DISCHARGE DIAGNOSES:  1. Unstable angina.  2. Coronary artery disease with history of bypass grafting x1 vessel      to the right coronary artery in January 2009, now with 40% stenosis      of the ostial area of the saphenous vein graft to the posterior      descending artery, though with the intracoronary vascular      ultrasound guided procedure it was actually a 90% lesion in this      area and the patient underwent percutaneous transluminal coronary      angioplasty with stent deployment with an Endeavor stent to the      saphenous vein graft to the posterior descending artery.      a.     Also, she does have patent stent to the circumflex and       nonobstructive disease to the distal left main and the ostial       circumflex.  3. Peripheral vascular disease with bilateral iliac stenting.  4. Dyslipidemia.  5. Hypertension.  6. Need for left breast surgery in the future with Dr. Luisa Hart.  7. Diabetes mellitus, insulin dependent.   DISCHARGE CONDITION:  Improved.   PROCEDURE:  1. Combined left heart catheterization on November 20, 2008, by Dr. Yates Decamp.  2. Percutaneous transluminal angioplasty and stent deployment with      Endeavor stent to the saphenous vein graft to the posterior      descending artery by Dr. Yates Decamp.   DISCHARGE MEDICATIONS:  1. Doxycycline 100 mg twice a day.  2. Aspirin 325 mg enteric coated for 1 month, then decrease to 2 of 81      mg tablets daily.  3. Lisinopril 20 mg daily.  4. Metoprolol 50 mg 3 times a day.  5. Crestor 20 mg daily.  6. Plavix 75 mg daily.  7. Zetia 10 mg  daily.  8. NovoLog insulin 70/30 37.5 units twice a day as before.  9. Nitroglycerin 0.4 mg sublingual as needed for chest pain.  10.Aciphex 20 mg daily.  11.Hydrochlorothiazide 12.5 mg daily.  12.Lovaza 1 g twice a day.  13.Verapamil SR 120 mg twice a day.  14.Do not stop Plavix, stopping could cause a heart attack.  15.Hydrocodone 10/500 one tab every 4 hours as needed for pain.   DISCHARGE INSTRUCTIONS:  1. Low-sodium, heart-healthy diabetic diet.  Wash cath site with soap      and water.  Call office if any bleeding, swelling, or drainage.  2. Increase activity slowly.  May shower.  No lifting for 2 days.  No      driving for 2 days.  3. Follow up with Dr.  Berry in 2 weeks.  Office will call with date      and time.   HISTORY OF PRESENT ILLNESS:  The patient was admitted with chest pain,  with a history of coronary artery disease and single-vessel bypass  grafting in January 2009.  She had presented to the office on November 19, 2008, with chest pain and left neck that was pretty constant per 2 days.  She took nitroglycerin with some relief.  She was sent to the emergency  room.   PAST MEDICAL HISTORY:  See problem list.   ALLERGIES:  PENICILLIN.   FAMILY HISTORY, SOCIAL HISTORY, REVIEW OF SYSTEMS:  See H&P.   PHYSICAL EXAMINATION ON DISCHARGE:  Blood pressure 156/67-123/79,  temperature 98.1, pulse 88.  Heart and lungs are clear.  Right groin  cath site was stable.   LABORATORY DATA:  Hemoglobin on admission 15, hematocrit 43.7, WBC 10.8,  platelets 300, neutrophils 67, lymph 29, monos 3, eos 1, baso 1.  These  remained stable.  The white count was slightly elevated post procedure,  11.1.  Protime 12, INR 0.9, PTT 27.   Sodium 135, potassium 4.5, chloride 106, CO2 of 22, glucose initially  was 493, BUN 20, and creatinine 0.75.  She was treated with insulin.  At  discharge, sodium 139, potassium 3.6, chloride 105, CO2 of 24, glucose  179, BUN 14, creatinine 0.83, total  protein 6.9, albumin 3.7, AST 10,  ALT 8, alkaline phos 114, and total bili 0.5.   Cardiac enzymes, CK 25, 28, 47, 29, 25; MBs 1.6, 0.8, 1.3, 1.2; and  troponin Is were all negative for MI, 0.04 and less than 0.01.   TSH 0.62.   EKG this admission, sinus tach, rate of 124 with left anterior  fascicular block; followup on the 12th sinus rhythm, no acute changes;  and on the 13th sinus rhythm, normal EKG.  The patient was admitted,  underwent procedure with intervention and by November 20, 2008, she did  develop some hypotension, glucose was elevated.  She was given extra  insulin and given fluid bolus for the hypotension.  She underwent  cardiac catheterization with Dr. Jacinto Halim with stent placement as  described.  By the next morning on November 22, 2008, she was stable and  ready for discharge and will follow up as an outpatient.  She did  ambulate with cardiac rehab and did well.      Darcella Gasman. Annie Paras, N.P.      Nanetta Batty, M.D.  Electronically Signed    LRI/MEDQ  D:  01/01/2009  T:  01/02/2009  Job:  [id number]   cc:   Thomas A. Cornett, M.D.  [facility]

## 2011-01-25 NOTE — H&P (Signed)
[name], [name]               ACCOUNT NO.:  [id number]   MEDICAL RECORD NO.:  [id number]          PATIENT TYPE:  INP   LOCATION:  2011                         FACILITY:  [facility]   PHYSICIAN:  Nanetta Batty, M.D.   DATE OF BIRTH:  [birth year]   DATE OF ADMISSION:  09/30/2007  DATE OF DISCHARGE:                              HISTORY & PHYSICAL   HISTORY OF PRESENT ILLNESS:  [name] is a 50 year old white married  female patient with a prior medical history of coronary artery disease,  peripheral vascular disease, insulin-dependent diabetes mellitus,  hypertension, hyperlipidemia. She comes to the hospital today for  Integrilin infusion prior to having coronary artery bypass grafting on  Friday. She apparently had a Perma stent placed on June 27, 2007 to  her circumflex lesion. At that time she was noted to have a RCA disease  scattered, not amenable to intervention. Because of refractory angina,  she was sent to see Dr. Cornelius Moras for possible coronary artery bypass  grafting. It was recommended that alternatives could include medical  therapy or proceeding with surgery. Of consideration was tobacco  smoking. She apparently quit and then was seen back by Dr. Cornelius Moras and  wanted to go on with having surgery. She apparently had daily angina  multiple times during the day. She did use nitroglycerin, either 1 or 2  form. Despite being on multiple anti-anginals, she __________ because of  financial reasons.   PAST MEDICAL HISTORY:  Includes history of peripheral neuropathy,  history of CVA, IDDM, cholecystectomy, C-section, removal of all top  teeth with now dentures on the top, hypertension, hyperlipemia, GERD,  peripheral vascular disease including a history of right iliac stent.  She has 80% left iliac stenosis. She has carotid disease 0 to 49% on the  right and 70 to 99% on the left. She also has mild renal artery  stenosis. ASCVD with a history of a Perma stent, placed to her  circumflex in October of 2008. She has residual RCA disease, which is  dominant. That is scattered, not amenable to PCI. She also has LAD  disease at the apex, not amenable to PCI or surgery. She has chronic  daily angina. Chronic back problems and severe peripheral neuropathy.   MEDICATIONS:  NovoLog 70/30 on a sliding scale insulin basis.  1. Lisinopril 40 mg daily.  2. Aciphex 20 mg daily.  3. Plavix 75 mg daily.  4. Aspirin 81 mg daily.  5. Imdur 120 mg daily.  6. Amlodipine 5 mg daily.  7. Crestor 5 mg daily.  8. Nitroglycerin p.r.n.  9. Lopressor 75 mg b.i.d.  10.Ranexa 500 mg b.i.d.  11.Requip 2 mg at bedtime.  12.Neurontin 600 mg at bedtime.  13.Ultram 50 mg p.r.n.  14.She also tells me that she takes Percocet 10/325 every 4 hours      p.r.n. for her chronic peripheral vascular disease. She also has      chronic back pain.   ALLERGIES:  AUGMENTIN, INTRAVENOUS CONTRAST (cause her nausea and  vomiting.)   FAMILY HISTORY:  Her mother died of coronary disease and diabetes.  SOCIAL HISTORY:  She is married. She lives with her husband. She quit  smoking. She has 2 grandchildren. She has 1 child, age 35 and one child,  age 56.   REVIEW OF SYSTEMS:  Negative for any fever, cough, cold. She has had  some diarrhea. She thinks this is associated with her anxiety or from  having surgery. Also positive for some nausea and reflux. Daily chest  pain. No palpitations, no syncope, no pre-syncope. No unilateral  weakness. She states that she has visual disturbances related to her  diabetes but not any worse. No black tarry stools. She does get some  ankle swelling at times. Other systems are negative.   PHYSICAL EXAMINATION:  VITAL SIGNS:  Blood pressure 118/74, pulse 77,  respiratory rate 14, temperature 97.5.  GENERAL:  No acute distress.  HEENT:  Normocephalic. Pupils are equal, round. Sclerae are clear.  NECK:  Without any thyromegaly. Positive bilateral carotid bruits. No   thyromegaly, no adenopathy.  NECK:  Supple.  LUNGS:  Clear to auscultation bilaterally. Good inspiratory effort.  CARDIAC:  Breath sounds regular. S1 and S2 are present. No murmur, rub,  or gallop noted.  ABDOMEN:  Bowel sounds present x4. No masses, no tenderness. No  hepatomegaly.  NEUROLOGIC:  Alert and oriented times three. No focal deficits.  EXTREMITIES:  Lower extremity strength is equal bilaterally. She has no  lower extremity edema at this time. She has good dorsalis pedis pulses.  SKIN:  Warm and dry.   ASSESSMENT:  1. Chronic daily angina. Unable to control with medications.  2. Coronary artery disease as described above. Needs bypass graft to      her RCA for her angina.  3. AF TVD with carotid disease bilaterally. Iliac disease with patent      stent on the right and 80% stenosis on the left.  4. EF of 55%.  5. IDDM.  6. Peripheral neuropathy.  7. History of CVA.  8. Hypertension.  9. Hyperlipidemia.  10.GERD.   PLAN:  She is admitted for IV Integrilin. She starts her Plavix on  Saturday. She is to have surgery on this coming Friday by Dr. Cornelius Moras. She  will be maintained on her current medications.      Lezlie Octave, N.P.      Nanetta Batty, M.D.  Electronically Signed    BB/MEDQ  D:  09/30/2007  T:  09/30/2007  Job:  [id number]   cc:   Merlene Laughter. Renae Gloss, M.D.

## 2011-01-25 NOTE — Op Note (Signed)
[name], [name]               ACCOUNT NO.:  [id number]   MEDICAL RECORD NO.:  [id number]          PATIENT TYPE:  AMB   LOCATION:  SDS                          FACILITY:  [facility]   PHYSICIAN:  Thomas A. Cornett, M.D.DATE OF BIRTH:  Jan 12, 1961   DATE OF PROCEDURE:  01/13/2009  DATE OF DISCHARGE:  01/13/2009                               OPERATIVE REPORT   PREOPERATIVE DIAGNOSIS:  Chronic left breast nipple inflammation and  mastitis.   POSTOPERATIVE DIAGNOSIS:  Chronic left breast nipple inflammation and  mastitis.   PROCEDURE:  Excision of left nipple and subcutaneous ducts.   SURGEON:  Maisie Fus A. Cornett, MD   ANESTHESIA:  LMA with 0.25% Sensorcaine local.   ESTIMATED BLOOD LOSS:  10 mL.   SPECIMEN:  Left nipple with central duct system to pathology.   INDICATIONS FOR PROCEDURE:  The patient is a 50 year old female who  about 9 months ago underwent a left central duct excision due to chronic  draining ducts and papilloma.  Unfortunately, this became infected and  remained infected.  She has had numerous incision and drainage of the  nipple with actual drain placement back in December.  She has had  numerous runs of antibiotics but unfortunately we could not totally  clear up the infection.  I felt that given the length of time that this  was going on and the fact that she probably has some chronic low-grade  infection and chronic inflammation that this would not resolve without  excision of this tissue back to good healthy breast tissue.  She has a  history of diabetes mellitus and has significant peripheral vascular  disease and coronary artery disease.  Given these risk factors, the  likelihood of this healing without excision of this tissue was low and  she understood that and wished to proceed due to chronic pain issues.   DESCRIPTION OF PROCEDURE:  The patient was brought to the operating room  and placed supine.  After induction of LMA anesthesia, the left nipple  and breast were prepped and draped in a sterile fashion.  This was  marked preoperatively.  Curvilinear incisions above and below the nipple  were made.  The tissue below and the duct system was quite hard and  there was significant inflammation noted with it.  There was a mild  amount of purulent drainage.  The entire nipple complex including the  nipple and areolar skin were excised.  This was very hard and was sent  to pathology for further evaluation.  The wound showed good healthy  breast tissue throughout.  This was irrigated.  A deep layer of 3-0  Vicryl was placed and a 3-0 nylon stitch was placed in a vertical  mattress with  orientation to approximate the skin.  Quarter inch iodoform packing was  used and packed in between each stitch.  Dry dressings were applied.  All final counts of sponge, needle, and instruments were found to be  correct at this portion of the case.  The patient was awoke and taken to  the recovery room in satisfactory condition.  Thomas A. Cornett, M.D.  Electronically Signed     TAC/MEDQ  D:  01/13/2009  T:  01/14/2009  Job:  [id number]

## 2011-01-25 NOTE — Procedures (Signed)
   [name], [name]               ACCOUNT NO.:  [id number]   MEDICAL RECORD NO.:  [id number]          PATIENT TYPE:  INP   LOCATION:  6532                         FACILITY:  [facility]   PHYSICIAN:  Nanetta Batty, M.D.   DATE OF BIRTH:  [birth year]   DATE OF PROCEDURE:  DATE OF DISCHARGE:                    PERIPHERAL VASCULAR INVASIVE PROCEDURE   Cerebral angiogram.   [name] is a 50 year old female with a history of CAD and PAD.  She  has had bilateral iliac artery stents, stenting of her circumflex and  SVG to PDA, as well as left internal carotid artery stenting in November  of last year under the CHOICE Protocol for high-grade ICA stenosis.  She was admitted with TIA, with right-sided weakness and slurred speech.  She presents now for angiography to reevaluate her carotid stent.   The patient was brought to the second floor [facility]  [facility] in a postabsorptive state.  She had a diagnostic coronary  arteriogram prior to this procedure with an indwelling 5-French sheath.  Visipaque dye was used for the entirety of the case.  Retrograde aortic  pressure was monitored throughout the case.  A 5-French JB-1 catheter  was used.   ANGIOGRAPHIC RESULTS:  1. Right carotid:      a.     The right internal carotid with an approximately 50%       stenosis of fluoroscopic calcium.      b.     The right external carotid was small with high-grade       stenosis at its origin.      c.     The intracranial portion will be read by interventional       neuro radiology.  2. Left internal carotid:      a.     The stent was widely patent with smooth endothelial covering       of the stent struts and at most, 30% in-stent restenosis.  3. The external carotid was absent.  4. The intracranial portion will be read by neuro interventional      radiology.   IMPRESSION:  [name] has a widely patent left internal carotid artery  stent with what appears to be unremarkable intracranial  anatomy.  There  is no obvious cause for her transient ischemic attack symptoms.   Continued medical therapy would be recommended.   The patient left the lab in stable condition.      Nanetta Batty, M.D.     [name]  D:  01/14/2010  T:  01/14/2010  Job:  [id number]   cc:   [facility]  [facility]  [facility]   Electronically Signed by Nanetta Batty M.D. on 01/26/2010 03:27:18 PM

## 2011-01-25 NOTE — Discharge Summary (Signed)
[name], GASTER               ACCOUNT NO.:  [id number]   MEDICAL RECORD NO.:  [id number]          PATIENT TYPE:  OBV   LOCATION:  6524                         FACILITY:  [facility]   PHYSICIAN:  Nanetta Batty, M.D.   DATE OF BIRTH:  1960-11-02   DATE OF ADMISSION:  07/01/2008  DATE OF DISCHARGE:  07/02/2008                               DISCHARGE SUMMARY   DISCHARGE DIAGNOSES:  1. Peripheral vascular disease, status post left common iliac artery      stenting this admission.  2. Prior right common iliac artery stent, patent this admission.  3. Coronary artery disease, coronary artery bypass grafting x1 with a      vein graft to the posterior descending coronary artery in January      2009, for intractable angina.  4. Insulin-dependent diabetes, hemoglobin A1c is 9.  5. Treated hypertension.  6. Treated dyslipidemia.  7. Ex-smoker, quit in January 2009.   HOSPITAL COURSE:  Ms. Staff is a 50 year old female known to Dr. Allyson Sabal  with a history of peripheral vascular disease and coronary artery  disease.  She has had lots of problems with chest pain and has had  multiple casts in intervention.  Ultimately, she underwent single-vessel  bypass on October 05, 2007, with a vein graft to her PDA.  She  fortunately has done much better since then.  She has only occasional  chest pain.  She saw Dr. Allyson Sabal in the office on June 18, 2008, and has  had some lifestyle-limiting claudication.  She has had a previous right  common iliac artery stent and has known high-grade left common iliac  artery disease.  She was admitted for elective PV angiogram and this was  done on July 01, 2008, by Dr. Allyson Sabal.  She had a 90% ostial left  common iliac artery which he stented with good result.  We feel she can  be discharged on July 02, 2008.   DISCHARGE MEDICATIONS:  1. Aspirin 81 mg a day.  2. Metoprolol 50 mg t.i.d.  3. Lisinopril 20 mg a day.  4. Crestor 20 mg a day.  5. Plavix 75 mg a day.  6. Zetia 10 mg a day.  7. NovoLog 70/30, 37.5 units twice a day.  8. Nitroglycerin sublingual p.r.n.   LABORATORY DATA:  White count 9.1, hemoglobin 12.4, hematocrit 36.2,  platelets 293.  Sodium 140, potassium 3.5, BUN 11, creatinine 0.8.   DISPOSITION:  The patient is discharged in stable condition and will  follow up with Dr. Allyson Sabal.  She will need outpatient Dopplers.      Abelino Derrick, P.A.      Nanetta Batty, M.D.  Electronically Signed    LKK/MEDQ  D:  07/02/2008  T:  07/02/2008  Job:  [id number]   cc:   [facility]

## 2011-01-25 NOTE — Discharge Summary (Signed)
NAMEELONA, YINGER               ACCOUNT NO.:  [id number]   MEDICAL RECORD NO.:  [id number]          PATIENT TYPE:  INP   LOCATION:  2920                         FACILITY:  [facility]   PHYSICIAN:  Nanetta Batty, M.D.   DATE OF BIRTH:  1961/05/21   DATE OF ADMISSION:  03/23/2009  DATE OF DISCHARGE:  03/25/2009                               DISCHARGE SUMMARY   DISCHARGE DIAGNOSES:  1. Chest pain, worrisome for unstable angina.  2. Known coronary artery disease with coronary artery bypass grafting      in January 2009 with an saphenous vein graft to posterior      descending artery.  3. Recurrent chest pain with percutaneous coronary intervention using      an Endeavor stent to the saphenous vein graft to posterior      descending artery in March 2010 by Dr. Jacinto Halim, followup      catheterization for chest pain on Jan 21, 2009, revealed a patent      site with a 90% distal posterior descending artery stenosis after      the vein graft, plan is for medical therapy.  4. Type 1 insulin-dependent diabetes.  5. Peripheral vascular disease with previous bilateral iliac artery      stenting.  6. Lower extremity peripheral neuropathy, the patient is seeing a      neurologist for this and currently is being evaluated, previously      this was felt to be secondary to statins, but this may not be the      case.  7. Treated hypertension.  8. Recent mastitis, followed by Dr. Luisa Hart with a recent left breast      surgery.   HOSPITAL COURSE:  The patient is a 50 year old female known to Dr.  Allyson Sabal.  She has had a long history of chest pain, and has had multiple  catheterizations and interventions.  She ultimately had bypass surgery  x1 with an SVG to PDA in January 2009.  After that she had recurrent  chest pain and had an Endeavor stent placed to the vein graft by Dr.  Jacinto Halim on November 20, 2008.  She was last studied in May of this year and  had patent stent with 90% distal stenosis after the brain  graft  insertion to be treated medically.  She has had problems with mastitis  and recently had undergone left breast surgery by Dr. Luisa Hart.  She  apparently has some recurrent problems with her right breast as well.  In any event, she showed up in the emergency room on March 23, 2009, with  substernal chest pain, worrisome for angina.  She was admitted to  telemetry, started on heparin and nitrates.  She ruled out for an MI.  She has trouble tolerating nitrates because of headaches.  We stopped  her heparin and ambulated her and she is feeling better.  We feel she  can be discharged home on March 25, 2009.  We have resumed a low-dose  Crestor to see if she can tolerate this.  As noted above, her lower  extremity pain is probably from  neuropathy and not from statins.  She  has no worsening of her lower extremity pain.  We can probably titrate  her statin as an outpatient.  We offered her nitroglycerin patch, her  Imdur, but she says she would rather take a nitroglycerin p.r.n. because  of the headache.  She asked for refill of her OxyContin pain medicine  until she can see Dr. Luisa Hart on Monday after discharge.  Dr. Julien Nordmann has suggested we may try changing her metoprolol to Bystolic  because of the vasodilatory effects.  This will be deferred to Dr. Allyson Sabal  when he sees her as an outpatient.   DISCHARGE MEDICATIONS:  1. Aspirin 81 mg a day.  2. Metoprolol 100 mg b.i.d.  3. Lisinopril 20 mg a day.  4. Plavix 75 mg a day.  5. HCTZ 12.5 mg a day.  6. Potassium 10 mEq a day.  7. Ranexa 1 g b.i.d.  8. Verapamil 120 mg twice a day.  9. Nitroglycerin sublingual p.r.n.  10.Lexapro 10 mg a day.  11.Humulin 70/30 75 units in the morning and 30 units in the evening.  12.Oxycodone 5 mg 1-2 q.4-6 h. p.r.n.  13.Crestor 5 mg a day.   LABORATORIES:  Sodium 139, potassium 3.7, BUN 22, and creatinine 0.9.  CK-MB and troponins were negative.  Hematology shows a white count 9.8,  hemoglobin  12.5, hematocrit 37.0, and platelets 345.  Cholesterol is 297  with triglycerides of 441, HDL of 44, LDL uncalculable because of her  dyslipidemia.  TSH is 0.95.  Chest x-ray shows no active cardiopulmonary  disease.  She has had previous lumbar spine films, which show lumbar  spondylosis.   DISPOSITION:  The patient is discharged in stable condition and will  follow up with Dr. Allyson Sabal.  She will keep her appointment with Dr.  Luisa Hart and complete her neurologic workup, which is currently in  process for her lower extremity neuropathy.  We can consider titrating  her statin as an outpatient and possibly adding fenofibrate.      Abelino Derrick, P.A.      Nanetta Batty, M.D.  Electronically Signed    LKK/MEDQ  D:  03/25/2009  T:  03/25/2009  Job:  [id number]   cc:   [facility]  Maisie Fus A. Cornett, M.D.

## 2011-01-25 NOTE — Op Note (Signed)
[name], [name]               ACCOUNT NO.:  [id number]   MEDICAL RECORD NO.:  [id number]          PATIENT TYPE:  INP   LOCATION:  2302                         FACILITY:  [facility]   PHYSICIAN:  Salvatore Decent. Cornelius Moras, M.D. DATE OF BIRTH:  11-Oct-1960   DATE OF PROCEDURE:  10/05/2007  DATE OF DISCHARGE:                               OPERATIVE REPORT   PREOPERATIVE DIAGNOSES:  Multivessel coronary artery disease with  medically refractory angina pectoris.   POSTOPERATIVE DIAGNOSES:  Multivessel coronary artery disease with  medically refractory angina pectoris.   PROCEDURE:  Median sternotomy for off-pump coronary artery bypass  grafting times one using saphenous vein graft to the posterior  descending coronary artery with endoscopic saphenous vein harvest from  the right thigh.   SURGEON:  Dr. Salvatore Decent. Cornelius Moras   ASSISTANT:  Stephanie Acre. Dominick, PA.   ANESTHESIA:  General.   BRIEF CLINICAL NOTE:  The patient is a 50 year old obese female with  history of coronary artery disease, hypertension, hyperlipidemia, type 2  diabetes mellitus, cerebrovascular disease, peripheral vascular disease  and longstanding tobacco abuse.  The patient presented several months  previously with symptoms consistent with angina pectoris.  Cardiac  catheterization in October, 2008 was notable for two-vessel coronary  artery disease.  She underwent percutaneous coronary intervention and  stenting of the left circumflex coronary artery.  The patient continued  to have medically refractory symptoms of angina and follow-up  catheterization revealed excellent result in the stenting in the left  circumflex territory.  There is not any significant disease in the left  anterior descending coronary artery territory.  There remains diffuse  disease in the right coronary artery, and coronary anatomy in the right  coronary artery is felt to be extremely unfavorable for possible  percutaneous coronary intervention.  The  patient was managed medically  initially, but continued to have refractory symptoms of angina.  Despite  maximal medical therapy, ultimately prompting surgical referral.  A full  consultation note has been dictated previously.  The patient had  subsequently quit smoking and has made considerable strides and improved  control of diabetes mellitus.  She now presents for single-vessel  coronary artery bypass grafting for treatment of refractory symptoms of  angina.  The patient and her family understand and accept all associated  risks of surgery including, but not limited to risk of death, stroke,  myocardial infarction, congestive heart failure, respiratory failure,  pneumonia, bleeding requiring blood transfusion, arrhythmia, heart block  or bradycardia requiring permanent pacemaker, and most importantly the  significant likelihood of progressive coronary artery disease in the  future.  All their questions have been addressed.   OPERATIVE NOTE IN DETAIL:  The was patient brought to the operating room  on the above-mentioned date and central monitoring was established by  the anesthesia service under the care and direction of Dr. Sharee Holster.  Specifically, a Swan-Ganz catheter was placed through the  right internal jugular approach.  A radial arterial line was placed.  Intravenous antibiotics were administered.  Following induction with  general endotracheal anesthesia, a Foley catheter was placed.  The  patient's chest, abdomen, both groins, and both lower extremities were  prepared and draped in sterile manner.  Of note, the patient's  preoperative urinalysis was noted on the morning of surgery to be  abnormal, and at the time of Foley catheter placement a specimen was  obtained and sent for routine culture and sensitivity.   Baseline transesophageal echocardiogram was performed by Dr. Jacklynn Bue.  This demonstrates normal left ventricular size and function.   A portion of greater  saphenous vein is removed from the patient's right  thigh using endoscopic vein harvest technique through a small incision  made just above the right knee.  The saphenous vein is good-quality  conduit.  After the saphenous vein has been removed, the small incision  in the right thigh was closed in multiple layers with running absorbable  suture.   A median sternotomy incision was performed.  The pericardium was opened.  The patient is heparinized systemically.  The right pleural space was  opened widely to allow mobilization of the heart into the right chest.  The Guidant Acrobat cardiac stabilization system is utilized to  facilitate off-pump coronary artery bypass grafting.  The apical suction  cup and horseshoe stabilizing bar are both utilized.  Proximal vessel  control was obtained with elastic vessel loop.  Distal control was not  necessary.  Intracoronary shunts are not utilized.  The patient was  placed in steep Trendelenburg position with the table rotated towards  the surgeon's side to facilitate herniation of the heart towards the  right chest and maintain adequate filling throughout.  The patient  tolerated these maneuvers quite well, maintaining stable hemodynamics  throughout.   Saphenous vein graft is constructed in end-to-side fashion to the  posterior descending coronary artery.  At the site of distal bypass the  posterior descending coronary artery measures 1.4 mm in diameter and is  a fair to good quality target vessel for grafting.  A 1 probe will pass  distally and 1.5 probe will pass retrograde a short distance.  The  proximal end of the saphenous vein graft is constructed directly to the  ascending aorta without use of the any clamp on the aorta through use of  the heart string proximal anastomoses device for maintaining hemostasis.  After completion of the bypass graft, protamine is administered to  reverse the anticoagulation.  Mediastinum was irrigated with  saline  solution containing vancomycin.  Meticulous surgical hemostasis is  ascertained.  The mediastinum and the right pleural space are drained  using three chest tubes exited through separate stab incisions  inferiorly.   The pericardium was reconstructed using the CorMatrix pericardial patch.  This patch is sewn circumferentially on three sides to the pericardial  free edge using 4-0 Prolene suture.  The chest is closed with double-  strength sternal wire.  The soft tissues anterior to the sternum are  closed in multiple layers and the skin is closed with a running  subcuticular skin closure.   The patient tolerated the procedure well and was transported to the  surgical intensive care unit in stable condition.  There are no  intraoperative complications.  All sponge, instrument and needle counts  are verified correct at completion of the operation.  No blood products  were administered.      Salvatore Decent. Cornelius Moras, M.D.  Electronically Signed     CHO/MEDQ  D:  10/05/2007  T:  10/05/2007  Job:  [id number]   cc:   Nanetta Batty, M.D.  Lupita Raider, M.D.

## 2011-01-25 NOTE — Cardiovascular Report (Signed)
NAMEZANETTA, DEHAAN NO.:  [id number]   MEDICAL RECORD NO.:  [id number]          PATIENT TYPE:  INP   LOCATION:  2038                         FACILITY:  [facility]   PHYSICIAN:  Nicki Guadalajara, M.D.     DATE OF BIRTH:  1960-10-14   DATE OF PROCEDURE:  DATE OF DISCHARGE:                            CARDIAC CATHETERIZATION   INDICATIONS:  [name] is a 50 year old female patient of Dr.  Allyson Sabal who has a history of known coronary artery disease. Apparently she  is status post remote circumflex as well as right common iliac artery  stenting.  On June 27, 2007 because of recurrent chest pain, she  underwent catheterization and had insertion of a new Promus stent placed  to a new lesion in her circumflex just beyond the previously placed  stent.  She also had diffuse scattered RCA stenoses of 80% in a very  tortuous vessel as well as 80% left iliac stenosis.  The patient  apparently had seen Dr. Allyson Sabal in follow-up and was scheduled to have an  elective intervention to her left iliac system on July 31, 2007.  She was admitted to [facility] on July 20, 2007 with recurrent  chest pain.  She is now referred for cardiac catheterization.   DESCRIPTION OF PROCEDURE:  After premedication with Valium 5 mg  intravenously, the patient was prepped and draped in the usual fashion.  The right femoral artery was punctured anteriorly and a 5-French sheath  was inserted.  Diagnostic cardiac catheterization was done utilizing 5-  J[place]ica Judkins 4 left and right coronary catheters.  200 mcg of  intracoronary nitroglycerin was also selectively administered down the  right coronary artery.  A 5-French pigtail catheter was used for biplane  selective arteriography.  The patient tolerated the procedure well and  returned to her room in satisfactory condition.   HEMODYNAMIC DATA:  Central aortic pressure was 150/70.  Left ventricular  pressure was 150/6, post A-wave 15.   The  left main coronary artery was angiographically normal and bifurcated  into an LAD and left circumflex system.   The LAD was mildly tortuous and had mild 20% narrowing after her first  diagonal vessel. There was 90% apical LAD stenosis in a small apical  portion of the LAD system.   The circumflex vessel had 50% ostial stenosis and then had a bend in the  vessel.  Following the first marginal vessel, there was the previously  placed two prior stents in tandem fashion. These were widely patent  without evidence for In-Stent restenosis.  The distal circumflex was  free of significant disease.   The right coronary artery was markedly tortuous and had diffuse  stenoses.  There was 60% very proximal stenosis followed by 70% stenosis  in a bend in the vessel.  Following a 90 degree bend in the vessel,  there was then 80% diffuse stenoses followed by 70% stenosis before the  crux.  After the acute margin, there was diffuse 70% stenosis.  In the  proximal PDA, there was 90% focal stenosis and in the continuation  branch of  the RCA just beyond the PDA takeoff there was diffuse 70%  stenosis.   Biplane selective arteriography revealed preserved global contractility  with an ejection fraction of at least 55%.  In the LAO projection,  there was minimal focal area of low to mid posterolateral  hypocontractility.   IMPRESSION:  1. Preserved global contractility with minimal focus of low to mid      posterolateral hypocontractility.  2. Significant native coronary artery disease with 20% LAD stenosis      after the first diagonal vessel and 90% apical LAD stenosis; 50%      ostial circumflex stenosis with widely patent previously placed      tandem stents in the mid circumflex vessel after the OM1 vessel,      and no significant change in the diffusely diseased RCA vessel with      60% proximal followed by 70% proximal stenosis, diffuse 80% mid      stenoses after a very sharp angle bend in the  vessel followed by      70% diffuse stenosis, 70% stenosis diffusely after the acute margin      with 90% PDA stenosis and 70% stenosis in the continuation branch      of the RCA after the PDA takeoff.   RECOMMENDATIONS:  Cineangiograms will be reviewed with Dr. Allyson Sabal.  In  the interim, the patient will be titrated on maximal medical therapy  with the addition of Renexa, increased nitrates and also consideration  for possible EECP therapy due to the diffuseness of her RCA disease.  Final recommendation will be forthcoming upon review with Dr. Allyson Sabal.           ______________________________  Nicki Guadalajara, M.D.     TK/MEDQ  D:  07/24/2007  T:  07/24/2007  Job:  [id number]   cc:   Nanetta Batty, M.D.

## 2011-01-27 ENCOUNTER — Ambulatory Visit (INDEPENDENT_AMBULATORY_CARE_PROVIDER_SITE_OTHER): Payer: Medicaid Other | Admitting: Family Medicine

## 2011-01-27 ENCOUNTER — Encounter: Payer: Self-pay | Admitting: Family Medicine

## 2011-01-27 VITALS — BP 131/86 | HR 128 | Temp 98.5°F | Ht 65.0 in | Wt 161.0 lb

## 2011-01-27 DIAGNOSIS — I2510 Atherosclerotic heart disease of native coronary artery without angina pectoris: Secondary | ICD-10-CM

## 2011-01-27 DIAGNOSIS — E1159 Type 2 diabetes mellitus with other circulatory complications: Secondary | ICD-10-CM

## 2011-01-27 DIAGNOSIS — I10 Essential (primary) hypertension: Secondary | ICD-10-CM

## 2011-01-27 DIAGNOSIS — R300 Dysuria: Secondary | ICD-10-CM

## 2011-01-27 DIAGNOSIS — F329 Major depressive disorder, single episode, unspecified: Secondary | ICD-10-CM

## 2011-01-27 DIAGNOSIS — E119 Type 2 diabetes mellitus without complications: Secondary | ICD-10-CM | POA: Insufficient documentation

## 2011-01-27 LAB — POCT UA - MICROSCOPIC ONLY

## 2011-01-27 LAB — POCT URINALYSIS DIPSTICK
Ketones, UA: NEGATIVE
Protein, UA: >=300
Spec Grav, UA: >=1.03
Urobilinogen, UA: 0.2

## 2011-01-27 MED ORDER — INSULIN GLARGINE 100 UNIT/ML ~~LOC~~ SOLN: 69.0000 [IU] | Freq: Two times a day (BID) | SUBCUTANEOUS | Status: DC

## 2011-01-27 MED ORDER — VENLAFAXINE HCL ER 37.5 MG PO CP24: 75.0000 mg | ORAL_CAPSULE | Freq: Every day | ORAL | Status: DC

## 2011-01-27 NOTE — Patient Instructions (Signed)
Please increase your lantus to 70 units in AM and then 67 at night Start the effexor I think it will help I am proud of you. Make sure you drink plenty of fluids, mostly water See Dr. Allyson Sabal on Tuesday I want to see you again in 1 month.

## 2011-01-27 NOTE — Assessment & Plan Note (Addendum)
Pt is seeing Dr. Allyson Sabal here next week.  Pt does seem more motivate to be proactive with her healthcare. Pt is now taking her medications more regularly then usual which should be helpful.

## 2011-01-27 NOTE — Assessment & Plan Note (Signed)
Pt has had some better control still in the 300's here, so will increase lantus to 70 units in AM and 68 units in PM.  Pt also has novolog and is learning the sliding scale. Pt will come back in 1 month to talk about sugars and weight control, pt is much more motivated than usual.

## 2011-01-27 NOTE — Assessment & Plan Note (Signed)
HAs improved does like the ativan to help with the edge.  Pt is going to start the Effexor. Will see again in  Month.  Pt mood was improved.

## 2011-01-27 NOTE — Progress Notes (Signed)
  Subjective:    Patient ID: [name], female    DOB: 03/25/1961, 50 y.o.   MRN: [id number]  HPI  1. Hypertension Blood pressure at home:does not check Blood pressure today: 131/81 Taking Meds: yes Side effects:no ROS: Denies headache visual changes, vomiting, chest pain or abdominal pain or shortness of breath.  2. Diabetes:  High at home: 279 Low at home: 54 felt shaky happen after she missed lunch Taking medications: no Side effects:none except polyuria and and polydipsia ROS: denies fever, chills, dizziness, loss of conscieness, numbness or tingling in extremities or chest pain. Pt had started taking her insulin again and itdid seem to help.  Does feel funny though when less than 120.   3.  CAD-  Pt still has not seen by Dr. Allyson Sabal but is scheduled tuesday denies chest pain except when eating. Pt is taking her plavix.   4.  Pain clinic for pain meds. Using less pt states  5.  Pt states she is having pain with urination still had a yeast infection before the pills did help, pt denies any blood, is having a little more urgency, maybe a little back pain but no fever or chills at this time.  No vaginal discharge  6. Pt is finding it hard for her to do any activity she used to like, not sleeping well, is losing weight and is fighting with her husband more.  Pt does have hx of being depressed in the past.  Pt denies suicide or homicidal ideation. Pt was given ativan which helped last time and did not start the Effexor but is willing to try.  Pt is hopeful about the future.  Still does not want to talk to anyone.       Review of Systems Denies fever, chills, nausea vomiting abdominal pain, dysuria, chest pain, shortness of breath dyspnea on exertion or numbness in extremities      Objective:   Physical Exam  Generally: NAD mildly dishelved, tearful CV: tachy RR 1/6 SEM radiating to carotids.  Pul: CTAB Abd:  BS +, mild stool burden palpated in RLQ, no masses, TTP  subrpubic, no CVA tenderness. Afebrile Ext:  no edema at ankles.        Assessment & Plan:

## 2011-01-27 NOTE — Assessment & Plan Note (Signed)
Improved, continue current regimen

## 2011-01-28 NOTE — Op Note (Signed)
[facility]  Patient:    [name], [name]                      MRN: [id number] Proc. Date: 12/11/99 Adm. Date:  [id number] Attending:  Tammi Sou                           Operative Report  PREOPERATIVE DIAGNOSES:       1. A [redacted] week gestation.                               2. Arrest of second stage of labor (arrest of descent).                               3. Severe preeclampsia.                               4. Insulin-dependent diabetes mellitus.                               5. Desires sterilization.  POSTOPERATIVE DIAGNOSES:      1. A [redacted] week gestation.                               2. Arrest of second stage of labor (arrest of descent).                               3. Severe preeclampsia.                               4. Insulin-dependent diabetes mellitus.                               5. Desires sterilization.  PROCEDURE:                    Primary low transverse cesarean section and bilateral partial salpingectomy (Pomeroy technique).  SURGEON:                      Charles A. Clearance Coots, M.D.  ASSISTANT:                    Montey Hora, M.D.  ANESTHESIA:                   Epidural.  ESTIMATED BLOOD LOSS:         900 cc.  FLUIDS:                       1800 cc.  URINE OUTPUT:                 350 cc concentrated.  COMPLICATIONS:                None.  FOLEY:                        To gravity.  FINDINGS:  Viable female at 0218, Apgars of 2 at one minute, 8 at five minutes, weight of 5 pounds and 10 ounces.  Cord pH of 7.15.  Normal uterus, ovaries, and fallopian tubes.  OPERATION:                    Patient was brought to the operating room and after satisfactory redosing of the epidural a Pfannenstiel incision was made with the  scalpel.  It was deepened down to the fascia with the scalpel.  The fascia was nicked in the midline and the fascial incision was extended to the left and to he right  with curved Mayo scissors.  The superior and inferior fascial edges were taken off the rectus muscle in both blunt and sharp dissection.  The rectus muscle was bluntly and sharply divided in the midline superiorly and inferiorly, being  careful to avoid the urinary bladder.  The peritoneum was entered digitally and was digitally extended to the left and to the right.  The bladder blade was positioned and the vesicouterine fold, the peritoneum above the reflection of the urinary bladder was grasped with forceps and was incised ______ Metzenbaum scissors. The incision was extended to the left and to the right with Metzenbaum scissors. The bladder flap was bluntly developed and the bladder blade was repositioned in front of the urinary bladder, placed well out of the operative field.  The uterus was  entered in the lower uterine segment transversely with the scalpel.  Clear amniotic fluid was expelled.  The uterine incision was extended to the left and to the right with bandage scissors.  The vertex was noted to be in the left occiput posterior position and was delivered with much difficulty with the aid of fundal pressure  from the assistant.  Assistance was also needed from below to repel the vertex anteriorly to break the suction.  The infants mouth and nose were suctioned with the suction bulb and the delivery was then completed with aid of fundal pressure from the assistant.  Umbilical cord was clamped and cut and the infant was handed off to the nursery staff.  Cord pH and cord blood were obtained and the placenta was spontaneously expelled from the uterine cavity intact.  The uterus was exteriorized and the endometrial surface was thoroughly debrided with a dry lap  sponge.  The edges of the uterine incision were grasped with ring forceps and the uterus was closed with a continuous interlocking suture of 0 Monocryl from each  corner to the center.  Hemostasis was  excellent.  Attention was then turned above to the tubal ligation procedure.  The left fallopian tube was identified from the corneal end to the fimbrial end and was then grasped in the isthmic area of the  tube with the Babcock clamp.  The knuckle of tube beneath the Babcock clamp was  doubly ligated with 0 plain catgut and the section of the tube above the knot was excised with Metzenbaum scissors and submitted to pathology for evaluation. There was no active bleeding from the tubal stump.  The same procedure was performed n the opposite side without complications.  The uterus was then placed back in its normal anatomic position.  Pelvic cavity was thoroughly irrigated with warm saline solution and all clots were removed.  The abdomen was then closed as follows: he rectus muscle was approximated with a few interrupted sutures of 2-0 Monocryl. The fascia was closed with a continuous suture of 0 PDS from  each corner to the center. Subcutaneous tissue was thoroughly irrigated with warm saline solution and all areas of subcutaneous bleeding were coagulated with the Bovie.  The skin was then approximated with surgical stainless steel staples.  Sterile bandage was applied to the incision closure.  Surgical technician indicated that all needle, sponge, and instrument counts were correct.  The patient tolerated the procedure well. Transported to recovery room in satisfactory condition. DD:  12/11/99 TD:  12/11/99 Job: [id number]

## 2011-01-28 NOTE — Procedures (Signed)
EEG NUMBER:  This is a patient of Dr. Johnell Comings.   The patient has a history of headaches and staring spells and is unable  seen out of the right eye.   MEDICATIONS:  Medications listed are Norvasc, lisinopril, Altace, Actos,  hydrochlorothiazide, Imdur, Plavix and aspirin.   TECHNICAL DESCRIPTION:  This EEG was recorded in the awake state. The  background activity shows 910 Hz rhythms with higher amplitudes seen in  the posterior head regions bilaterally.  There is no evidence of any  focal asymmetry.  There was no stage II sleep present.  Photic  stimulation was performed which did not produce a drive response in the  occipital head regions.  Hyperventilation testing was not performed.  There was no focal asymmetry, stage II sleep or epileptiform activity  seen.   IMPRESSION:  This is a normal EEG during the awake state.           ______________________________  Genene Churn. Sandria Manly, M.D.     ZOX:[id number]  D:  11/14/2006 14:34:02  T:  11/14/2006 45:40:98  Job #:  [id number]

## 2011-01-28 NOTE — Discharge Summary (Signed)
[facility]  Patient:    [name], [name]                      MRN: [id number] Adm. Date:  [id number] Disc. Date: [id number] Attending:  Tammi [name] Dictator:   [name], M.D. CC:         [facility] (fax to [phone])                           Discharge Summary  DATE OF BIRTH:                [birth year]  DISCHARGE DIAGNOSES:          1. Status post low transverse cesarean section                                  secondary to severe preeclampsia.  This was done                                  at [redacted] weeks gestational age.  She had a viable                                  female, Apgars of 2 at one minute and 8 at five                                  minutes.  Cord pH was 7.15.                               2. Severe preeclampsia.                               3. Chronic hypertension.                               4. Diabetes mellitus type 2, insulin-requiring                                  during pregnancy.                               5. Anemia, status post two units of transfusion                                  during this admission.                               6. Bilateral tubal ligation.  DISCHARGE MEDICATIONS:        1. Ferrous sulfate 325 mg p.o. b.i.d. to t.i.d. with  meals.                               2. Tylenol No. 3 1-2 q.6h. p.r.n.                               3. Colace 100 mg p.o. b.i.d.                               4. Dulcolax 15 mg p.o. q.d. p.r.n.                               5. Norvasc 10 mg p.o. q.d.                               6. HCTZ 12.5 mg p.o. q.d.                               7. Actos 30 mg p.o. q.a.m.  PROCEDURES:                   1. Primary low transverse cesarean section (secondary                                  to arrest of second stage of labor/arrest of                                  descent and severe preeclampsia.            2. Bilateral tubal ligation (Pomeroy procedure).  HISTORY OF PRESENT ILLNESS:   This is a 50 year old female, G3, P1-0-1-1, who was admitted for induction of labor secondary to elevated blood pressures.  HOSPITAL COURSE:              #1 - DELIVERY:  The patient was admitted.  The cervix was ripened with Cytotec, and labor was augmented with Pitocin, and she was started on magnesium.  She was taken to the OR secondary to arrest of descent on the morning of December 11, 1999.  Viable female, Apgars of 2 at five minutes, cor (heart) pH of 7.15 via low transverse C-section.  #2 - The patient underwent elective tubal ligation at the same time of her C-section.  #3 - POSTOPERATIVE PREECLAMPSIA:  The patient was continued on magnesium, and was stopped on December 12, 1999, after she had diuresed quite well.  However, on December 13, 1999, she developed some shortness of breath and increased O2 requirements and found to have pulmonary edema by chest x-ray.  Was given Lasix x 1 and then transfused two units of packed red cells, which significantly improved her pulmonary edema.  She had no further oxygen requirement, and pH laboratories were followed.  She never had an elevation of her liver enzymes, although her uric acid was 9.7 on December 13, 1999, 9.7 on December 14, 1999, and then dropped to 8.5 on December 15, 1999.  Other laboratories were within normal limits.  #4 - CHRONIC HYPERTENSION:  The patient had elevated blood pressures throughout  the admission, ranging in the 170/90-100 range.  Started on Norvasc 5 mg p.o. q.d., and then sent home on Norvasc 10 mg p.o. q.d. and HCTZ 12.5 mg p.o. q.d.  This will  need to be followed up as an outpatient.  #5 - DIABETES MELLITUS TYPE 2:  The patient had a large insulin requirement prior to delivery, including 120 units of Humalog in the morning, 80 units of Humalog in the evening.  After delivery, the patient was started on Actos 30 mg p.o.  q.d. nd no longer required any insulin, and CBGs were running in the low 100 range, from 101-108.  This will also need to be followed as an outpatient.  FOLLOW-UP:                    The patient is to have home health follow up with her tomorrow, and q.d. or q.o.d. after that to monitor her blood pressure as well as her blood sugars.  The patient has a follow-up appointment on December 20, 1999, with Dr. Cliffton [name] at the office of her primary care physician (Dr. Merri [name]).  DISCHARGE INSTRUCTIONS:       The patient was instructed that if she develops severe headache, shortness of breath, right upper quadrant pain, nausea, vomiting, or has low blood sugars (she is checking her sugars with a glucometer at home), she is to return. DD:  12/15/99 TD:  12/15/99 Job: [id number]

## 2011-01-28 NOTE — Discharge Summary (Signed)
[facility]  Patient:    [name], [name]                      MRN: [id number] Adm. Date:  [id number] Disc. Date: [id number] Attending:  Tobey Bride Dictator:   Nolon Nations, M.D.                           Discharge Summary  DATE OF BIRTH:                03-30-1961  SERVICE:                      OB teaching service.  ATTENDING:                    Bing Neighbors. Clearance Coots, M.D.  RESIDENT:                     Nolon Nations, M.D.  ADMISSION DIAGNOSES:          1. Insulin-dependent diabetes mellitus.                               2. Intrauterine growth restriction 37 and three                                  weeks gestation pregnancy.                               3. Hypertension.  DISCHARGE DIAGNOSES:          1. Insulin-dependent diabetes mellitus.                               2. Intrauterine growth restriction 37 and three                                  weeks gestation pregnancy.                               3. Hypertension.  HISTORY:                      Ms. Fosco is a 50 year old G3 P1-0-0-1 at 75 and [age] who was seen at high-risk for increased blood pressure and told to come to the [facility] for direct admission.  She was admitted for induction of labor secondary to IUGR and probable preeclampsia.  HOSPITAL COURSE:              Patient was admitted with [facility] labs and an ultrasound was ordered for AFI and growth.  The patient had elevated blood pressures of 150s-160s/70s-80s.  Her labs revealed an elevated uric acid of 6.8, but otherwise normal LFTs.  Her ultrasound on March 21 revealed an AFI of 14.6.  She was started on humalog for diabetic control.  Induction of labor was considered, secondary to IUGR and hypertension; however, an amniocentesis was planned to assess fetal lung maturity.  Once taken to amniocentesis, there was no accessible pocket noted on ultrasound.  Patient was discharged home as she was stable, with  follow-up in the high-risk clinic.  DISCHARGE CONDITION:          Stable.  DISPOSITION:                  Discharge to home.  DISCHARGE MEDICATIONS:        1. Continue insulin as before.                               2. Prenatal vitamins.  FOLLOW-UP CARE:               1. Nonstress test, [facility], Monday, March 26 at                                  1 p.m.                               2. Induction of labor, Friday, December 10, 1998 at                                  5:30 a.m.DD:  01/26/00 TD:  01/27/00 Job: [id number]

## 2011-01-28 NOTE — H&P (Signed)
[name], [name]               ACCOUNT NO.:  [id number]   MEDICAL RECORD NO.:  [id number]          PATIENT TYPE:  INP   LOCATION:  6738                         FACILITY:  [facility]   PHYSICIAN:  Dwana Curd. Para March, M.D. DATE OF BIRTH:  1961-07-01   DATE OF ADMISSION:  09/10/2006  DATE OF DISCHARGE:                              HISTORY & PHYSICAL   CHIEF COMPLAINT:  Increased CBGs and headache.   SUBJECTIVE:  The patient is a pleasant 50 year old Caucasian female with  a history of coronary artery disease, peripheral vascular disease,  hypertension, hyperlipidemia and diabetes who also has a history of  pseudotumor cerebri who presents with increased CBGs and headache  tonight.  See dictation from August 28, 2006.  The patient was  admitted from August 23, 2006 to August 28, 2006 with headache,  hyperglycemia and right visual field changes.  She had an LP during this  admission to [facility] that did not change her headache symptoms, she  had an opening pressure over 20 and she had fluid drained until her  pressure was 15, this was done because she had a previous response to  therapeutic taps for pseudotumor.  Her hyperglycemia was eventually  controlled, but her headache never totally resolved.  She was discharged  to follow up as an outpatient with ophthalmology.  Per the patient, I  had a stroke according to the eye doctor.  The patient also says He  says I will not get any vision back in my right eye.  She now has a  continued headache that is global, pulsating and has a tightness  associated to it and also with hyperglycemia x5 days.  Today, she has  also had a cough with scant yellow sputum.  She has been vomiting in the  past several days, likely secondary to her headache.  She has  significant weight loss recently, she has dropped from 186 pounds to 174  pounds.  Her blurred vision in her right eye has continued.  She says  that I have bright lights at about 2 o'clock  that fluctuate.  She has  had chills today and bilateral back pain.  She called me tonight as the  MD on call with critical highs on her home glucose meter.  She was  advised to come to the ED.   PAST MEDICAL HISTORY:  Includes:  1. Coronary artery disease status post stent in 2007.  2. Peripheral vascular disease status post stent in the right common      iliac artery in 2006.  3. Hypertension.  4. Hyperlipidemia.  5. Diabetes mellitus type 2.  6. Status post laparoscopic cholecystectomy.  7. Anxiety.  8. History of pseudotumor cerebri.  9. Gastroesophageal reflux disease.  10.Obstructive sleep apnea.  11.Status post C-section with bilateral tubal ligation followed by      eventual hysterectomy.  12.Tooth extraction in 2007.   SOCIAL HISTORY:  No alcohol, one pack per day tobacco, no illicits.   FAMILY HISTORY:  Father unknown, mother died of coronary artery disease  and diabetes mellitus along with intestinal gangrene.   MEDICATIONS:  Include aspirin, Plavix, atenolol, hydrochlorothiazide,  lisinopril, Actos, Lantus and Zocor.   ALLERGIES:  AUGMENTIN CAUSES HIVES AND SHORTNESS OF BREATH.   REVIEW OF SYSTEMS:  As above with also a history of bright red blood per  rectum with a BM recently.  She has no coffee-ground emesis.   PHYSICAL EXAMINATION:  VITALS:  Temperature 98.2, blood pressure 150/93,  SaO2 100% on room air, heart rate 130s.  GENERAL:  No apparent distress,  normocephalic, atraumatic.  Mucous membranes are dry.  Extraocular  movements are intact.  PUPIL EXAM:  She has pupils that are equally round and reactive to  light, but her funduscopic exam is limited.  NECK:  Supple with free range of motion, no bruit and no  lymphadenopathy.  CARDIOVASCULAR:  Tachy with no murmur.  PULMONARY:  Clear to auscultation bilaterally.  BACK:  Positive CVA tenderness bilaterally.  ABDOMEN:  Soft, nontender with the exception of mild tenderness to  palpation over the gastric  area, positive bowel sounds, no rebound.  She  is obese.  EXTREMITIES:  2+ Dorsalis pedis with rapid capillary refill.  NEUROLOGIC EXAM:  Decreased vision in the right eye in all fields to  confrontational testing, cranial nerves otherwise intact.  Strength and  sensation is within normal limits of the bilateral upper extremities and  also in the left leg, but strength is globally decreased in the right  leg.  The deep tendon reflexes are within normal limits x4.  Note that  the patient states she has had decreased right leg strength for around  two weeks.  Gait was not tested.   EKG shows sinus tachycardia without ST elevation, no peak T waves.   I-STAT electrolytes:  Sodium 133 which corrects to 140, potassium 4.5,  chloride 105, bicarb 22, BUN 19, creatinine 0.9, glucose 516, pH 7.46.  UA:  Positive nitrite, greater than 100 glucose, specific gravity 1.036,  many bacteria and many yeast.   PENDING LABS:  Include C-Met, A1c, TSH, cardiac enzymes, lipase and  urine culture.   Chest x-ray and head CT are all pending.   ASSESSMENT/PLAN:  Patient is a 50 year old female with the following  problems:   1. Hyperglycemia.  Will start an insulin drip and follow her CBGs and      B-Mets to track her potassium and creatinine, will also check an      A1c, will rule out an MI given her history of coronary disease with      serial enzymes and EKGs and bolus her with normal saline now and      follow her urine output.  2. Headache.  Will check a head CT, especially given the right leg      findings along with her history of hypertension, especially in the      case she needs to have an LP in the morning.  The LP would most      likely happen under fluoroscopy and interventional radiology and      this cannot happen if the patient is anticoagulated; therefore, we      will hold antiplatelet agents and discuss this with the family     practice team in the morning.  3. Hypertension.  Follow  her blood pressure and her creatinine and      potassium.  We can add on her antihypertensives if her head CT is      negative and after her fluid deficit is repleted.  4. Vomiting.  Check Hemoccults and Gastroccults  and check her      hemoglobin, will give her Compazine for symptomatic relief and      check a lipase.  She does not have an abdominal exam that is      consistent with an acute abdomen.  5. Visual changes.  There is no acute change at this time, as she had      no gross abnormalities on the head CT.  She will likely need      followup with outpatient ophthalmology.  6. DNR.  7. NPO until CBGs controlled.  8. Zocor for hyperlipidemia when taking p.o.  9. SCDs for prophylaxis of DVTs, proton pump inhibitor for GERD and GI      prophylaxis.  10.Bilateral CVA pain.  I will treat presumptively as pyelonephritis      with Cipro and culture her urine, especially given that she had      Klebsiella urinary tract infection in her most recent      hospitalization.  11.Chest x-ray given recent cough.  12.Admit to stepdown unit given her insulin drip.      Dwana Curd Para March, M.D.     GSD/MEDQ  D:  09/11/2006  T:  09/11/2006  Job:  [id number]   cc:   Dr. Clelia Croft

## 2011-01-28 NOTE — H&P (Signed)
[name], [name]                         ACCOUNT NO.:  [id number]   MEDICAL RECORD NO.:  [id number]                   PATIENT TYPE:  INP   LOCATION:  0471                                 [facility]ACILITY[facility]   PHYSICIAN:  Lesly Dukes, M.D.              DATE OF BIRTH:  07/25/1961   DATE OF ADMISSION:  10/15/2003  DATE OF DISCHARGE:  10/19/2003                                HISTORY & PHYSICAL   The patient is a 50 year old female with an approximately six month history  of abdominal discomfort. She also had recurrent pyelonephritis and was  admitted to the internal medicine service at [facility] several times for  this management. On a CT that was obtained during those admissions, she was  found to have a pelvic cul-de-sac mass measuring 6.1 x 4.3, but has not  changed significantly from October 2003. There was also noted to be a left  adnexal cyst measuring 4.9 x 5.9 x 3.9.  It has been described as complex.  There has been no evidence of thickened or solid mural nodules. The patient  had a normal colonoscopy in the past. Her pyelonephritis was finally treated  by the urologist in town and has only had one other episode since her  multiple admissions in 2004.  In regards to her abdominal mass, she has  noted increase in urinary urgency and loss of urine with laughing and  straining, and she has had constipation treated with Metamucil and  lactulose.   PAST MEDICAL HISTORY:  1. Diabetes mellitus treated with p.r.n. insulin.  2. Hypertension.  3. Chronic anxiety.  4. Pseudotumor cerebri.  5. Retinal disorder.  6. GERD.   PAST SURGICAL HISTORY:  1. Cesarean section times one with bilateral tubal ligation at the time of     Cesarean section.  2. Laparoscopic cholecystectomy.  3. Tooth extraction.   PAST GYNECOLOGIC HISTORY:  NSVD times one and Cesarean section times one. No  history of abnormal Pap smears. No other known history of fibroids or other  ovarian cyst.   PAST FAMILY HISTORY:  Multiple family members have type 2 diabetes, a  maternal cousin had breast cancer, and maternal grandmother with unknown  primary site of malignancy.   SOCIAL HISTORY:  Married with two children. She smokes one pack a day of  cigarettes. Denies drug or alcohol use.   REVIEW OF SYMPTOMS:  Had lost approximately 15 pounds since asked to in late  2004 as the patient was obese. No recent fever or chills. Frequent headaches  secondary to pseudotumor cerebri. No chest pain, shortness of breath. Does  suffer from chronic constipation as described above and has loss of urine  occasionally as described above.   PHYSICAL EXAMINATION:  VITAL SIGNS: Weight 196. Vital signs stable.  GENERAL: Well-nourished, well-developed, in no acute distress.  CHEST: Clear to auscultation bilaterally.  HEART: Regular rate and rhythm.  BACK:  No CVA  tenderness.  ABDOMEN: Obese, soft, nontender, nondistended. No ascites. No organomegaly.  EXTREMITIES: Nontender. DTRs intact.  PELVIC EXAM: Tanner V. Vagina reveals no lesions, discharge, or blood.  Cervix is grossly normal, nontender. The uterus is retroverted, nontender.  Left adnexa feels full, approximately 5 cm mass. The right adnexa is not  palpable.   ASSESSMENT/PLAN:  A 50 year old female with bilateral pelvic masses causing  chronic lower abdominal and pelvic pain. The patient most likely does not  have a malignancy, however Dr. Stanford Breed will be available on standby  in case the frozen section does return back any evidence of malignancy. Plan  for laparotomy, TAH/BSO.                                               Lesly Dukes, M.D.    [name]  D:  12/18/2003  T:  12/18/2003  Job:  [id number]

## 2011-01-28 NOTE — Consult Note (Signed)
   [name], [name]                         ACCOUNT NO.:  [id number]   MEDICAL RECORD NO.:  [id number]                   PATIENT TYPE:  INP   LOCATION:  5530                                 FACILITY:  [facility]   PHYSICIAN:  Cindra Eves, D.D.S.             DATE OF BIRTH:  September 06, 1961   DATE OF CONSULTATION:  DATE OF DISCHARGE:                                   CONSULTATION                                               RONALD KULINSKI, D.D.S.    RK/MEDQ  D:  06/04/2002  T:  06/05/2002  Job:  [id number]

## 2011-01-28 NOTE — Group Therapy Note (Signed)
NAME:  [name], [name]                         ACCOUNT NO.:  [id number]   MEDICAL RECORD NO.:  [id number]                   PATIENT TYPE:  OUT   LOCATION:  [facility]                           FACILITY:  [facility]   PHYSICIAN:  [name], MD                   DATE OF BIRTH:  1960/12/12   DATE OF SERVICE:  07/29/2003                                    CLINIC NOTE   CHIEF COMPLAINT:  This is a 50 year old para 2-0-0-2 female for follow-up of  abdominal pain and recurrent pyelonephritis.  The patient saw urologist and  was treated with Levaquin and has had good results.  The patient has a  follow-up appointment with urologist tomorrow to evaluate for a diverticulum  or other abnormality.  Today the patient still complains of abdominal pain  and irregular menses.  The abdominal pain is continuous, worse with  pressing.  It occurs at night and bothers her sleep.  She does state this  has been going on for about three to four months and she has had chronic  constipation for about nine months, has about one to two bowel movements a  week with the help of Metamucil every day.  She had a negative colonoscopy  by a gastroenterologist at [facility] this year;  the only finding was  hemorrhoids.  Her menses have been irregular for several months and she is  undergoing hot flashes and flushes.  There was an endometrial biopsy on Jan 20, 2003 that was noted to be proliferative with breakdown; no evidence of  hyperplasia or malignancy.  The patient denies any dysuria, diarrhea,  nausea, or vomiting.  The patient also complains of dyspareunia.  She has no  vaginal lubrication and has hypoarousal disorder.   PHYSICAL EXAMINATION:  VITAL SIGNS:  Pulse 108, blood pressure 180/112,  weight 201.6 pounds.  GENERAL:  Well-nourished, well-developed, no apparent distress.  ABDOMEN:  Obese, well-healed infraumbilical scar.  Abdominal pain around the  area of the umbilicus.  No rebound, no guarding, no distention.   There is no  lower quadrant pain upon palpation, no bladder pain.  BIMANUAL:  Tanner V.  Vagina:  No discharge, scant amount of blood as the  patient ended period on November 15.  Cervix is nontender, closed, and very  anterior probably secondary to adhesions involving a C-section.  The  bimanual is limited secondary to body habitus.  There is no uterine  tenderness or adnexal tenderness.   ASSESSMENT AND PLAN:  A 50 year old female, abdominal pain.  1. Constipation.  Needs to be reevaluated by [facility] doctor.  Will start     Lactulose daily and can use up to t.i.d.  2. Daypro 600 mg p.o. b.i.d. for pain.  3. Return to GP for evaluation of hypertension - blood pressure was 180/112.  4. Repeat ultrasound in four to six weeks.  5. Return to clinic in six weeks or as  needed.                                               [name], MD    KL/MEDQ  D:  07/29/2003  T:  07/29/2003  Job:  [id number]

## 2011-01-28 NOTE — Op Note (Signed)
[name], [name]                         ACCOUNT NO.:  [id number]   MEDICAL RECORD NO.:  [id number]                   PATIENT TYPE:  INP   LOCATION:  0471                                 FACILITY:  [facility]   PHYSICIAN:  De Blanch, M.D.         DATE OF BIRTH:  [birth year]   DATE OF PROCEDURE:  10/15/2003  DATE OF DISCHARGE:                                 OPERATIVE REPORT   PREOPERATIVE DIAGNOSIS:  Cystic pelvic mass.   POSTOPERATIVE DIAGNOSIS:  Pararectal cystic mass, ovarian mucinous  cystadenoma.   PROCEDURE:  Resection of retroperitoneal pararectal cystic mass (benign).   SURGEON:  De Blanch, M.D.   ASSISTANT:  Lesly Dukes, M.D.   ANESTHESIA:  General with oral tracheal tube.   ESTIMATED BLOOD LOSS:  20 mL for this portion of the operation.   FINDINGS:  I was called the operating room to evaluate a surprise finding of  a mass deep in the cul-de-sac and perirectal region. The patient had  previously this day undergone a total abdominal hysterectomy and bilateral  salpingo-oophorectomy for what was a benign mucinous cystadenoma.  The mass  was found to be cystic and thin walled deep in the pelvis in the left  perirectal space. This seemed to be attached to the rectum.  Frozen section  returned as this being benign.  In the course of resecting the mass, an area  of the rectum was entered. This was identified and closed in two layers with  a bubble test being negative at the completion of the surgical procedure.   DESCRIPTION OF PROCEDURE:  I was called to the operating room to evaluate  the pelvic mass noted above.  Hysterectomy and bilateral salpingo-  oophorectomy were previously performed and the vaginal cuff was held with  sutures. The left retroperitoneal space was opened identifying the vessels  and ureters. The mass was medial to the ureter and the vessels and deep to  the ureter as well.  The peritoneum on the pelvic sidewall was  incised using  cautery and then grasped and further incised circumferentially around the  obvious mass. Using sharp and blunt dissection and hemostasis achieved with  cautery, we gradually circumscribed the mass dissecting in the perirectal  fat.  As we were able to lift the mass up out of the pelvis, the dissection  revealed that the mass was densely adherent to the rectum on the lateral  aspect. In the course of resecting the mass, the rectum was entered. The  mass was removed from the operative field and submitted for frozen section  returning a benign report. A bubble test was performed using a proctoscope  and a defect was found in the lateral aspect of the rectum on the left. This  was then closed in two layers, the first being mucosa and muscular layer  using 2-0 Vicryl.  A second imbricating layer was then created using 2-0  Vicryl interrupted sutures.  A  repeat bubble test is performed and an  additional defect in the rectum was identified measuring approximately 3 mm  in diameter. This was closed again in two layers with 2-0 Vicryl suture.  A  third bubble test was then performed and no leak was noted. The rectum and  sigmoid colon were maximally distended and the rectosigmoid colon inspected  and no leaks were noted.   The pelvis was irrigated with warm saline, additional antibiotic coverage  was provided by administering Flagyl 500 mg IV at this time.   A retroperitoneal drain was then placed through the cul-de-sac and overlying  the pararectal defect and rectal closure.  This was brought out through a  stab incision in the right lower quadrant and sutured to the skin with 2-0  silk.   At this juncture of the procedure, I left and Lesly Dukes, M.D.  proceeded to close the abdomen.                                               De Blanch, M.D.    DC/MEDQ  D:  10/15/2003  T:  10/16/2003  Job:  [id number]   cc:   Telford Nab, R.N.  [address]   [place], [place] [address]   Lesly Dukes, M.D.

## 2011-01-28 NOTE — Discharge Summary (Signed)
[name], [name]               ACCOUNT NO.:  [id number]   MEDICAL RECORD NO.:  [id number]          PATIENT TYPE:  WOC   LOCATION:  WOC                          FACILITY:  [facility]   PHYSICIAN:  Mobolaji B. Bakare, M.D.DATE OF BIRTH:  1960/11/19   DATE OF ADMISSION:  04/19/2006  DATE OF DISCHARGE:  04/19/2006                                 DISCHARGE SUMMARY   ADDENDUM:  Blood culture report grew Klebsiella pneumonia which is time sensitive.  Sensitive to ampicillin, cefazolin, cefepime, ciprofloxacin, Bactrim  (Septra).   ALLERGIES:  THE PATIENT IS ALLERGIC TO PENICILLIN.   She has received 5 days of IV clindamycin and 2 days of cefepime.  She will  be discharged on Bactrim Double-Strength 1 p.o. b.i.d. for 2 weeks.  She has  remained afebrile and white cell count is normal.  Clindamycin will be  discontinued.      Mobolaji B. Corky Downs, M.D.  Electronically Signed     MBB/MEDQ  D:  04/29/2006  T:  04/29/2006  Job:  [id number]   cc:   Eulas Post [facility]

## 2011-01-28 NOTE — Discharge Summary (Signed)
[name], [name]                         ACCOUNT NO.:  [id number]   MEDICAL RECORD NO.:  [id number]                   PATIENT TYPE:  INP   LOCATION:  5729                                 FACILITY:  [facility]   PHYSICIAN:  Zetta Bills, MD                       DATE OF BIRTH:  1961/09/11   DATE OF ADMISSION:  05/29/2003  DATE OF DISCHARGE:  05/31/2003                                 DISCHARGE SUMMARY   DISCHARGE DIAGNOSES:  1. Pelvic inflammatory disease.  2. Urinary tract infection.  3. Diabetes mellitus.  4. Hypertension.  5. Chronic anxiety.   DISCHARGE MEDICATIONS:  1. Insulin 70/30, 100 units in the morning, 60 units at night.  2. Tequin 400 mg p.o. every day.  3. Lisinopril 10 mg p.o. every day.  4. Xanax 1 mg t.i.d.  5. Nortriptyline 10 mg one p.o. q.h.s.  6. Ambien 10 mg p.o. every day.  7. Famotidine 40 mg one tablet p.o. every day.  8. For pain control, oxycodone 10 mg p.o. q.4h./p.r.n. for pain.   DIET:  The patient was instructed to have a diabetic diet.   DISPOSITION/FOLLOWUP:  The patient was scheduled to come to [facility] on May 22, 2003.  The main issue to be addressed was  a repeat gynecological exam to evaluate for resolution of her pelvic  inflammatory disease as well as assessing of her urinalysis to see whether  there was resolution of the urinary tract infection, that she has been  having.   For history of presenting illness, physical exam, these are as follows.   HOSPITAL COURSE:  This is a 50 year old Caucasian female with a history of  diabetes mellitus as well as chronic headache, and she recently also had a  urinary tract infection/pyelonephritis for which she was treated with  ciprofloxacin as an outpatient for seven days.  The patient, on the day of  presentation, said that she continued to have dysuria, nausea and diffuse  abdominal pain and has a new onset of subjective fevers.  The patient went  to the outpatient  clinic for which she was referred by her __________  doctor, and from the outpatient clinic she also had a CT scan of her lower  abdomen and pelvis that showed a complex cyst in her left ovary as well as  internal septation with cystic lesion in the cul-de-sac.  There was also  presence of loculated fluid in the cul-de-sac seen on CT scan.  For this,  she was referred to a gynecologist but due to financial issues she could not  go to see a gynecologist and as such was brought into the hospital and was  planned to have inpatient treatment for the above problems.   DRUG ALLERGIES:  She is allergic to PENICILLIN.   PAST MEDICAL HISTORY:  1. It is significant to note that she  has had a prior episode of diabetic     ketoacidosis.  2. History of tobacco abuse.  3. History of Pseudomonas cerebri, as such she can not use any oral     contraceptives.  4. History of pre-eclampsia.  5. Status post cesarean section.  6. Status post cholecystectomy.  7. Status post bilateral tubal ligation that she had in 2001.  8. Longstanding and diffuse history of GERD.   REVIEW OF SYSTEMS:  Other than fever and chills, she complains of diffuse  abdominal pain and anorexia.  She has a mild to moderate degree of nausea.   PHYSICAL EXAMINATION:  VITAL SIGNS:  As follows:  Pulse of 126, blood  pressure of 126/61, temperature of 98.4, respiratory rate of 20 per minute,  oxygen saturations of 98% on room air.  GENERAL:  She has no obvious abnormalities that can be discerned.  OCULAR:  Both pupils are equally reactive to light.  Extraocular muscle  movements are all normal on examination.  ENT:  Her throat is clear and both tympanic membranes are normal on  examination.  NECK:  Supple.  She has no jugular venous distention.  No goiter present.  RESPIRATORY:  Both lung fields are clear to auscultation.  CARDIOVASCULAR:  Regular in rate and rhythm.  She has no murmurs.  No  gallops present.  Heart sounds S1 and S2  are normal.  ABDOMEN:  It is noticed that it is soft, not distended.  On palpation there  is the presence of diffuse tenderness, maximum over the flanks.  She has  bilateral costovertebral angle tenderness more so to the right side than to  the left.  Bowel sounds are present as normal.  EXTREMITIES:  It is noted that she has no cyanosis, no edema, and no obvious  deformities.  GENITOURINARY:  Deferred on admission.  SKIN:  She has no obvious skin rashes, and no rashes suggestive of a  meningococcemia .  NEUROLOGIC:  This is a nonfocal exam and it is unrevealing of any  neurological deficit.   ADMISSION LABS:  As follows:  Sodium of 133, potassium 4.0, chloride of 98,  bicarbonate of 24, BUN of 12, creatinine of 1.0, serum glucose is at 400.  Bilirubin is at 0.4, alkaline phosphatase at 106, SGOT of 12, SGPT of 13,  protein of 7.2, albumin of 3.9 and calcium of 9.4.  Urinary pregnancy test,  on admission, is negative.  Urinalysis was done on admission, and it shows  the presence of a few squamous epithelial cells, white blood cells are 11-20  per high-powered field, while red blood cells are 3-6 per high-powered  field, several bacteria and few yeast cells can be seen on urinalysis  microscopic.  A previously done urine culture and sensitivity was positive  for Klebsiella that is resistant to penicillin but highly sensitive to  ceftriaxone.  CBC was done on admission, it showed a hemoglobin of 11.8,  white cell count of 11.1 with absolute neutrophils of 8.1 and a platelet  count of 272.   Her hospital course was as follows:  Problem #1.  Urinary tract infection/pyelonephritis.  With failed therapy as  an outpatient on ciprofloxacin and her urine organisms showing high  sensitivity to ceftriaxone, the patient was started on Rocephin  intravenously and this was changed over to Tequin fluoroquinolone upon discharge.  She was expected to finish two weeks of therapy on the Tequin.    Problem #2.  Pelvic inflammatory disease.  Basically the main  risk factor  for this was thought to be her underlying diabetes mellitus and her regular  unprotected sex with her husband.  The source of the PID could not be  exactly discerned, so a broad spectrum of therapy was given to her other  than the Rocephin to cover for gonococcus.  She was also given Flagyl to  cover for bacterial vaginosis and a single dose of Diflucan was also given  to cover for Candida.  Her ovarian cyst, that was seen on CT scan exam, was  thought to be benign.  This was not causing any pain and the patient was  actually getting symptomatic relief on the second day of antibiotic  treatment.   Problem #3.  Diabetes mellitus.  The patient's insulin doses were titrated  well and this showed good glycemic control.  The patient was gradually  discharged on insulin.  She was told to await further instructions for  restarting her Actos that she was taking prior to discharge.   Problem #4.  Hypertension.  The patient was continued on Lisinopril while an  inpatient and good control was noted of her blood pressure.  A serum fasting  lipid profile was also done.  This showed a total cholesterol of 220,  triglycerides of 275, HDL of 34, LDL of 131.  Based on these results, it is  suggested that as an outpatient this patient should be started on statin.   Problem #5.  Anxiety.  The patient was continued on Xanax, Ambien and  Nortriptyline, and she is expected to continue with this as an outpatient  following discharge.                                                  Zetta Bills, MD    JP/MEDQ  D:  06/27/2003  T:  06/28/2003  Job:  [id number]

## 2011-01-28 NOTE — Consult Note (Signed)
NAMEADRINNE, SZE                           ACCOUNT NO.:  [id number]   MEDICAL RECORD NO.:  [id number]                    PATIENT TYPE:   LOCATION:                                       FACILITY:   PHYSICIAN:  Charlynne Pander, D.D.S.          DATE OF BIRTH:  May 28, 1961   DATE OF CONSULTATION:  06/03/2002  DATE OF DISCHARGE:                                   CONSULTATION   [name] is a 50 year old female referred by Dr. Oretha Ellis for a  dental consultation.  The patient was admitted with a history of  hyperglycemia.  The patient subsequently also had a lower left mandibular  swelling.  Dental consultation was requested to rule out dental etiology for  this lower left mandibular swelling.   PAST MEDICAL HISTORY:  1. Diabetes mellitus - type 2.     A. Initial diagnosis in 2000.     B. History of uncontrolled hyperglycemia.  2. History of cholelithiasis status post cholecystectomy on 11/07/2000 by     Dr. Lebron Conners.  3. Hypertension.  4. Anemia.  5. Status post C-section in 2001.  6. Status post bilateral tubal ligation in March of 2001.  7. History of urinary tract infection - this admission.   ALLERGIES/ADVERSE DRUG REACTIONS:  Augmentin causes nausea and vomiting.   MEDICATIONS:  1. Tequin 400 mg q.d.  2. Clindamycin 300 mg four times daily.  3. Actos 15 mg daily with food.  4. Lisinopril 10 mg daily.  5. Humulin 70/30, 70 units in the morning, 40 units in the evening.  6. Percocet 5/325 one to two tablets every four hours as needed.  7. Lovenox 40 mg subcutaneously every 24 hours.   SOCIAL HISTORY:  The patient is married with children.  The patient with a  history of smoking one pack per day for 25 years.  The patient denies use of  alcohol at this time.   FAMILY HISTORY:  Mother with a history of diabetes mellitus and coronary  artery disease.  Father's health is unknown.   FUNCTIONAL ASSESSMENT:  The patient was independent for ADLs prior to  this  admission.   REVIEW OF SYSTEMS:  Is reviewed from the chart and health history assessment  form - this admission.   DENTAL HISTORY:   CHIEF COMPLAINT:  Dental consultation requested to evaluate a history of  lower left facial swelling.   HISTORY OF PRESENT ILLNESS:  A dental consultation was requested to evaluate  lower left facial swelling.  The patient indicates that she also has had  dental pain associated with the lower left for a couple of weeks.  The  patient indicates that when it hurts, it reaches an intensity of 10/10.  The  patient indicates that she had lower left facial swelling approximately one  week ago.  This has since resolved with some antibiotic therapy.   The patient does not have a  regular dentist.  The patient denies the  presence of dentures.  The patient indicates that her last dental cleaning  was approximately 10 to 12 years ago.  The patient indicates that she was  seen by an oral surgeon (Dr. Chales Salmon) who provided a consultation and  treatment plan options.  The patient did not follow up with Dr. Chales Salmon at  this time.   PHYSICAL EXAMINATION:  GENERAL:  The patient is a well-developed, well-  nourished female in no acute distress.  VITAL SIGNS:  Blood pressure is 143/88, pulse is equal to 95, temperature is  97.9, respirations are 20.  HEAD AND NECK:  There is no obvious left facial swelling.  There is no  evidence of intraoral abscess formation.  The lower left tooth #'s 18, 19,  28, and 29 are present as root segments.  PERIODONTAL:  The patient with chronic periodontitis with plaque __________,  selective areas of gingival recession, incipient tooth mobility and moderate  horizontal vertical bone loss.  There was bilateral mandibular tori present.  DENTITION:  The patient is missing multiple teeth.  The patient has root  segments #'s 18, 19, 28 and 29 present as retained root segments.  ENDODONTIC:  The patient with a history of acute pulpitis  symptoms  associated with the lower left teeth.  The patient points to tooth #'s 18  and 19 as the offending teeth.  The patient also gives a history of swelling  in that area.  Patient with chronic apical periodontitis associated with  tooth #'s 18, 19, 28 and 29.  CROWNS/BRIDGES:  There are no crown or bridge restorations noted.  PROSTHODONTIC:  The patient denies the presence of dentures at this time.  OCCLUSION:  The patient with a poor occlusive scheme secondary to multiple  missing teeth, presence of root segments, super eruption drifting of the  unopposed tooth into the edentulous areas and lack of replacement of the  missing teeth with clinically acceptable dental restorations.   RADIOGRAPHIC INTERPRETATION:  A panoramic x-ray was taken on 06/03/2002.   There are multiple missing teeth.  There are retained root segments, #'s 18,  19, 28 and 29.  There are periapical pathology associated with tooth #'s 18,  19, 28 and 29.  There is super eruption and drifting of the unopposed teeth  into the edentulous areas.  There is moderate horizontal vertical bone loss.   ASSESSMENT:  1. History of left facial swelling.  2. History of acute pulpitis symptoms associated with tooth #'s 18 and 19.  3. Chronic periodontitis with bone loss.  4. Gingival recession.  5. Incipient tooth mobility.  6. Dental caries.  7. Bilateral mandibular lingual tori.  8. Multiple missing teeth.  9. Retained root segments, #'s 18, 19, 28 and 29.  10.      Super eruption and drifting of the unopposed teeth into the     edentulous areas.  11.      Lack of replacement of the missing teeth with clinically acceptable     donor restorations.  12.      Poor occlusal scheme.  13.      Need for evaluation to discontinue Lovenox therapy prior to     invasive dental procedures.    PLAN/SPECIAL RECOMMENDATIONS:  1. I discussed the risks, benefits and complications of various treatment    options in relationship to  her medical and dental conditions.  We     discussed total and subtotal extraction with alveoloplasty, periodontal  therapy, crown and bridge therapy, implant therapy, root canal therapy,     and the replacement of missing teeth as indicated.  The patient currently     wishes to proceed with extraction of tooth #'s 18, 19, 28 and 29 with     alveoloplasty and bilateral mandibular lingual tori reductions.  The     patient also agrees to have a dental cleaning as time and space permits     in the operating room.  The patient will then follow up with a private     dentist of her choice for fabrication of replacement of missing teeth     after adequate healing.  2. Discussion of findings with Oretha Ellis, M.D.  Will discuss the     ability to plan operating room procedure as soon as possible along with     the pre-prosthetic surgery of the mandibular tori reductions with     alveoloplasty as indicated.  Will determine the ability to discontinue     Lovenox therapy at this time.  Will then schedule the operating procedure     as time and space permits.  3. In the meantime suggest continuation of the Clindamycin 300 mg four times     daily to assist in coverage of normal oral bacteria.                                               Charlynne Pander, D.D.S.    RFK/MEDQ  D:  08/07/2002  T:  08/08/2002  Job:  [id number]   cc:   Oretha Ellis, M.D.  [address] [place], [place] [address]  Fax: [phone]   Charlynne Pander, D.D.S.

## 2011-01-28 NOTE — Discharge Summary (Signed)
NAMEKENDALLYN, LIPPOLD               ACCOUNT NO.:  [id number]   MEDICAL RECORD NO.:  [id number]            PATIENT TYPE:   LOCATION:                                 FACILITY:   PHYSICIAN:  Wayne A. Sheffield Slider, M.D.         DATE OF BIRTH:   DATE OF ADMISSION:  DATE OF DISCHARGE:                               DISCHARGE SUMMARY   DISCHARGE SUMMARY BEING DICTATED IN ANTICIPATION OF DISCHARGE LATER  TODAY ON September 14, 2006   ATTENDING:  Dr. Sheffield Slider   DISCHARGE DIAGNOSES:  1. Hyperglycemia.  2. Headache.  3. Hypertension.  4. Vomiting,  5. Visual changes.  6. Hyperlipidemia.  7. Urinary tract infection.   DISCHARGE MEDICATIONS:  1. Nicotine patch 14 mg patch, apply 1 per day.  2. Aspirin 81 mg 1 p.o. daily.  3. Plavix 75 mg 1 p.o. daily.  4. Atenolol 50 mg 1 p.o. b.i.d.  5. Lantus insulin 60 units injected subcu daily.  6. Metformin 500 mg 1 p.o. b.i.d.  7. Zocor 20 mg 1 p.o. q.h.s.  8. Hydrochlorothiazide 25 mg 1 p.o. daily.  9. Lisinopril 20 mg 1 p.o. daily.  10.Cipro 250 mg 1 p.o. b.i.d. x7 more days for 10 total days of      treatment.  11.Oxycodone 20 mg p.o. q. 4 hours p.r.n. pain.   FOLLOWUP:  The patient is to call her primary care doctor, [employer],  and schedule a followup appointment.  She is also to follow up with  neurology on January 10 and ophthalmology on January 9.   PROCEDURES AND DIAGNOSTIC STUDIES:  1. Head CT showed no acute intracranial abnormal.  2. Chest x-ray showing mild bronchitic changes.   ADMISSION HISTORY AND PHYSICAL:  The patient is a 50 year old Caucasian  female with a history of headaches, also with a history of pseudotumor,  who was admitted earlier this month for treatment of both.  During her  initial admission, she was treated with increasing doses of insulin for  hyperglycemia.  She also had a tap with opening pressure of 20 for  hyperglycemia and she also had to evaluate her for pseudotumor.  The tap  had no therapeutic effect.  It was  cultured negative.  She was  discharged home.   Since going home, she has had continued headache and progressive  hyperglycemia.  She called back during the night before admission with  critical highs on her home Glucometer and was advised to present to the  emergency room.  She was subsequently admitted to the teaching service.   HOSPITAL COURSE:  1. Hyperglycemia.  Patient was initially placed on insulin drip and      transitioned to Lantus with sliding scale.  She was also started on      metformin 500 mg 1 p.o. b.i.d.  She was tolerating this well.  Her      CBGs trended down into lower 200s.  While this is not ideal, it is      felt that this was appropriate for discharge.  She can be further  titrated as an outpatient.  At no point did she have an anion gap      acidosis.   1. Headache.  This could possibly again be due to pseudotumor.  It did      have transient partial relief with oxycodone.  She is being      discharged home with this and needs to followup with neurology.  At      no point, did she have any meningeal findings during this      hospitalization.  No tap was reattempted during the second      hospitalization.  Her mental status was at baseline.  She did have      diffuse right leg weakness, but this was at her baseline.  Because      the patient had, had a negative tap during her previous      hospitalization and a normal head CT and no evolving changes in her      neurologic status, it was felt that she was appropriate for      outpatient followup.   1. Hypertension.  Patient was continued on atenolol and      hydrochlorothiazide along with lisinopril with good effect.  She      can continue this as an outpatient.   1. Vomiting.  Likely secondary to headache.  When her headache was      controlled, the vomiting resolved.  She was taking p.o. at the time      of discharge.   1. Visual changes.  These were chronic and noted during the first       hospitalization.  She is follow up with Dr. Emily Filbert.  These appear to      be chronic changes in her periphery of her right.  This was stable      during the hospitalization and not in need of emergent workup.   1. Hyperlipidemia.  Continue Zocor.   1. UTI.  Patient once again had a Klebsiella isolate in her urine.      She was treated with Cipro based on sensitivities and can follow up      with a primary M.D.      Dwana Curd Para March, M.D.    ______________________________  Arnette Norris. Sheffield Slider, M.D.    GSD/MEDQ  D:  09/14/2006  T:  09/14/2006  Job:  [id number]

## 2011-01-28 NOTE — Discharge Summary (Signed)
[name], [name]               ACCOUNT NO.:  [id number]   MEDICAL RECORD NO.:  [id number]          PATIENT TYPE:  INP   LOCATION:  6738                         FACILITY:  [facility]   PHYSICIAN:  Wayne A. Sheffield Slider, M.D.    DATE OF BIRTH:  13-Mar-1961   DATE OF ADMISSION:  09/10/2006  DATE OF DISCHARGE:  09/14/2006                               DISCHARGE SUMMARY   ADDENDUM   DATE OF ADMISSION:  September 10, 2006   DATE OF DISCHARGE:  September 14, 2006   PRIMARY CARE PHYSICIAN:  Patient was previously seen at [facility], however she wishes to transfer her care to [facility] and is unable to return to [facility] due to  outstanding medical bills.   CHANGES IN DISCHARGE MEDICATIONS:  1. Lantus was increased to 70 units injected subcutaneously daily,      prescription provided.  2. Patient was given prescription for oxycodone 5 mg tabs, two tabs      every six hours as needed for pain, maximum of six per day, patient      was given prescription for 60 to hold her over until she has a      followup appointment with her neurologist on January 10.  3. Instead of Nicotine patch prescription, patient was given      prescription for Chantix starter pack as directed with no refills.   Patient remained stable at the time of discharge and was discharged to  home.   Patient has a followup appointment with Dr. Lupita Raider at [facility] on October 03, 2006 at 3:00 p.m.  Patient also  has a followup appointment with her ophthalmologist on September 20, 2006  and her neurologist on September 21, 2006.     ______________________________  Drue Dun, M.D.    ______________________________  Arnette Norris. Sheffield Slider, M.D.    EE/MEDQ  D:  09/14/2006  T:  09/14/2006  Job:  [id number]

## 2011-01-28 NOTE — Discharge Summary (Signed)
[facility]  Patient:    [name], [name]                      MRN: [id number] Adm. Date:  [id number] Disc. Date: [id number] Attending:  Michaelle Copas Dictator:   Brandt Loosen, M.D.                           Discharge Summary  DISCHARGE DIAGNOSES:          1. Insulin-dependent diabetes mellitus.                               2. Hypertension.                               3. Intrauterine pregnancy at 33-5/7 weeks.  DISCHARGE MEDICATIONS:        Routine labor instructions.  Amoxicillin 500 mg b.i.d. x 7 days.  Continue on Humulog 75/25 100 units b.i.d. and Humulog regular 60 units.  HOSPITAL COURSE:              This 50 year old white female, gravida 3, para 1-0-1-1, at 33-5/7 weeks was admitted with insulin-dependent diabetes mellitus nd hypertension for rule out preeclampsia.  She was placed on labor and delivery and insulin drip was begun.  Blood pressures were monitored.  24-hour urine was obtained as well as preeclampsia labs showing her CBC within normal limits with a hemoglobin of 9.5.  Basic metabolic panel was normal.  Her albumin 2.8 normal for pregnancy.  Her LDH was 92.  Uric acid was high at 7.8.  Her glycosolated hemoglobin was 6.6.  Urinalysis was performed showing WBC, many bacteria. Urine culture showed Klebsiella pneumonia.  The patient was discharged the following ay with simple UTI and placed on amoxicillin.  Blood pressure was stable, completely asymptomatic.  Discharged to home with close follow-up at [facility]. DD:  02/25/00 TD:  02/29/00 Job: [id number]

## 2011-01-28 NOTE — Discharge Summary (Signed)
NAMEMELLANY, DINSMORE               ACCOUNT NO.:  [id number]   MEDICAL RECORD NO.:  [id number]          PATIENT TYPE:  OIB   LOCATION:  3701                         FACILITY:  [facility]   PHYSICIAN:  Nanetta Batty, M.D.   DATE OF BIRTH:  April 03, 1961   DATE OF ADMISSION:  08/11/2005  DATE OF DISCHARGE:  08/12/2005                                 DISCHARGE SUMMARY   DISCHARGE DIAGNOSES:  1.  Claudication secondary to peripheral vascular disease as evidenced by      abnormal ultrasound of the lower extremities.  2.  Status post peripheral vascular angiography with stenting of the right      common iliac artery.  3.  Hypertension, poor control on maximum medical therapy.  4.  Hyperlipidemia.  5.  Diabetes mellitus.  6.  Family history of coronary artery disease.   This is a 50 year old female, a patient of Dr. Allyson Sabal, who was seen in our  office with complaints of claudication and we scheduled her for at that time  she was found to have an uncontrolled hypertension and the patient underwent  Cardiolite stress test, 2D echo, and abdominal ultrasound, as well as lower  extremity ultrasound.  Her ultrasound of the lower extremities revealed  abnormal result and abdominal ultrasound revealed normal velocity.  To  further evaluate the abnormalities of the lower extremity ultrasound, the  patient was scheduled for peripheral vascular angiography on August 11, 2005.  She was brought to the hospital to the short stay unit.   HOSPITAL PROCEDURES:  1.  Peripheral vascular angiography performed by Dr. Allyson Sabal.  It showed a      normal abdominal aorta and normal renal arteries.  The left lower      extremity revealed 20-30% __________ left common iliac narrowing and 50-      60% segmental ___________ disease with three-vessel runoff.  The right      lower extremity revealed 70% segmental proximal left  common iliac      stenosis and occlusion to 50% mid right SFA narrowing with three-vessel  runoff.  2.  The patient underwent PTA and stenting of the right common iliac artery      with reduction of occlusion from 70 to 0%.  She tolerated the procedure      well.   Next morning, her blood work revealed normal __________ of hemoglobin at  12.0, hematocrit 34.8.  Her BUN was 13, creatinine 1.0.   The patient was evaluated by Dr. Allyson Sabal.  Her groin puncture site did not  developed any signs of hematoma or ecchymosis.  She was sent home in a  stable condition.   DISCHARGE MEDICATIONS:  1.  Actos 30 mg every day.  2.  Lisinopril 40 mg every day.  3.  Aciphex 20 mg every day.  4.  Atenolol 100 mg b.i.d.  5.  Caduet 10/20 mg every day.  6.  Clonidine 0.2 mg b.i.d.  7.  Diovan HCT 160/12.5 b.i.d.  8.  Aspirin 81 mg every day.  9.  Plavix 75 mg every day.   DISCHARGE ACTIVITY:  No driving,  no heavy lifting greater than 5 pounds, no  strenuous activity for three days post cath.   DISCHARGE DIET:  Low fat, low cholesterol diet.   DISCHARGE FOLLOWUP:  1.  Appointment to evaluate lower extremity ultrasound post procedure was      scheduled on August 23, 2005 at 2 p.m.  2.  Dr. Allyson Sabal will see the patient in followup on August 25, 2005 at 10:30      a.m.      Raymon Mutton, P.A.      Nanetta Batty, M.D.  Electronically Signed    MK/MEDQ  D:  08/12/2005  T:  08/12/2005  Job:  [id number]   cc:   Nanetta Batty, M.D.  Fax: [phone]

## 2011-01-28 NOTE — Group Therapy Note (Signed)
NAMELENETTE, RAU               ACCOUNT NO.:  [id number]   MEDICAL RECORD NO.:  [id number]          PATIENT TYPE:  WOC   LOCATION:  [facility]                   FACILITY:  [facility]   PHYSICIAN:  [name], MD      DATE OF BIRTH:  1961-03-03   DATE OF SERVICE:  05/31/2005                                    CLINIC NOTE   The patient is a 50 year old female who is status post TAH/BSO whose main  complaint today is menopausal symptoms.  She cannot sleep, is moody, has no  sex drive.  She stopped the Premarin over a month ago because she said it  was not helping.  However, this is probably a good idea as she now has a  newly diagnosed breast mass from a ductogram done today.  We discussed about  Effexor and how this could help her mood and possibly help her hot flashes.  She agreed to this.  She said Ambien, Sonata, and Lunesta do not work for  her sleep problems.  She thinks most of her sleep problems are due to the  restless leg syndrome and hot flashes.  We will try Effexor today and she is  to go back to Dr. Alison Murray, her family practice doctor, for restless leg  treatment.  She also has an appointment with Dr. Orson Slick, a general surgeon,  for the breast mass.  Patient is to come back in six weeks to see how she is  doing on the Effexor and if she is not better we could possibly think about  clonidine.           ______________________________  [name], MD     KL/MEDQ  D:  05/31/2005  T:  06/01/2005  Job:  [id number]

## 2011-01-28 NOTE — Op Note (Signed)
[facility]  Patient:    [name], [name]                      MRN: [id number] Proc. Date: 11/07/00 Adm. Date:  [id number] Attending:  Starr Sinclair CC:         Dario Guardian, M.D.  Venita Lick. Pleas Koch., M.D. [facility]   Operative Report  PREOPERATIVE DIAGNOSIS:  Symptomatic gallstones.  POSTOPERATIVE DIAGNOSIS:  Symptomatic gallstones.  OPERATION PERFORMED:  Laparoscopic cholecystectomy.  SURGEON:  Dr. Orson Slick.  ASSISTANT:  Dr. Zachery Dakins.  ANESTHESIA:  General.  DESCRIPTION OF PROCEDURE:  After adequate anesthesia and monitoring and routine preparation and draping of the abdomen, I made a small infraumbilical incision, dissected down to the fascia, opened it longitudinally and cleared the viscera from the undersurface of the peritoneum after opening it bluntly. I put in a #0 Vicryl pursestring suture and secured a Hasson cannula and inflated the abdomen with CO2. I found no abnormalities except for a slightly inflamed gallbladder. After anesthetizing all the port sites and putting in three additional ports and positioning the patient head up foot down and tilted to the left, I retracted the fundus of the gallbladder toward the head and the infundibulum of the gallbladder laterally. There were no adhesions. There was a lot of fat around the distal part of the gallbladder. I dissected out the cystic duct and the cystic artery. I found the cystic duct to be fairly large and so I dissected it quite extensively noting its emergence from the gallbladder quite clearly until I was absolutely certain that it was the common bile duct. I then clipped it with four clips and divided between those closer to the gallbladder. I dissected out the cystic arteries anterior and posterior branches and clipped and divided them similarly. I then dissected the gallbladder from the gallbladder fossa utilizing the hook and spatula cautery and got hemostasis with  the cautery, irrigated the area and removed the irrigant prior to detaching the gallbladder from the liver and then detached the gallbladder from the liver and removed it through the umbilical incision. I then irrigated again and removed the irrigant and assured good hemostasis and security of the clips. I released CO2 from the abdomen, took out the ports and closed the skin over all incisions with intracuticular 4-0 Vicryl and Steri-Strips. The umbilical incision was closed by tying the pursestring suture in the fascia and the skin closure as well. The patient was stable throughout the procedure. Sponge, needle and instrument counts were correct. DD:  11/07/00 TD:  11/08/00 Job: [id number]

## 2011-01-28 NOTE — Group Therapy Note (Signed)
REFERRING PHYSICIAN:  [facility], Dr. Christell Constant.   PURPOSE OF EVALUATION:  Evaluate and treat chronic back and leg pain  bilaterally.   HISTORY OF PRESENT ILLNESS:  [name] is a 50 year old female who was  referred to this office by [facility] for evaluation and  treatment of chronic bilateral leg pain.   Minimal medical records accompanied the patient.  It does appear that the  patient had an MRI scan of her brain in June of 2005, which was normal.  She  subsequently has had an MRI scan of her lumbar spine done June 07, 2004.  This showed mild degenerative changes of the lumbar spine.  There  were disc bulges at L3-4 with osseus overgrowth and minimal retrosubluxation  on L3 on L4 with resultant mild encroachment upon the central canal.  There  was also hypertrophy of the posterior elements on the right at T11-12 but no  discernible canal stenosis or neural foraminal stenosis.  No focal disc  extrusion was seen.   Patient subsequently came under the care of Dr. Otho Darner at  [facility] in April of 2006.  At that time, she was referred for a  physical therapy evaluation and prescribed Percocet.   As of November 07, 2005, patient was treated with OxyContin Extended Release  10 mg q.12h.  She had stopped Percocet at that time.  She reports that she  gained no relief with the controlled release OxyContin.  She was also using  Naprosyn and Cymbalta at that time.   November 19, 2005, the patient underwent a repeat MRI scan of her lumbar spine  which showed stable disc bulge with mild retrolisthesis at L3-4.  There was  stable mild lower lumbar facet arthrosis.  There was stable right facet  hypertrophy and/or flavum hypertrophy at T11-12.   The patient reports that she subsequently was tried on MS Contin and gained  no relief on that.  She also reports that she tried Vicodin and also  Duragesic patch, also with no benefit.  She  reports that she had used  oxycodone 30 mg immediate release and that gave her some relief, although it  made her too groggy.  She reports that she has not had any pain medicines  since she stopped her extended release oxycodone approximately last week or  so.   The patient has seen Dr. Allyson Sabal, a local cardiologist. She apparently had a  stent placed in her leg for vascular problems but other than being started  on Plavix, Dr. Allyson Sabal has told her that he does not feel that her leg pain is  substantially related to her peripheral vascular disease.  She reports that  she is due for follow-up with Dr. Allyson Sabal periodically.   Presently, the patient reports that she has pain in her legs constantly.  She reports that she is unable to bend or do any lifting or any extensive  walking.  She reports that her recent blood sugar has been in the 290 range  and that is followed by the people at [facility].   PAST MEDICAL HISTORY:  1.  Non-insulin-dependent diabetes mellitus.  2.  Peripheral vascular disease.  3.  Bursitis of the right hip.  4.  Anxiety.  5.  Hypertension.  6.  Pseudotumor cerebri with subsequent spinal tap x6.  7.  Sciatica of the right hip.  8.  Stent placement of the heart last week.  9.  Stent of  the right leg in October of 2006.  10. Hysterectomy and bilateral salpingo-oophorectomy.  11. History of breast biopsy.  12. Dyslipidemia.   ALLERGIES:  1.  Intolerant of OXYCODONE 30 mg which caused nausea.  2.  AUGMENTIN which caused itch and rash.   FAMILY HISTORY:  The patient's mother is deceased from gallbladder gangrene.  She also suffers from cardiac disease along with kidney disease and  hypertension.   SOCIAL HISTORY:  The patient is married with one grown child and one 6-year-  old son.  She does not use alcohol and reports that she is in the process of  stopping tobacco use. She had been smoking two packs of cigarettes per day  but now is down to half  pack of cigarettes per day.  She is on Chantix  medication and reports that her last day of tobacco use will be this Friday.  She reports that she previously worked with her husband in [employer] but  is not able to do that at this time.   MEDICATIONS:  1.  Aspirin 81 mg daily.  2.  Plavix 75 mg daily.  3.  Diovan 80 mg one tablet b.i.d.  4.  Diovan 65 one tablet twice a day.  5.  Ambien 10 mg p.r.n.  6.  Amitriptyline 30 mg p.r.n.  7.  Lisinopril 400 mg b.i.d.  8.  AcipHex 20 mg daily.  9.  Clonidine 0.3 mg b.i.d.  10. Xanax 1 mg p.r.n.  11. Chantix 0.5 mg b.i.d.  12. Atenolol 100 mg b.i.d.  13. Caduet 10 one tablet daily.  14. Actos 30 mg daily.   REVIEW OF SYSTEMS:  Positive for night sweats, reflexes, poor appetite, limb  swelling and elevated blood sugars.   PHYSICAL EXAMINATION:  GENERAL APPEARANCE:  A reasonably well-appearing  middle age adult female in mild to moderate acute discomfort.  VITAL SIGNS:  Blood pressure 153/97 with a pulse of 118, respiratory rate 16  and O2 saturation 99% on room air.  NEUROLOGIC:  She ambulates without any assistive device with an antalgic  gait.  She is able to toe walk and heel walk with mild difficulty.  Upper  extremity range of motion was full with some hesitation and complaints of  stiffness.  Upper extremity exam showed normal bulk and tone throughout.  Reflexes were 2+ and symmetric and sensation was intact to light touch  throughout.  Strength was 5-/5 throughout the bilateral upper extremities.   Lower extremity exam showed 4-/5 strength throughout the bilateral lower  extremities with give away weakness.  Sensation was intact to light touch  throughout the bilateral lower extremities.  She ambulates without any  assistive device.  Reflexes were 2+ and symmetrical.   In a supine position, straight leg raise was negative to 30 degrees and  range of motion was normal bilaterally.   IMPRESSION:  1.  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  2.  Chronic bilateral leg pain.  3.  History of mild/moderate peripheral vascular disease, status post      stenting of lower extremity vasculature.   In the office today, we did find that the patient has had poor relief with  any of the controlled release medicines.  She prefers to have the immediate  release medicines and reports that only those have helped her pain in the  past.  She specifically reports that she used oxycodone 30 mg, but I have  told her that I will not be able  to prescribe that at the frequency that she  had been using previously; i.e., three to four tablets per day.  We have,  instead, placed her on oxycodone 5 mg to be used one to two tablets p.o.  t.i.d. p.r.n.  That is a maximum of six tablets per day.  We will see how  she responds with that and then make adjustments as necessary in the future.  Will plan on seeing her in follow-up in approximately four to five weeks'  time.           ______________________________  Ellwood Dense, M.D.     DC/MedQ  D:  11/30/2005 13:26:10  T:  12/01/2005 15:07:43  Job #:  [id number]

## 2011-01-28 NOTE — Discharge Summary (Signed)
NAME:  [name], [name]                         ACCOUNT NO.:  [id number]   MEDICAL RECORD NO.:  [id number]                   PATIENT TYPE:  INP   LOCATION:  0471                                 FACILITY:  [facility]   PHYSICIAN:  Lesly Dukes, M.D.              DATE OF BIRTH:  [birth year]   DATE OF ADMISSION:  10/15/2003  DATE OF DISCHARGE:  10/19/2003                                 DISCHARGE SUMMARY   HOSPITAL COURSE:  The patient is a 50 year old white female with a history  of insulin-dependent diabetes, chronic headaches, pseudotumor cerebri, and  hypertension, as well as recurrent pyelonephritis who was found to have  pelvic masses and abdominal pain on a recent hospital visit to [facility].  The patient was referred to the [facility] and determined that surgery  was necessary.  The patient had a consult with the gynecologists from [facility],  Dr. Stanford Breed and Dr. Kyla Balzarine, who agreed to be on backup for the  procedure.  On October 15, 2003 the patient underwent exploratory laparotomy  with a TAH/BSO without complication.  There was noted to be a second mass  perirectally retroperitoneal.  Dr. Stanford Breed was called in for a  consult who scrubbed in to remove the perirectal mass.  The mass was removed  intact; however, there was a small hole in the rectum which was repaired in  multiple layers.  Proctoscopy was done by Dr. Stanford Breed and the rectum  was noted to be intact at the end of the repair.  The patient was placed on  Flagyl and a second-generation cephalosporin through the remainder of her  hospitalization.  Her postoperative course was uncomplicated.  The patient  had one low-grade fever of 100.4, never developed any infection.  Her  diabetes was well controlled and her hypertension was also well controlled  during this time.  The patient was discharged on February 6 without  complication.   POSTOPERATIVE MEDICATIONS:  1. The patient was to resume all prehospital  medications.  2. Percocet for pain.  3. Lactulose daily to t.i.d. p.r.n. for constipation   DISCHARGE INSTRUCTIONS:  1. Come to [facility] with fevers or severe abdominal pain.  2. Return to clinic October 21, 2003 for staple removal.  3. Nothing per rectum for 6 weeks.  4. Avoid constipation.                                               Lesly Dukes, M.D.    [name]  D:  10/27/2003  T:  10/27/2003  Job:  [id number]

## 2011-01-28 NOTE — Op Note (Signed)
NAMEAYAT, DRENNING                         ACCOUNT NO.:  [id number]   MEDICAL RECORD NO.:  [id number]                   PATIENT TYPE:  INP   LOCATION:  5530                                 FACILITY:  [facility]   PHYSICIAN:  Charlynne Pander, D.D.S.          DATE OF BIRTH:  1961-04-27   DATE OF PROCEDURE:  DATE OF DISCHARGE:                                 OPERATIVE REPORT   PREOPERATIVE DIAGNOSES:  1. History of left facial swelling.  2. Acute pulpitis.  3. 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  4. Accretions.  5. Chronic periodontitis.  6. Bilateral mandibular lingual tori.   POSTOPERATIVE DIAGNOSES:  1. History of left facial swelling.  2. Acute pulpitis.  3. 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  4. Accretions.  5. Chronic periodontitis.  6. Bilateral mandibular lingual tori.   OPERATION/PROCEDURE:  1. Dental examination.  2. Surgical extraction of teeth #18, 19, 28 and 29.  3. Two quadrants of alveoplasty.  4. Lower and lower right quadrant mandibular tori reductions.  5. Gross debridement of the remaining dentition.   SURGEON:  Charlynne Pander, D.D.S.   ASSISTANT:  1. Elliot Dally (Sales executive).  2. Edrick Oh (dental student).   ANESTHESIA:  General anesthesia via nasoendotracheal tube.   MEDICATIONS:  1. Clindamycin 600 mg IV perioperatively.  2. Local anesthesia with total of 3 carpules, each containing 9 mg of     Marcaine with 0.009 mg of Epinephrine as well as 2 carpules each     containing 76 mg of Xylocaine with 0.018 mg of Epinephrine.   SPECIMENS:  Four teeth which were discarded.   DRAINS/CULTURES:  None.   ESTIMATED BLOOD LOSS:  Less than 100 ml.   FLUIDS:  1200 ml of Lactated Ringer's solution.   COMPLICATIONS:  None.   INDICATIONS FOR PROCEDURE:  The patient had a history of left facial  swelling.  The patient was then referred for a dental evaluation to rule out  dental etiology as the source of the  infection.  The patient was then  evaluated and treatment planned for extraction of root segments #18, 19, 28  and 29 with alveoplasty as indicated.  The patient also had bilateral  mandibular tori which would need to be reduced prior to fabrication of  future partial dentures.  The patient also agreed to have his remaining  teeth cleaned as part of a gross debridement procedure if there was time  left at the end of the dental medicine procedure.  The patient was noted to  have chronic periodontal disease, chronica apical periodontitis, multiple  root segments, the presence of accretions, and the presence of bilateral  mandibular lingual tori.  The preceding necessitated the removal of multiple  teeth, pre-prosthetic surgery, and removal of accretions to reduce the risk  of dental disease which could affect the patient's systemic health and  glycemic control.   DESCRIPTION  OF PROCEDURE:  The patient was brought to the main operating  room #16.  The patient was then placed in the supine position on the  operating room table.  The patient was then intubated utilizing a  nasoendotracheal tube.  The patient was then prepped and draped in the usual  manner for oral surgical procedure.  The oral cavity was thoroughly examined  with the findings as noted above.  A throat pack was placed at this time.  The patient was then readied for the oral surgical procedure as follows:   Local anesthesia was administered utilizing a total of three carpules, each  containing 9 mg of Marcaine with 0.009 mg of Epinephrine as well as two  carpules each containing 76 mg of Xylocaine with 0.018 mg of Epinephrine.  This local anesthesia was administered sequentially over the two hour long  procedure.   The mandibular quadrants were first approached.  Anesthesia was delivered as  previously described.  The mandibular right quadrant was then approached.  A  15 blade incision was made from the distal of #31 to the  mesio of #27.  The  surgical flap was then reflected both buccally and lingually.  Root segments  #28 and 29 were subluxated with a series of straight elevators.  Tooth #29  was removed with a 151 forceps without complications.  The coronal portion  of tooth #28 was then fractured off with an elevator.  This necessitated the  removal of buccal and interseptal bone with a surgical hand piece and bur  and copious amounts of sterile saline.  After appropriate amount of bone  reduction, the root segment was finally elevated out after multiple  fractures of various parts of the root apparatus.  Alveoplasty was then  performed utilizing rongeurs and bone file.  Lingual access was then made to  the mandibular right lingual torus.  The surgical hand piece and bur and  copious amounts of sterile saline were then utilized to remove the lower  right torus without complications.  Alveoplasty was achieved utilizing  rongeurs and bone file and copious amounts of sterile saline.  The surgical  site was then irrigated x 2 with sterile saline.  The tissues were then  approximated and trimmed appropriately.  The surgical site was then closed  from the distal of #31 through the distal of #27 utilizing 3-0 chromic  suture material in a continuous interrupted suture technique x 1.  Two  separate intrapapillae interrupted sutures were then placed between teeth  #26 and 27 and 25 and 26.   The mandibular left quadrant was then approached.  A 15 blade incision was  made from the distal of #17 through the mesio of #20.  The surgical flap was  then reflected.  Root segments #18 and 19 were then subluxated and they were  removed with a 151 forceps without complications.  The flap was then  extended lingually to tooth #24, the flap was then further reflected,  alveoplasty was then performed using rongeurs and bone file.  No significant purulence was noted in the extraction sites of these root segments.  The   mandibular left torus was then visualized and removed with a surgical hand  piece and bur and copious amounts of sterile saline.  Further alveoplasty  was performed using rongeurs and bone file.  The surgical site was then  irrigated x 2 with copious amounts of sterile saline.  The tissues were then  approximated and trimmed appropriately.  The surgical  site was then closed  from the distal of #17 through the mesial of #20 utilizing a 3-0 chromic gut  suture material in continuous interrupted suture technique x 1.  Two  interproximal sutures were then placed between teeth #21 and 22 and #22 and  23 to further close the surgical site.   The remaining dentition was then approached.  A Cavitron scaler was then  utilized to remove the significant accretions.  Selective hand curettes were  then utilized to remove further accretions.  The Cavitron was then again  utilized to re-define the removal of the accretions.  At this point in time  the entire mouth was irrigated with copious amounts of sterile saline.  The  patient was examined for complications, seeing none, the dental medicine  procedures were deemed to be complete.  The throat pack was removed at this  time.  The patient was then handed over to the anesthesia team for final  disposition.  After an appropriate amount of time, the patient was then  extubated and taken to the post-anesthesia care unit with stable vital signs  and good oxygenation level.  All counts were correct for the dental medicine  procedure.                                                 Charlynne Pander, D.D.S.    RFK/MEDQ  D:  06/04/2002  T:  06/05/2002  Job:  [id number]   cc:   Talmage Coin, M.D.  [address]  [place], [place] [address]

## 2011-01-28 NOTE — Group Therapy Note (Signed)
[name], [name]                         ACCOUNT NO.:  [id number]   MEDICAL RECORD NO.:  [id number]                   PATIENT TYPE:  OUT   LOCATION:  [facility]                           FACILITY:  [facility]   PHYSICIAN:  [name], MD                   DATE OF BIRTH:  Mar 01, 1961   DATE OF SERVICE:  06/17/2003                                    CLINIC NOTE   HISTORY OF PRESENT ILLNESS:  The patient is a 50 year old para 2-0-0-2  female, LMP June 12, 2003 referred from [facility] for left ovarian mass,  abdominal pain, and fever.  The patient had been treated for pyelonephritis  about five weeks ago.  Was not responding to outpatient management and was  admitted to [facility] two weeks ago for three days and was treated with IV  antibiotics.  The patient felt better, but now is starting to have lower  abdominal pain and fevers periodically 100-103.  The patient had a CT that  showed a fatty liver, a left ovary measuring 5 x 3 x 3.5, and a complex mass  measuring 3.9 x 4.7 in the left ovary.  There were septations in this mass.  There was a second 5.4 x 3.3 x 5.8 simple loculated area in the cul-de-sac  that was noted to be separate from the adnexa and follow-up ultrasound had  shown the same findings.  It was noted that they did not believe this was an  abscess on the CT.  The patient has also noted a 5 pound weight loss in one  week.  The patient also complains of irregular menses since 2001 cesarean  section.  Periods are sometimes two to three times a month with latter days  being dark black blood.   REVIEW OF SYSTEMS:  No chest pain.  No shortness of breath.  No nausea,  vomiting, dizziness.  There has been a decrease in urinary frequency.  The  patient only urinates t.i.d. where she used to urinate up to 10 times a day.   PAST MEDICAL HISTORY:  1. She has had diabetes since 2001.  She is on Humulin 70/30 taking 110     units in the morning and 75 units at night.  2. She has  hypertension.  3. 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   PAST SURGICAL HISTORY:  1. Cesarean section in 2001.  2. Cholecystectomy 2002.  3. Four abscessed teeth removed in 2003.   GYN HISTORY:  Irregular menses since 2001 birth.  She has had a BTL with her  cesarean section.  She also has history of abnormal Paps never requiring  treatment.  Her last Pap smear in March 2004 was normal.  Other pertinent  tests was a negative colonoscopy in 2004 other than hemorrhoids.  Last  mammogram was May 2003 reported as normal per patient.  MEDICATIONS:  1. Lisinopril 10 mg.  2. Aciphex 20 mg.  3. Nortriptyline 25 mg.  This is for tingling hands and feet.  4. Xanax t.i.d.   PHYSICAL EXAMINATION:  VITAL SIGNS:  Temperature 99.5, pulse 104, blood  pressure 150/87, weight 201 pounds, height 5 feet 6 inches.  GENERAL:  Well-nourished, well-developed, no apparent distress.  BREASTS:  No masses.  No skin changes.  No lymphadenopathy.  ABDOMEN:  Obese, nontender, nondistended.  No rebound.  No guarding.  No  fluid wave.  PELVIC:  External genitalia:  There are old scars from old cellulitis  infections, otherwise Tanner V.  Vagina:  Minimal amount of blood.  No  discharge.  Cervix appears normal.  It is closed.  Mild tenderness but no  CMT.  Bimanual is limited secondary to body habitus, but diffusely tender on  bimanual.  No masses felt.  RECTAL:  Negative.  Hemoccult was sent.   ASSESSMENT/PLAN:  A 50 year old female with two pelvic masses and irregular  bleeding.  Working diagnosis for adnexal masses include abscess versus  neoplasm.  1. Endometrial biopsy done and cultures of the cervix were also done.  2. CA-125, CBC, FSH, CMP, UA, urine culture, and microalbumin sent.     Hemoglobin A1C also sent.  3. Mammogram and chest x-ray ordered.  4. Request for recent admission to [facility] medical records and discharge     summary requested.  5.  The patient to start taking temperature diary.  6. Return to clinic in one week to evaluate test results.  The patient     instructed to come to [facility] if fever returns or abdominal pain worsens.                                               [name], MD    KL/MEDQ  D:  06/17/2003  T:  06/17/2003  Job:  [id number]

## 2011-01-28 NOTE — Consult Note (Signed)
NAMEPEARL, BENTS                         ACCOUNT NO.:  [id number]   MEDICAL RECORD NO.:  [id number]                   PATIENT TYPE:  OUT   LOCATION:  GYN                                  FACILITY:  [facility]   PHYSICIAN:  John T. Kyla Balzarine, M.D.                 DATE OF BIRTH:  19-Mar-1961   DATE OF CONSULTATION:  10/02/2003  DATE OF DISCHARGE:                                   CONSULTATION   CHIEF COMPLAINT:  Consultation regarding management of a presumed benign  adnexal mass and dysfunctional uterine bleeding.   HISTORY OF PRESENT ILLNESS:  The patient has a four to six-month history of  lower abdominal discomfort that increased with activity.  She rates this as  a 9 on a scale up to 10 and classifies it as constant, increasing with  activity but not related to food.  Percocet improves the pain to a 5 or 6.  She notes increasing deep dyspareunia over the time interval.  Over the past  five to six months she has noted increase in urinary urgency in addition to  loss of urine with laughing and straining.  She has been treated with  antibiotics x2 for urinary tract infections.  She has noted constipation  improved with Metamucil and had a negative colonoscopy with the only  significant finding of hemorrhoids.   She has had evaluation including several ultrasounds and CT scans of the  abdomen and pelvis dating back to the end of last summer, which revealed  bilateral ovarian cystic masses.  In aggregate, the masses have not changed  in size.  Most recent scan obtained August 28, 2003, reveals a large  simple-appearing cystic lesion in the pelvic cul-de-sac measuring 6.1 x 4.3  x 5.5, not significantly changed since her prior ultrasound from October  2004.  Left adnexal cyst measures 4.9 x 5.9 x 3.9, described as complex  with no evidence of thickened septa or solid mural nodules in the  description that this was septated.  This also had not significantly changed  in size compared to  the prior study.  There was no evidence of free fluid or  uterine abnormalities.  The patient has been evaluated for tumor markers  including CA125 value of 18.4 and normal HCG.  The patient on several  occasions stated that she had had multiple ruptured ovarian cysts in the  past and her current pain was very similar.  It should be noted that she has  had irregular heavy menses with occasions of intermenstrual bleeding and no  postcoital bleeding.  She is up to date on cytology and endometrial biopsy  on June 18, 2003, was proliferative.   PAST MEDICAL HISTORY:  Past medical history is complicated, including adult-  onset diabetes managed with spot insulin between 2000 and 2004, currently  only being managed with diabetic diet and frequent blood sugar testing.  The  patient has  a chronic diagnosis of pseudotumor cerebri of unknown cause.  She has longstanding hypertension and a questionable diagnosis of diabetic  neuropathy.   MEDICATIONS:  Spot regular insulin, lisinopril and hydrochlorothiazide,  nortriptyline, and Percocet p.r.n.   ALLERGIES:  AUGMENTIN (nausea).   PAST SURGICAL HISTORY:  NSVD x1, cesarean section x1, bilateral tubal  ligation, laparoscopic cholecystectomy, and tooth extraction.   FAMILY MEDICAL HISTORY:  Significant for maternal cousin with breast cancer  in her 97s, maternal grandmother with an unknown primary site of malignancy.  Multiple family members have adult-onset diabetes.   PERSONAL AND SOCIAL HISTORY:  The patient is married.  She smokes one pack  per day of tobacco.  Denies ethanol.   REVIEW OF SYSTEMS:  GENERAL:  Denies weight loss, anorexia, fever or chills.  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  NEUROLOGIC:  Headache associated with pseudotumor cerebri.  CARDIOPULMONARY:  Postpartum  pneumonia following the birth of a child.  GASTROINTESTINAL:  No change.  PELVIC/OB/GYN:  As per HPI.  ENDOCRINE:  Diabetes or pre-diabetes  as above,  although not on any oral hypoglycemics.  NEUROLOGIC:  Numbness and tingling,  which has been described to her as diabetic neuropathy.   PHYSICAL EXAMINATION:  VITAL SIGNS:  Weight 196 pounds.  Vital signs stable  and afebrile.  GENERAL:  The patient is alert and oriented x3, in no acute distress.  CHEST:  Lung fields are clear.  CARDIAC:  Heart sounds are regular rate and rhythm.  BACK:  There is no back or CVA tenderness.  LYMPHATIC:  There is no pathologic lymphadenopathy.  ABDOMEN:  Obese, soft, and benign without ascites, mass, hernia, or  organomegaly.  EXTREMITIES:  Full strength and range of motion.  DTRs are intact, and I am  unable to demonstrate a sensory neuropathy.  PELVIC:  External genitalia and BUS are normal to inspection and palpation.  The vagina is well-supported without mucosal lesions.  Cervix is mobile and  normal in appearance.  Bimanual and rectovaginal examination reveal  retroverted uterus with fullness in the left adnexa, approximately 5 cm.  I  am unable to palpate a right adnexal mass.  Palpation does reproduce a  component of the patient's pain.   ASSESSMENT:  1. 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  2. Chronic lower abdominal/pelvic pain.   I had a long discussion with the patient, discussing treatment options.  I  think because she has had persistent cysts that the best course would be to  recommend exploratory laparotomy with a TAH-BSO.  Because the risk of  malignancy is extremely low in this patient, I do not believe that we would  necessarily need to have a GYN oncologist scrubbed.  An alternative would be  to consider treatment with Lupron to effect ovarian suppression.  I would be  extremely reluctant to try ovarian suppression with oral contraceptives  because of pseudotumor.                                               John T. Kyla Balzarine, M.D.   JTS/MEDQ  D:  10/01/2003  T:  10/02/2003  Job:  [id number]   cc:   Lesly Dukes,  M.D.   Telford Nab, R.N.  [address]  [place], [place] [address]

## 2011-01-28 NOTE — Group Therapy Note (Signed)
NAME:  [name], [name]                         ACCOUNT NO.:  [id number]   MEDICAL RECORD NO.:  [id number]                   PATIENT TYPE:  OUT   LOCATION[facility]  [facility]                           FACILITY:  [facility]   PHYSICIAN:  [name], MD                   DATE OF BIRTH:  03/27/1961   DATE OF SERVICE:  07/01/2003                                    CLINIC NOTE   CHIEF COMPLAINT:  [name] is a 50 year old para 2-0-0-2 female who follows  up from last week's visit.  The patient has recurrent urinary tract  infections and pyelonephritis that is not responding to oral antibiotics.  The patient also has two pelvic masses.  The patient had follow-up  ultrasound done today which just showed essentially no change.  The cul-de-  sac cyst was 5.3 x 5.8 x 4.3 cm with no adjacent ovarian tissue and then the  left adnexa showed a complex cystic structure 3.8 x 3.8 x 3.9 with ovarian  tissue along one margin.  Once again, this is of little change from the  prior CT and ultrasound over six weeks ago.  The patient reports continued  dysuria, back and abdominal pain, and low-grade fevers - the highest being  100.5 one evening last week.  No nausea, vomiting, diarrhea.  The patient  does report a three-pound weight loss this past week.  However, she is  trying to lose weight secondary to her diabetes - she is trying to get her  sugar under control.  On exam the patient's abdomen was soft, nontender,  nondistended.  Positive CVA tenderness bilaterally.  At this time we  discussed admitting the patient to the hospital.  The patient refused to be  admitted at this time.  She finally has a long-awaited urology appointment  this Friday at 8 a.m. through the [facility] program.  The  patient states that she would come back to the [facility] if she has nausea,  vomiting, fevers, or worsening pain and the patient understands that  admission may be necessary Friday after her urology appointment; however,  at  that time she will be in a better social situation with her daughter to be  able to handle that.   ASSESSMENT AND PLAN:  A 50 year old female with recurrent cystitis and  pyelonephritis as well as two pelvic masses.   1. Follow up with urology.  2. Will call the patient next week to make sure that plan is in place to     address urological issues.  3. Once these are under control we will address the pelvic masses, probably     do an exploratory laparotomy.  4. Prescription for Percocet was given to help the patient deal with back     pain until urology appointment this Friday.  [name], MD    KL/MEDQ  D:  07/01/2003  T:  07/01/2003  Job:  [id number]

## 2011-01-28 NOTE — Discharge Summary (Signed)
[facility]  Patient:    [name], [name]                      MRN: [id number] Adm. Date:  [id number] Disc. Date: [id number] Attending:  Tammi Sou Dictator:   Doren Custard, M.D.                           Discharge Summary  ADDENDUM  DISCHARGE LABORATORY DATA:    Uric acid 8.5, sodium 139, potassium 4.4, chloride 102, bicarbonate 28, glucose 21, BUN 12, creatinine 0.8, total bilirubin 0.1, alkaline phosphatase 127, SGOT 12, SGPT 6, albumin 2.5, calcium 8.9, total protein 5.8.  LDH was 173.  White count 8.9, hemoglobin 8.5, hematocrit 26.3, platelets  383. DD:  12/15/99 TD:  12/15/99 Job: [id number]

## 2011-01-28 NOTE — Discharge Summary (Signed)
[name], [name]                         ACCOUNT NO.:  [id number]   MEDICAL RECORD NO.:  [id number]                   PATIENT TYPE:  INP   LOCATION:  5530                                 FACILITY:  [facility]   PHYSICIAN:  Adonis Housekeeper, M.D.                   DATE OF BIRTH:  01-Jun-1961   DATE OF ADMISSION:  11/15/2002  DATE OF DISCHARGE:  11/17/2002                                 DISCHARGE SUMMARY   DISCHARGE DIAGNOSES:  1. Uncontrolled diabetes, hemoglobin A1C is 8.7 on November 07, 2002.  2. Hypertension.  3. Tobacco abuse.  4. Severe headache.  5. Insomnia with anxiety disorder/panic disorder.  6. Dyslipidemia.  7. History of pseudotumor cerebri.  8. Status post cholecystectomy with a positive endoscopic retrograde     cholangiopancreatography per Dr. Russella Dar.  9. Status post esophagogastroduodenoscopy with Dr. Catha Gosselin and colonoscopy per     Dr. Leone Payor in January of 2004.  10.      Status post tubal ligation 2001.  11.      History of gastroesophageal reflux disease.   DISCHARGE MEDICATIONS:  1. Nortriptyline 25 mg one p.o. q.h.s.  2. Actos 50 mg one p.o. daily.  3. Xanax 0.5 mg one p.o. t.i.d.  4. Aciphex 30 mg one p.o. daily.  5. Zocor 20 mg one p.o. q.h.s.  6. Insulin 70/30 80 units q.a.m. half an hour prior to breakfast and 45     units q.p.m. half an hour prior to dinner.  7. Ambien 10 mg one q.h.s.  8. Percocet 325/5 q.4h. p.r.n. for pain, dispense 30.  9. Try Aleve or ibuprofen, if no results, Percocet will be used.   FOLLOW UP:  She is to follow up with Dr. Mikeal Hawthorne.  Appointment has been made  on December 19, 2002.  She will be informed of the appointment.  At that time,  CBG has to be noted as well as follow-up studies for urine testing in  hospital.   Please see admitting H&P.  The patient is a 50 year old white female with  multiple medical problems as stated in the discharge diagnoses who came in  with uncontrolled diabetes and increased CBG since the end of  January even  though she is compliant with medical therapy.  The patient's CBGs have been  in the range of 400 to 600 which occasionally dropped below 300.  She has  been taking larges doses of insulin.  She denies any missing of doses.  She  also complains of a headache the past one month for which she has been  taking Percocet for relief associated with an amount of stress.  She also  has not been able to sleep without a dose of nortriptyline at a low dose.   ALLERGIES:  No known drug allergies.   PAST MEDICAL HISTORY:  As stated in the discharge diagnoses.   HABITS:  She smokes a half  a pack a day of cigarettes for the past 20 years.   SOCIAL HISTORY:  She is married. 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   FAMILY HISTORY:  Mother has hypertension, diabetes, and father has diabetes  and hypertension.   PHYSICAL EXAMINATION:  GENERAL APPEARANCE:  She is a pleasant individual.  VITAL SIGNS:  Pulse 115, blood pressure 116/66, temperature 98.3,  respiratory rate 20, O2 saturations 98% on room air.  HEENT:  Extraocular movements are intact.  PERRLA.  No visual field cut  grossly as well as no papilledema.  ENT show ears have no significant  tympanic membrane, pearly white.  Nose with no increased size of turbinates.  No erythema.  NECK:  Supple.  No lymphadenopathy, no JVD.  RESPIRATORY:  Clear to auscultation bilaterally.  No wheezes, crackles or  rhonchi.  CARDIOVASCULAR:  Regular rate and rhythm with no murmurs, rubs, or gallops.  ABDOMEN:  Soft, obese and nontender.  Bowel sounds are heard.  EXTREMITIES:  No clubbing, cyanosis, or edema.  SKIN:  No rash.  NEUROLOGIC:  Nonfocal.   LABORATORY DATA:  On the day of admission, sodium 134, potassium 4.0,  chloride 99, bicarb 26, BUN 12, creatinine 1.0, serum glucose 217.  White  blood cell count 9.4, hemoglobin 12.5 and platelets 327, ANC 4.9 and MCV of  81.6%.   Bilirubin 0.4, alkaline phosphatase 110, SGOT 12, SGPT 18, protein  7.1, albumin 3.8, calcium 8.9.  Total cholesterol 287, triglycerides 686,  HDL 38.  TSH value of 2.672.   HOSPITAL COURSE:  PROBLEM #1 -  UNCONTROLLED DIABETES:  Hyperglycemia even  though she takes larges doses of insulin.  The patient was given 70/30 in  hospital and continued to have elevated serum glucoses.  At this date in  time to rule out Cushing's disease, an a.m. Urine cortisol was obtained.  A  TSH was obtained which was normal with hyperglycemia.  The patient was  started on her home dose of 70/30.  Also to exclude the pheochromocytoma, a  24-hour urine, VMA and metanephrines were obtained.  Her serum glucose  levels during her stay were around 300s.  The a.m. cortisol level 9.4, TSH  2.67 and on November 17, 2002, the patient wanted to go home as she had a  daughter at home to take care of.  She was sent home on an increased dose of  insulin that is 70/30 80 units q.a.m. and 45 units q.p.m.  At the same time,  she was told to take about 50 mg of Actos.  She was to call her primary care  physician, Dr. Mikeal Hawthorne, if her serum glucoses were above 400 and this will be  followed up as an outpatient.   PROBLEM #2 -  HEADACHE:  The patient had headache thought to be secondary to  stress as she is taking care of 55-year-old child.  Other causes had to be  ruled out, as Cushing's could be responsible for hyperglycemia and headache,  as well as pheochromocytoma for which studies were obtained.  The urine  cortisol was pending at the time of discharge and a.m. cortisol level was  9.4 which was within normal limits.  His VMA and HVA were also pending at  the time of discharge.  Metanephrines were also pending.  This has to be  followed up as an outpatient by Dr. Mikeal Hawthorne.  It was probably secondary to  blood sugar, stress, decreased sleep.  She was given 10 mg of Percocet on an outpatient basis and after she tried Aleve or  ibuprofen, if not relieved  with those two, then Percocet would have been started.   PROBLEM #3 -  DYSLIPIDEMIA:  The patient's lipid levels and cholesterol  levels were noted to be total cholesterol around 287, triglycerides 686 and  LDH of 38.  She was sent home of 20 mg of  Zocor daily.  This needs to be  followed up as an outpatient.   PROBLEM #4 -  HYPERTENSION:  The patient was on no blood pressure  medications when she was in the hospital.  During hospitalization her blood  pressure remained stable.  This will have to be rechecked on an outpatient  basis and Lisinopril as required to be restarted.   LABORATORY DATA:  On the day of discharge, sodium was 132, potassium 4.1,  chloride 98, bicarb 27, BUN 12, creatinine 1.0, serum glucose 324.  Hemoglobin 11.4, WBC 7, and platelets 238.                                               Adonis Housekeeper, M.D.    TS/MEDQ  D:  11/18/2002  T:  11/19/2002  Job:  [id number]   cc:   Lonia Blood, M.D.  [address] Resident  [place], [place] [address]  Fax: [phone]

## 2011-01-28 NOTE — Op Note (Signed)
[name], [name]               ACCOUNT NO.:  [id number]   MEDICAL RECORD NO.:  [id number]          PATIENT TYPE:  INP   LOCATION:  3732                         FACILITY:  [facility]   PHYSICIAN:  Charlynne Pander, D.D.S.DATE OF BIRTH:  05-10-1961   DATE OF PROCEDURE:  04/27/2006  DATE OF DISCHARGE:                                 OPERATIVE REPORT   PREOPERATIVE DIAGNOSES:  1. Diabetes mellitus - type 2 ---uncontrolled.  2. History of diabetic ketoacidosis.  3. Acute pulpitis.  4. Chronic apical periodontitis.  5. Periapical abscess.  6. Chronic periodontitis.   POSTOPERATIVE DIAGNOSES:  1. Diabetes mellitus - type 2--- uncontrolled.  2. History of diabetic ketoacidosis.  3. Acute pulpitis.  4. Chronic apical periodontitis.  5. Periapical abscess.  6. Chronic periodontitis.  7. Bilateral maxillary hyperplastic tuberosities.   OPERATIONS:  1. Multiple extraction of teeth numbers 3, 4, 5, 6, 7, 8, 9, 10, 11, 12,      13.  2. Two quadrants of alveoloplasty.  3. Maxillary left and maxillary right tuberosity reductions.   SURGEON:  Charlynne Pander, D.D.S.   ASSISTANT:  Ernest Haber (dental student).   ANESTHESIA:  General anesthesia via nasoendotracheal tube.   MEDICATIONS:  1. Clindamycin 300 mg IV per previous protocol.  2. Local anesthesia with a total utilization of four carpules each      containing 36 mg Xylocaine with 0.018 mg of epinephrine.   SPECIMENS:  There were 11 teeth which were discarded.   CULTURES:  None.   DRAINS:  None.   COMPLICATIONS:  None.   ESTIMATED BLOOD LOSS:  100 mL.   FLUIDS:  650 mL lactated Ringer solution.   INDICATIONS:  The patient was admitted with a history of diabetic  ketoacidosis and toothache symptoms.  A dental consultation was requested to  evaluate poor dentition and to rule out dental infection which may affect  the patient's systemic health and be contributing to the uncontrolled  diabetes mellitus.  A dental  consultation was performed and the patient was  examined and treatment planned for multiple extractions with alveoloplasty  of the remaining upper teeth with alveoplasty and preprosthetic surgery as  indicated.  This treatment plan was formulated to decrease the risks and  complications associated with dental infections from an affecting the  patient's systemic health and glycemic control.   OPERATIVE FINDINGS:  The patient was examined in operating room #4.  The  teeth were identified for extraction.  The patient was noted to be affected  by a history of acute pulpitis, chronic apical periodontitis, periapical  abscess, chronic periodontitis, and multiple retained root segments.  The  aforementioned necessitated the removal of all remaining maxillary teeth  with alveoplasty and preprosthetic surgery as indicated.   DESCRIPTION OF PROCEDURE:  The patient was brought to the main operating  room #4.  The patient was then placed in supine position on the operating  room table.  General anesthesia was induced via nasoendotracheal tube.  The  patient was then prepped and draped in the usual manner for dental medicine  procedure.  A throat pack  was placed at this time.  The oral cavity was  thoroughly examined with the findings as noted above.  The patient was then  ready for the dental medicine procedure as follows:   Local anesthesia was administered sequentially over the one hour long  procedure with a total utilization of four carpules each containing 36 mg  Xylocaine with 0.018 mg of epinephrine.   The maxillary right quadrant was first approached.  Anesthesia was delivered  as previously described.  A 15 blade incision was made from the distal of  the maxillary right tuberosity and extended to the mesial of #11.  A  surgical flap was then carefully reflected.  Appropriate amounts of buccal  and interseptal bone were removed appropriately.  The maxillary right teeth  were then  subluxated with a series of straight elevators.  Tooth number 3  was then removed with a 53 forceps without complications.  Tooth numbers 4,  5, 6, 7, and 8 were then removed with a 150 forceps without complications.  Alveoplasty was then performed utilizing rongeurs and bone file.  A  maxillary right tuberosity reduction was then achieved utilizing a 15 blade  and soft tissue pickups.  The tissues were approximated and trimmed  appropriately.  The surgical site was then irrigated with copious amounts  sterile saline.  The surgical site was then closed from the maxillary right  tuberosity and extended to the mesial of number 8 utilizing 3-0 chromic gut  suture in a continuous interrupted suture technique x1.   The maxillary left quadrant was then approached.  Anesthesia delivered as  previously described.  A 15 blade incision was made from the maxillary left  tuberosity and extended to the mesial of number 8.  A surgical flap was then  carefully reflected.  Appropriate amounts of buccal and interseptal bone  were removed at this time.  Tooth numbers 9, 10, 11, 12 and 13 were then  subluxated with a series of straight elevators.  These teeth were then  removed with the 150 forceps without further complications.  Alveoplasty was  then performed utilizing rongeurs and bone file.  At this point in time, the  buccal exostosis in the area of #15 was removed with rongeurs and bone file  appropriately.  A maxillary left tuberosity reduction was then achieved  utilizing soft tissue pickups and a 15 blade.  The tissues were approximated  and trimmed appropriately.  The surgical site was then irrigated with  copious amounts sterile saline x2.  The surgical site was then closed from  the maxillary left tuberosity and extended to the mesial of number 9  utilizing 3-0 chromic gut suture in a continuous interrupted suture  technique x1.  At this point in time, the entire mouth was irrigated with copious  amounts  of sterile saline.  The patient was examined for complications, seeing none,  the dental medicine procedure was deemed to be complete.  The throat pack  was removed at this time.  A series of 4x4 gauze were placed in the mouth to  aid in hemostasis.  The patient was then handed over to the anesthesia team  for final disposition.  After the appropriate amount of time, the patient  was extubated and taken to the post anesthesia care unit with stable vital  signs and good oxygenation level.  All counts were correct for the dental  medicine procedure.  The patient will be given appropriate pain medication  and will be continued on IV antibiotic  therapy and then switch to p.o.  antibiotic therapy as per Dr. Corky Downs.  The patient will be seen in  approximately one week for evaluation for suture removal.  The patient will  then follow-up with a dentist of her choice for fabrication of upper  complete denture and lower partial denture as indicated.      Charlynne Pander, D.D.S.  Electronically Signed     RFK/MEDQ  D:  04/27/2006  T:  04/27/2006  Job:  [id number]   cc:   Mobolaji B. Corky Downs, M.D.

## 2011-01-28 NOTE — Op Note (Signed)
[name], [name]               ACCOUNT NO.:  [id number]   MEDICAL RECORD NO.:  [id number]          PATIENT TYPE:  AMB   LOCATION:  DSC                          FACILITY:  [facility]   PHYSICIAN:  Lebron Conners, M.D.   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   OPERATION:  Right breast biopsy.   SURGEON:  Zigmund Daniel, M.D.   ANESTHESIA:  General and local.   COMPLICATIONS:  None.   ESTIMATED BLOOD LOSS:  Minimal.   CONDITION TO PACU:  Good.   PROCEDURE:  After the patient was monitored and anesthetized with general  anesthesia and had routine preparation and draping of the right breast, I  tried to express fluid from the ducts of the right breast.  I found a milky,  somewhat cloudy discharge from more than one duct, the largest of which  appeared to be at about the 10 o'clock position.  The area in which the  ductal papilloma had been possibly identified was at the inferior aspect of  the nipple.  I tried cannulating ducts with a tear duct probe and found that  they were not very big and I could not get it to go in past just below the  nipple.  I made a decision to excise all the subareolar ducts.  I made an  incision in the inferior aspect of the areola and dissected up just under  the areola leaving a little bit of fat attached to the areola until  I came  up to the ductal tissue and I transected that, leaving a very small button  of ductal tissue just beneath the areola and then dissected on cephalad for  2-3 cm.  I then excised a generous button of tissue from beneath the areola,  excising all the central ducts.  I then got good hemostasis with the  cautery.  I filled the cavity with some long acting local anesthetic and  anesthetized the skin  and the subcutaneous tissues with the same local  anesthetic.  I closed the skin with a running intracuticular Vicryl suture  reinforced by Steri-Strips.  The patient tolerated the operation well.      Lebron Conners, M.D.  Electronically Signed     WB/MEDQ  D:  07/05/2005  T:  07/05/2005  Job:  [id number]

## 2011-01-28 NOTE — Procedures (Signed)
[name], [name]               ACCOUNT NO.:  [id number]   MEDICAL RECORD NO.:  [id number]          PATIENT TYPE:  OUT   LOCATION:  SLEEP CENTER                 FACILITY:  [facility]   PHYSICIAN:  Clinton D. Maple Hudson, M.D. DATE OF BIRTH:  1960/12/11   DATE OF STUDY:  12/11/2005                              NOCTURNAL POLYSOMNOGRAM   REFERRING PHYSICIAN:  Dr. Nanetta Batty.   INDICATIONS FOR STUDY:  Insomnia with sleep apnea.  Epworth sleepiness score  02/24, BMI 31.7.  Weight 186 pounds.   HOME MEDICATIONS:  Diovan HCT, atenolol, lisinopril, clonidine, Caduet.   SLEEP ARCHITECTURE:  Total sleep time 411 minutes with sleep efficiency 96%.  Stage I was 3%, stage II 85%, stages III and IV 2%, REM 10% of total sleep  time.  Sleep latency 1 minute, REM latency 127 minutes, awake after sleep  onset 15 minutes, arousal index increased at 36.9 per hour indicating  fragmentation.  No bedtime medication was taken.   RESPIRATORY DATA:  Apnea/hypopnea index (HPI, RDI 15.6 per hour indicating  mild to moderate obstructive sleep apnea/hypopnea syndrome.  There were 44  obstructive apneas and 63 hypopneas.  Events were not positional, recorded  significantly while on right side and supine.  REM AHI 62.3 per hour.  There  were insufficient early events to permit use of CPAP titration by split  protocol on the study night.   OXYGEN DATA:  Moderate snoring with oxygen desaturation to a nadir of 81%.  Mean oxygen saturation through the study was 97% on room air.   CARDIAC DATA:  Normal sinus rhythm.   MOVEMENT/PARASOMNIA:  A total of 157 limb jerks were recorded of which 34  were associated with arousal or awakening for a periodic limb movement with  arousal index of five per hour which is increased.   IMPRESSION/RECOMMENDATIONS:  1.  Sleep architecture was remarkable only for relatively reduced time spent      in REM.  Overall sleep quality was much better than the patient      describes for her  self at home with no sleep medication reported on the      study night.  2.  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  3.  Consider return for CPAP titration or evaluate for alternative therapies      as appropriate.  4.  Periodic limb movement with arousal, five per hour.      Clinton D. Maple Hudson, M.D.  Diplomate, Biomedical engineer of Sleep Medicine  Electronically Signed     CDY/MEDQ  D:  12/18/2005 11:15:19  T:  12/19/2005 06:40:57  Job:  [id number]

## 2011-01-28 NOTE — Consult Note (Signed)
[name]               ACCOUNT NO.:  [id number]   MEDICAL RECORD NO.:  [id number]          PATIENT TYPE:  INP   LOCATION:  3732                         FACILITY:  [facility]   PHYSICIAN:  Charlynne Pander, D.D.S.DATE OF BIRTH:  1961-03-24   DATE OF CONSULTATION:  04/26/2006  DATE OF DISCHARGE:                                   CONSULTATION   [name] is a 50 year old female referred by Dr. Corky Downs for dental  consultation.  Patient with a history of diabetic ketoacidosis and recent  toothache.  Dental consultation requested to rule out dental infection which  may affect the patient's systemic health and be leading to problems with her  diabetes.   MEDICAL HISTORY:  1. Diabetes mellitus with a history of diabetic ketoacidosis and reason      for this admission.  2. Hypertension.  3. Peripheral vascular disease status post right common iliac artery PTA      with stenting in November2006.  4. Coronary disease status post cardiac catheterization on November 24, 2005,      with a circumflex PCI and Cypher drug-eluting stent placed by Dr.      York Ram. The patient has been on Plavix therapy since then.  5. Status post C-section.  6. Hyperlipidemia.  7. Status post cholecystectomy.  8. Status post hysterectomy.  9. History of anxiety disorder.  10.History of gastroesophageal reflux disease.  11.History of obstructive sleep apnea.   ALLERGIES AND ADVERSE DRUG REACTIONS:  1. IBUPROFEN causing nausea and vomiting  2. AMOXICILLIN WITH CLAVULANIC ACID causes nausea and vomiting.  3. PENICILLIN  4. CONTRAST MEDIA DYE causes vomiting.   MEDICATIONS:  1. Atenolol 100 mg twice daily.  2. Clindamycin 300 mg IV every 6 hours.  3. Clonidine 0.3 mg twice daily.  4. Plavix currently being withheld.  The patient indicates that she has      not had this medication for 7-10 days.  5. Lovenox 40 mg every 24 hours.  6. Hydrochlorothiazide 25 mg daily.  7. NovoLog insulin per  sliding scale.  8. Lantus insulin 20 units daily and 15 units at bedtime.  9. Lisinopril 20 mg daily.  10.Nicotine patch daily.  11.Protonix 40 mg daily  12.Diovan 160 mg twice daily.  13.Vancomycin  14.Effexor 225 mg daily.   SOCIAL HISTORY:  The patient is married.  The patient is a Education administrator.  The  patient has two children, both of which are alive and well.  The patient is  a nondrinker.  Married.  The patient smokes approximately one-half pack per  day with a history of 30 years of smoking.Marland Kitchen   FAMILY HISTORY:  Mother with history of coronary disease and diabetes  mellitus.  Unknown health history of the father.   FUNCTIONAL ASSESSMENT:  The patient was independent for ADLs prior to this  admission.   REVIEW OF SYSTEMS:  This was reviewed on the chart and Health History  Assessment form for this admission.   DENTAL EXAM.:  GENERAL:  The patient is a well-developed, nourished female  in no acute no acute distress.  VITAL SIGNS:  Blood pressure is 101/67, pulse 63, respirations 18,  temperature  98.5.  NECK: There is some mild submandibular lymphadenopathy bilaterally.  The  patient denies acute TMJ symptoms.  INTRAORAL EXAM: The patient has normal saliva.  There is no real evidence of  abscess formation within the mouth.  DENTITION.  Patient with multiple missing teeth.  Patient with multiple  retained root segments.  ENDODONTIC:  Patient with a history of acute pulpitis symptoms currently  10/10 intensity.  There appears to be periapical pathology involving tooth  numbers five and six and possibly 12 and 13 at this time.  CROWN OR BRIDGE:  There are no crown or bridge restorations.  DENTAL CARIES:  There are dental caries noted at this time.  PROSTHODONTIC:  The patient denies presence of dentures.  OCCLUSION: Patient with a poor occlusal scheme secondary to multiple missing  teeth, supereruption and drifting of the unopposed teeth into the edentulous  areas and lack of  replacement of missing teeth with dental prosthesis.   RADIOGRAPHIC INTERPRETATION:  Panoramic x-ray was taken by the department of  radiology.   There are multiple missing teeth.  There are multiple retained root  segments.  There is apparent periapical radiolucency associated with tooth  numbers 5 and 6.  There is a poor occlusal scheme noted.  There are dental  caries noted.   ASSESSMENT:  1. History of acute pulpitis symptoms and one of the reasons for this      admission.  The patient indicates that the pain has been present for      the past week or so.  This has been worse as of late.  The patient last      seen approximately 2003 when dental medicine performed multiple      posterior extractions in the lower arch at that time.  The patient has      not sought any other dental care since then.  The patient is now      interested in having all remaining upper teeth extracted.  2. Chronic periodontitis.  3. Plaque calculus accumulations.  4. Generalized gingival recession.  5. Generalized tooth mobility.  6. Multiple missing teeth.  7. Multiple areas of retained roots.  8. Poor occlusal scheme.  9. Multiple areas of periapical pathology.  10.History of Plavix therapy with a risk for bleeding.  The patient      indicates that she has not been compliant with the Plavix therapy and,      therefore, has not taken the medication for 7-10 days.  The patient has      only had one dose during this admission and should be acceptable for      extractions tomorrow.   PLAN/RECOMMENDATIONS:  1. I discussed the risks, benefits, and complications of various treatment      options with the patient in relationship to her medical and dental      conditions.  We discussed various treatment options to include no      treatment, multiple extractions with alveoplasty, pre-prosthetic     surgery as indicated, periodontal therapy, dental restorations, root      canal therapy, crown or bridge  therapy, implant therapy, and replacing      the missing teeth as indicated.  The patient currently wishes proceed      with extraction of all remaining upper teeth with alveoloplasty and pre-      prosthetic surgery as indicated.  This will take place in the operating  room tomorrow at 1:00 p.m.  The Lovenox therapy and Plavix therapy will      be discontinued at this time.  The patient will be continued on      clindamycin IV antibiotic therapy.  The patient will then follow up      with a general dentist of her choice after adequate healing for      fabrication of upper complete denture and lower cast partial denture.  2. Discussion of findings with Dr. Corky Downs as indicated to determine the      medical ability and stability to proceed at this time.      Charlynne Pander, D.D.S.  Electronically Signed     RFK/MEDQ  D:  04/26/2006  T:  04/26/2006  Job:  [id number]   cc:   Mobolaji B. Corky Downs, M.D.

## 2011-01-28 NOTE — Discharge Summary (Signed)
[facility]  Patient:    [name], [name]                      MRN: [id number] Adm. Date:  [id number] Disc. Date: [id number] Attending:  Meredith Leeds CC:         Venita Lick. Pleas Koch., M.D. [facility]  Dario Guardian, M.D.   Discharge Summary  HISTORY OF PRESENT ILLNESS:  The patient is a 50 year old white female diabetic who was admitted to the hospital by Dr. Russella Dar with jaundice, gallstones, nausea, vomiting, and abdominal pain.  The patient had hyperbilirubinemia.  She did not have fever or chills.  See history and physical for further details.  PHYSICAL EXAMINATION:  Remarkable for moderate obesity and for normal vital signs and for tenderness in the epigastrium and the right upper quadrant.  See the history and physical for further details.  HOSPITAL COURSE:  On the day of admission Dr. Russella Dar performed ERCP.  He performed a sphincterotomy.  The common duct was clear.  The patient did well after that and developed no sign of pancreatitis.  The next day she underwent laparoscopic cholecystectomy which was uneventful.  The next morning she felt well and requested discharge.  Liver tests had returned toward normal.  No surgical complications were felt to have occurred.  The patient was sent home and asked to return to see me in three to four weeks or as necessary.  DIAGNOSIS:  Cholelithiasis and choledocholithiasis.  OPERATIONS:  Laparoscopic cholecystectomy.  PROCEDURES:  ERCP with sphincterotomy.  CONDITION ON DISCHARGE:  Improved. DD:  11/15/00 TD:  11/15/00 Job: [id number]

## 2011-01-28 NOTE — Discharge Summary (Signed)
[name], [name]                         ACCOUNT NO.:  [id number]   MEDICAL RECORD NO.:  [id number]                   PATIENT TYPE:  INP   LOCATION:  5530                                 FACILITY:  [facility]   PHYSICIAN:  Oretha Ellis, M.D.                DATE OF BIRTH:  [birth year]   DATE OF ADMISSION:  05/31/2002  DATE OF DISCHARGE:  06/05/2002                                 DISCHARGE SUMMARY   DISCHARGE DIAGNOSES:  1. Likely abscess, postmandibular tooth. Status post surgical extraction of     teeth numbers 18, 19, 28 and 29. Bilateral mandibular tori reductions and     post debridement of teeth.  2. Type 2 diabetes mellitus, diagnosed in 2001.  3. Klebsiella pneumoniae urinary tract infection, diagnosed May 31, 2002.  4. Iron deficiency anemia.  5. Hypertension.   DISCHARGE MEDICATIONS:  1. Clindamycin 300 mg 1 tablet t.i.d. x 4 days.  2. Humalin70/30, inject 70 units q. a.m.  and 4 units q. p.m.  3. Actos 50 mg 1 p.o. q.d. with food.  4. Lisinopril 10 mg 1 p.o. q.d.  5. Percocet 5/325 1 to 2 tablets p.o. q.4-6h. p.r.n. pain.   HOSPITAL COURSE:  The patient is a 50 year old female with a 10 day history  of increasing blood sugars. She was diagnosed with Klebsiella pneumoniae UTI  on May 27, 2002. Beyond treatment with Tequin, her sugars kept  increasing. Of note for the last year she had had a toothache on the left  side and had not seen a dentist for financial reasons. She now reports  exquisite pain in her jaw. No nausea or vomiting or diarrhea. She says that  she feels a little bit  dehydrated and has been unable to eat because of the pain.   PHYSICAL EXAMINATION:  VITAL SIGNS:  Pulse 120, blood pressure 156/98.  HEENT:  Significant for multiple carious teeth.  NECK:  Erythematous, questionable swelling, but not warm. Exquisitely tender  to palpation on the left side but not the right.   CONSULTS:  Dr. Charlynne Pander, D.D.S., dental.   PROCEDURES:  The patient underwent oral surgery on June 04, 2002.   HOSPITAL COURSE BY PROBLEM:  PROBLEM #1, ABSCESS TOOTH:  The patient was  begun on Clindamycin for an odontogenic infection. She was evaluated by Dr.  Kristin [name] and he felt that surgery was the best option. She underwent the  surgery on June 04, 2002. She tolerated it very well. Her diet was  advanced to clears, then regular, and her pain was controlled at first with  morphine and then eventually with Percocet. She is discharged to home in  stable condition from a tooth standpoint.   PROBLEM #2, TYPE 2 DIABETES MELLITUS:  The patient's blood sugars were  elevated  upon admission. She was treated with hydration, insulin, and her  blood sugars trended down into the  low 100s.  She was sent out on her home  dose of insulin. Hopefully with the resolution of both her urinary tract  infection and her tooth abscess, she will be able to regain the excellent  control of the sugars that she had prior to  admission. The patient is to  expect to receive a telephone call from the outpatient diabetes education  center. Their phone number is [phone].   PROBLEM #3, KLEBSIELLA PNEUMONIAE URINARY TRACT INFECTION:  This was treated  with a five day course of Tequin. The patient was afebrile. Her WBCs were  normal upon discharge. Having  received an adequate course of antibiotics,  she is being sent home without any antibiotics specific for the Klebsiella  pneumoniae UTI, and will be followed up in an outpatient setting.   PROBLEM #4, IRON DEFICIENCY ANEMIA:  The patient's hemoglobin was 11.8 on  admission. It decreased to 10.0 on June 03, 2002.  It recovered to 11.1  on June 04, 2002. On discharge it was 10.1. She is still a menstruating  female and underwent this oral surgery with some bleeding. The patient has  been worked up before for anemia, and was shown again to be iron deficient  during this hospitalization. We  will begin iron supplementation in the  outpatient setting.   PROBLEM #5, HYPERTENSION:  The patient was on hydrochlorothiazide 25 mg p.o.  q.d. throughout her hospitalization and her blood pressure remained under  very good control. We will continue this medication in the outpatient  setting.   FOLLOW UP:  The patient will see Dr. Kristin [name] on June 11, 2002, for  suture removal in the dental clinic, telephone number [phone]. She will see  Dr. Oretha Ellis in [facility] outpatient clinic on June 20, 2002, at 1:30 p.m., phone number [phone].   DISCHARGE LABORATORY DATA:  1. 4.0, 106, 23, 10, 0.9, 183. Hemoglobin 10.1, hematocrit 30.4, WBC 6.9,     platelets 273.                                               Oretha Ellis, M.D.    BT/MEDQ  D:  06/05/2002  T:  06/08/2002  Job:  [id number]   cc:   Charlynne Pander, D.D.S.

## 2011-01-28 NOTE — H&P (Signed)
[facility]  Patient:    [name], [name]                      MRN: [id number] Adm. Date:  [id number] Disc. Date: [id number] Attending:  Shelba Flake                         History and Physical  DATE OF BIRTH:  Oct 09, 1960  CHIEF COMPLAINT:  Cellulitis left foot.  HISTORY OF PRESENT ILLNESS:  Ms. Puccini is a 50 year old Caucasian female with a history of uncontrolled diabetes, uncontrolled hypertension, and recent pregnancy with C-section delivery who reports to the [facility] Emergency Room for evaluation of pain in the left foot. She was originally evaluated on the evening of May 25, 2000. At which time, a preliminary diagnosis of cellulitis was made, and the patient was dosed with 1 g of Rocephin IV. She was asked to return today. On return evaluation, it was felt that the patients cellulitis had indeed worsened despite the Rocephin. In fact, the patient did report increase pain and significant increase in edema of the left foot. This was significant enough that she could not ambulate on the foot without intense pain. The patient denies trauma to the foot, recent insect bites, scratches, or athletes foot. She states that she has never had similar difficulties. She does state that during her hospitalization for her pregnancy she encountered no significant complications and was not bedridden more than the expected length of stay with a C-section. Of note, this delivery was December 11, 1999. She has not had any significant shortness of breath, and there has been no swelling in the calves. The symptoms are completely limited to the ankle and below with the erythema on the dorsum of the foot per the patients history. She states symptoms began on Monday of this week approximately 5 days ago. At which time, she most notably had cramping; and then on Tuesday, she did notice erythema developing in the area. All symptoms progressed over the week to  manifest today at the highest level of pain. She denies fever, chills, nausea, vomiting, or shortness of breath. There has been no diarrhea.  PAST MEDICAL HISTORY: 1. Hypertension x 67 years poorly controlled secondary to poor compliance. 2. Type 2 diabetes beginning as gestational diabetes approximately 1 1/2 years    ago. 3. Status post C-section December 11, 1999, for difficult delivery. 4. No other surgical history and no prior hospital admissions.  ALLERGIES:  No known drug allergies.  CURRENT MEDICATIONS:  (Per patient) 1. Cozaar 25 mg q.d. 2. Insulin 70/30 30 units "as needed" for "high sugar." 3. Office records also suggest patient is supposed to be on HCTZ 12.5 mg q.d.,    Norvasc 10 mg q.d., and Actos 30 mg q.a.m.  FAMILY HISTORY:  The patients father is alive with no medical problems. The patients mother has a significant history of myocardial infarction at age 68 and diet-controlled type 2 diabetes, and she has a maternal grandfather who also has diabetes.  SOCIAL HISTORY:  The patient stays at home with her children. She is married. She smokes a half of a pack a day and has for 23 years. She denies alcohol use or illicit drugs.  REVIEW OF SYSTEMS:  ENDOCRINE:  The patient does report hot flashes, two to three times per week and intolerance to heat, noting that she adjusts the thermostat on multiple episodes when other family  members are perfectly comfortable, but she denies weight change, although she does acknowledge mood swings. LUNGS:  The patient denies cough or hemoptysis or shortness of breath. CARDIOVASCULAR:  The patient denies chest pain, palpitations, or shortness of breath. ABDOMEN:  The patient denies abdominal pain or cramps, change in bowel movements, constipation, or diarrhea. EXTREMITIES:  The patient denies edema, loss of strength or sensation, except as per HPI. GU:  The patient denies dysuria, vaginal discharge, or pelvic cramping. PSYCHIATRIC:  The  patient denies depression or manic behavior. NEUROLOGICAL:  The patient is alert and oriented x 4 and denies confusion. HEENT:  The patient denies acute visual change, acute hearing change, sore throat, cough, or runny nose.  PHYSICAL EXAMINATION:  VITAL SIGNS:  Blood pressure 165/104, heart rate 116, respiratory rate 20, temperature 97.3, CBG 116.  HEENT:  Normocephalic, atraumatic. Pupils equal, round, and reactive to light and accommodation. Extraocular muscles intact bilaterally. Hearing grossly intact bilaterally.  NECK:  No lymphadenopathy or thyromegaly.  LUNGS:  Clear to auscultation bilaterally without wheezes or rhonchi.  CARDIOVASCULAR:  Regular rate and rhythm without murmur, gallop, or rub with normal S1 and S2.  ABDOMEN:  Obese, nontender, nondistended, soft. Bowel sounds present. No hepatosplenomegaly.  EXTREMITIES:  Left lower extremity with erythema across the dorsum and lateral aspect of the foot with associated 2+ edema to the ankle but no streaking extending onto the left lower leg/shin. Right foot with no edema or erythema and 2+ dorsalis pedis pulse and no erythema or edema of the left shin or lower leg.  CUTANEOUS:  No obvious breaks over erythematous area of left foot and no suggestion of insect bites and no tinea of the area between the toes of the left or right foot.  ADMISSION LABORATORY DATA:  From May 25, 2000:  Hemoglobin 12, platelet 356, white count 9.3, MCV 80.4. Sodium 138, potassium 4.9, chloride 105, CO2 25, BUN 14, creatinine 0.9, glucose 135, calcium 9.5.  IMPRESSION AND PLAN: 1. Cellulitis of the left foot. Etiology is unclear at this time but is    likely due to common cutaneous organisms. If the patient is a poorly    controlled diabetic, however, I feel that is empiric to cover widely as    recommended in the [place] Guide to Antimicrobial Therapy. As a result, I    am placing the patient on Primaxin 500 mg IV q.6h. to assure  that she    receives adequate coverage. She is also being placed on oxycodone 5 mg one    to two p.o. q.6h. p.r.n. pain for associated symptoms. She is being     encouraged to elevate the foot while in bed. We will conduct serial exams    to assure the patients cellulitis is resolving. Additionally, I will get a    plain film x-ray of the left foot to assure there is no evidence of    osteomyelitis or gas within the affected area. I am also checking two blood    cultures to assure that there is no evidence of bacteremia at this time. 2. Uncontrolled diabetes. We will follow CBGs q.a.c. and q.h.s., and I will    dose with sliding scale insulin as needed. During hospitalization, we will    educate the patient on her diabetic care, and we will refer her for    outpatient teaching at her discharge. As the patient was unable to tolerate    an ACE inhibitor secondary to cough, we will continue the angiotensin  receptor blocker as done by her primary care physician. 3. Uncontrolled hypertension. The patient is noncompliant with her medical    regimen. In fact, she told me that she was on Cozaar alone, but her medical    records from her primary care doctor suggests that she should be on    Norvasc, hydrochlorothiazide, and Cozaar. I will return her to the    hydrochlorothiazide at a dose of 25 mg q.d. and also dose with Cozaar with    25 mg q.d. We will follow blood pressure closely during her hospitalization    and educate the patient on importance for the compliance and the long-term    damage that uncontrolled hypertension can bring about. 4. Mood swings and hot/cold intolerance per review of systems. I will check a    TSH during this hospitalization to assure that the patient is not suffering    from symptoms of hypothyroidism to explain her symptoms. Otherwise, this    may be related to the chief complaint of the cellulitis.DD:  05/26/00 TD:  05/26/00 Job: [id number]

## 2011-01-28 NOTE — Group Therapy Note (Signed)
NAMEJONTAE, [name] NO.:  [id number]   MEDICAL RECORD NO.:  [id number]          PATIENT TYPE:  WOC   LOCATION:  [facility]                   FACILITY:  [facility]   PHYSICIAN:  Argentina Donovan, MD        DATE OF BIRTH:  08-May-1961   DATE OF SERVICE:  05/10/2005                                    CLINIC NOTE   HISTORY OF PRESENT ILLNESS:  The patient is a 50 year old female well known  to the clinic. She is status post a total abdominal hysterectomy and  bilateral salpingo-oophorectomy in February 2005 who presents with multiple  complaints. She has had trouble with her right breast leaking fluid for the  past year. Her last mammogram was in November 2005 and was a BIRADS 2 and  she reports that she had ultrasound that was negative. The patient reports  that her breast continues to be very tender, mainly around the nipple and on  both sides. She has been treated for mastitis in the past. Reports that she  does not feel any specific nodule currently.   Postmenopausal symptoms:  The patient reports significant hot sweats and  mood swings that were not helped by her being on Premarin so that was  discontinued. She is having difficulty functioning in her daily life.  Reports being extremely fatigued, in part because she is unable to sleep.  She endorses difficulty doing her housework. Endorses poor sex drive. She  says that her husband is very concerned about her and is getting frustrated.   FAMILY HISTORY:  A maternal cousin had breast cancer in her late 23s.  Maternal grandmother died of cancer but it was an unknown type.   PHYSICAL EXAMINATION:  VITAL SIGNS:  Temperature 97.7, blood pressure  131/79, pulse 78. Height 5 feet 5 inches, weight 192.2.  GENERAL:  Well-developed, well-nourished female in no apparent distress.  NECK:  Supple, no thyromegaly appreciated.  CARDIOVASCULAR:  Regular rate and rhythm.  LUNGS:  Clear to auscultation bilaterally.  BREASTS:  Exam  revealed that the right breast is slightly larger than her  left breast. No retractions or skin lesions, no distinct nodules. However,  she is very tender to palpation diffusely on the right side. No nipple  discharge was obtained.   IMPRESSION:  1.  Breast pain/nipple discharge.  2.  Menopausal symptoms.  3.  Fatigue.   PLAN:  1.  I discussed with Dr. Okey Dupre. Will refer the patient to a general surgeon      and get a ductogram to further evaluate her breast concerns.  2.  Check a TSH and prolactin level.  3.  Start Wellbutrin SR 150 mg one tablet daily. This has been shown to help      with sexual desire and it may help      her hot flashes. She is also endorsing signs of depression. If this dose      is not effective, can always increase it or consider changing to Paxil.     ______________________________  Carolanne Grumbling, M.D.    ______________________________  Argentina Donovan, MD    TW/MEDQ  D:  05/10/2005  T:  05/10/2005  Job:  [id number]

## 2011-01-28 NOTE — Cardiovascular Report (Signed)
[name], [name]               ACCOUNT NO.:  [id number]   MEDICAL RECORD NO.:  [id number]          PATIENT TYPE:  OIB   LOCATION:  6523                         FACILITY:  [facility]   PHYSICIAN:  Nanetta Batty, M.D.   DATE OF BIRTH:  [birth year]   DATE OF PROCEDURE:  11/24/2005  DATE OF DISCHARGE:  11/25/2005                              CARDIAC CATHETERIZATION   Ms. 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  She has normal  renal arteries and SFAs with three vessel run off.  She has complained of  three episodes of chest pain which had, typically anginal.  She does  continue to smoke 3/4 pack cigarettes a day and has a history of diabetes  and uncontrolled hypertension.  She presents now for outpatient diagnostic  coronary arteriography to define her anatomy and to rule out ischemic  etiology.   DESCRIPTION OF PROCEDURE:  The patient was brought to the sixth floor [facility] in the postabsorptive state.  She  was premedicated with p.o. Valium, IV morphine, and Versed.  Her right groin  was prepped and shaved in the usual sterile fashion.  1% Xylocaine was used  for local anesthesia.  A 6 French sheath was inserted into the right femoral  artery using standard Seldinger technique.  A 6 French right and left  Judkins diagnostic catheters along with 6 French pigtail catheter were used  for selective coronary angiography, left ventriculography, subselective left  internal mammary artery angiography, and distal abdominal aortography.  Visipaque dye was used for the entirety of the case.  Retrograde aorta, left  ventricular, and pull back pressures were recorded.   HEMODYNAMICS:  1.  Aortic systolic pressure 161, diastolic pressure 96.  2.  Left ventricular systolic pressure 160 and diastolic pressure 17.   SELECTIVE CORONARY ANGIOGRAPHY:  1.  Left main  normal.  2.  LAD normal.  3.  Left circumflex had 30-40% ostial followed by 90% mid after first OM.  4.  Right coronary artery is a small but dominant vessel with 50-70% mid as      well as distal stenosis.  5.  Left ventriculography:  RAO left ventriculogram was performed using 25      mL of Visipaque dye at 12 mL per second.  The overall LVEF is estimated      at greater than 70% with cavity obliteration.  6.  Left internal mammary successfully subselectively visualized and was      widely patent.  Suitable for use during coronary artery bypass grafting.  7.  Distal abdominal aortography:  Distal abdominal aortogram was performed      using 25 mL of Visipaque dye at 20 mL per second.  The renal arteries      were widely patent.  The infrarenal abdominal aorta had mild      atherosclerotic changes.  The iliac bifurcation was normal with a widely      patent proximal right common iliac artery stent.   IMPRESSION:  High grade mid circumflex with moderate RCA disease.   PLAN:  Circumflex PCI and stenting using drug eluting stent and Angiomax.   PROCEDURE DESCRIPTION:  The patient was on aspirin and Plavix and received  an additional 150 mg p.o. Plavix.  The 6 French sheath in the right femoral  artery was exchanged over a wire for a 7 [place] sheath.  A 6 French sheath  was then inserted into the right femoral vein.  The patient received  Angiomax bolus with an ACT of 277.  Visipaque dye was used for the entirety  of the intervention.  Retrograde aortic pressure was monitored during the  case.  The patient received 200 mcg of intracoronary nitroglycerin twice  during the case.   Using a 7 [place] guide catheter along with an O14, 190 Asahi soft wire,  25 x 12 Maverick, PCI was performed on the mid circumflex.  Following this,  a 3 x 13 Cypher drug eluting stent was then deployed under careful  angiographic and fluoroscopic control at 14 atmospheres (3.11 mm) resulting  in reduction  of a 90% mid circumflex stenosis to 0% residual with excellent  flow and no dissection.  The patient tolerated the procedure well.  The  guide-wire and catheter were removed.  The sheaths were sewn securely in  place.  The patient left the lab in stable condition.   IMPRESSION:  Successful mid circumflex PCI and stenting using Cypher drug  eluting stent and Angiomax.  The patient has moderate residual RCA disease  in the mid and distal portion which will be treated medically.  The patient  will be hydrated overnight and discharged home in the morning.  She will be  treated with aspirin and Plavix and will see me back in the office in  approximately 1-2 weeks for follow up.  She left the lab in stable  condition.      Nanetta Batty, M.D.  Electronically Signed     JB/MEDQ  D:  11/24/2005  T:  11/26/2005  Job:  [id number]   cc:   Ernestina Penna, M.D.  Fax: [phone]

## 2011-01-28 NOTE — Group Therapy Note (Signed)
NAME:  [name], HODAK NO.:  [id number]   MEDICAL RECORD NO.:  [id number]                   PATIENT TYPE:  OUT   LOCATION:  [facility]                           FACILITY:  [facility]   PHYSICIAN:  Argentina Donovan, MD                     DATE OF BIRTH:  1960/10/25   DATE OF SERVICE:  06/24/2003                                    CLINIC NOTE   SUMMARY:  The patient is a 50 year old para 2-0-0-2 female who presents for  follow-up from last week's visit, as noted in the previous clinic note.  The  patient has been having fevers, abdominal pain for five weeks.  The patient  had been treated for pyelonephritis at [facility] by Dr. Mikeal Hawthorne and had a CT that  had an incidental finding of a left ovary with a complex mass and a mass in  the cul-de-sac measuring 5.4 x 3.3 x 5.8 that did not appear to enhance like  an abscess.  The patient was taken for follow-up of the pelvic masses.  Incidentally, the patient had been having fevers the past week and had not  been back to the primary care [name].  Last week endometrial biopsy was  done which was normal, no signs of infection or atypia.  Pap smear was  normal except for signs of fungus.  Chest x-ray was done; however, this  result was not available and we are working on finding this film.  Urine  culture was done that showed greater than 100,000 of Klebsiella which was  sensitive to Cipro.  The patient was called in a prescription for Cipro.  However, the patient has still been having fevers, the highest being 101  yesterday evening.  The patient still complains of dysuria today and lower  suprapubic pain.  At this point I paged Dr. Mikeal Hawthorne and we spoke at length  today about the patient.  We both concluded that she needs a urology  referral.  She is part of the [facility] network and we will be  referring her through this to see a urologist to evaluate the urinary system  in detail.  We will also repeat the CBC and do an  ESR today and a  transvaginal ultrasound was reordered to evaluate the pelvic masses to see  if they have changed in size.  The patient was changed to Macrodantin 100 mg  p.o. q.i.d. for one week and then 100 mg p.o. daily.  A urine culture was  also sent today.  The patient is to continue to taking her temperature three  times a day and record results.  The patient is to follow up with Dr. Mikeal Hawthorne  next week and with me next week as well to evaluate progress in getting a  urology appointment and to see how fever curve is doing.     [name], MD  Argentina Donovan, MD    KL/MEDQ  D:  06/24/2003  T:  06/24/2003  Job:  [id number]

## 2011-01-28 NOTE — H&P (Signed)
[facility]  Patient:    [name], [name]                        MRN: [id number] Adm. Date:  11/06/00 Attending:  Judie Petit T. Pleas Koch., M.D. [facility] Dictator:   Venita Lick. Pleas Koch., M.D. CC:         [facility]  Dario Guardian, M.D.   History and Physical  CHIEF COMPLAINT:  Abdominal pain, nausea and vomiting.  HISTORY:  The patient is a generally healthy 50 year old white female diagnosed with diabetes apparently in 2000 which is being managed with sliding-scale insulin coverage.  She is status post C-section in 2001.  She does not have any other known chronic medical problems.  At this time, she has had onset of epigastric abdominal pain radiating into her back on Thursday evening, November 02, 2000.  She says since that time, the pain has been constant and progressive.  Initially, she was nauseated without vomiting but started with vomiting on Friday, November 03, 2000.  She has not had any documented fever, chills or diaphoresis and no diarrhea, melena or hematochezia.  She does say that her urine has been darker over the past two days.  Her appetite has been decreased; however, she has been able to eat some and, in fact, ate have of a sausage biscuit prior to coming to the emergency room.  Patient initially presented to the emergency room on February 23rd, late in the evening, due to increasing pain.  She underwent abdominal ultrasound which did show multiple small gallstones, no definite wall thickening, a common bile duct of 9.7 cm.  Distal duct was not visualized and pancreas was not visualized well either.  Labs at that time showed a WBC of 6.9, hemoglobin 11.6, hematocrit of 34.9, total bilirubin 2.3, alkaline phosphatase of 240, SGOT of 426, SGPT of 664, amylase negative and beta hCG was negative.  Patient was also found to have a urinary tract infection and was started on oral Cipro.  She was discharged to home with instructions to  follow up with Dr. Arlyce Dice today.  She called the office early this morning with continued pain and was advised to come to the emergency room for evaluation and admission.  She was seen and evaluated in the emergency room at this time and found to be hemodynamically stable, however, still in pain and is admitted to the hospital for further diagnostic evaluation and treatment.  CURRENT MEDICATIONS:  Insulin 70/30 sliding scale.  ALLERGIES:  AUGMENTIN which causes nausea and vomiting.  PAST HISTORY:  Adult-onset diabetes mellitus diagnosed in 2000; status post C-section, 2001.  FAMILY HISTORY:  Mother with diabetes and coronary disease.  Patient also states there are cancers in the family; she is not sure what type.  SOCIAL HISTORY:  Patient is married and has one child, age 74 months, and one child, age 75.  She is employed in Recruitment consultant at [employer].  She is a smoker of one pack per day.  No ETOH.  REVIEW OF SYSTEMS:  CARDIOVASCULAR:  Denies any chest pain or anginal symptoms.  PULMONARY:  Denies any cough, shortness of breath or sputum production.  GENITOURINARY:  Denies any dysuria, urgency or frequency.  She says her urine has been dark.  GI:  As above.  PHYSICAL EXAMINATION  GENERAL:  Well-developed white female in no acute distress.  She is alert and oriented x 3.  VITAL SIGNS:  Temperature is 97.1, blood  pressure 161/96, pulse is 119, respirations 16.  HEENT:  Non-traumatic, normocephalic.  PERLA.  Sclerae are slightly icteric.  NECK:  Supple without nodes.  No JVD or bruit.  CARDIOVASCULAR:  Regular rate and rhythm with S1 and S2.  PULMONARY:  Clear to A&P.  ABDOMEN:  Large, soft.  She is tender in the epigastrium and right upper quadrant.  There is no rebound.  She does have some early guarding.  No mass or hepatosplenomegaly.  RECTAL:  Exam not done at this time.  EXTREMITIES:  Without clubbing, cyanosis, or edema.  IMPRESSION 1. [age]-year-old  white female with choledocholithiasis, cholelithiasis    with abdominal pain, nausea, vomiting and hyperbilirubinemia, but    clinically no cholangitis. 2. Urinary tract infection with gram-negative rod, on Cipro. 3. Adult-onset diabetes mellitus, on sliding-scale insulin coverage. 4. Status post cesarean section x 1.  PLAN:  Patient is admitted to the service of Dr. Judie Petit T. Pleas Koch.  She will be kept n.p.o. and covered with IV Cipro.  We have her scheduled for ERCP with stone extraction this afternoon with Dr. Russella Dar.  We will ask for surgical consultation for a laparoscopic cholecystectomy this admission as well.  We will cover her with sliding-scale insulin.  For further details, please see the orders. DD:  11/06/00 TD:  11/07/00 Job: [id number]

## 2011-01-28 NOTE — Op Note (Signed)
NAMEJAMILLIA, [name]               ACCOUNT NO.:  [id number]   MEDICAL RECORD NO.:  [id number]          PATIENT TYPE:  OIB   LOCATION:  3701                         FACILITY:  [facility]   PHYSICIAN:  Nanetta Batty, M.D.   DATE OF BIRTH:  22-Oct-1960   DATE OF PROCEDURE:  08/11/2005  DATE OF DISCHARGE:                                 OPERATIVE REPORT   PROCEDURE:  Peripheral angiogram/percutaneous transluminal angioplasty and  stent.   Referred for Doppler secondary to PVOD with claudication and hypertension.  Renal arteries were found to be normal; however, she did have iliac  __________ and presents now for angiography and potential intervention.   PROCEDURE DESCRIPTION:  The patient was brought to the sixth floor [facility] peripheral vascular angiographic suite in the postabsorptive state.  She was premedicated with p.o. Valium, IV Versed and fentanyl.  Her right  groin was prepped and shaved in the usual sterile fashion.  Xylocaine 1% was  used for local anesthesia.  A 5 upgraded to a 7 [place] sheath was inserted  into the right femoral artery using standard Seldinger technique.  A 5  French tennis racquet catheter was used for midstream and distal abdominal  aortography with bifemoral runoff.  Visipaque dye was used for the entirety  of the case.  Retrograde aortic pressures were monitored during the case.  A  pullback gradient was obtained across the origin of the right common iliac  artery after __________   __________ mild to moderate atherosclerotic changes distally toward the  bifurcation.  1.  Left lower extremity:  30% ostial, 60% segmental mid-left SFA with three-      vessel runoff.  2.  Right lower extremity:      1.  A 70% segmental proximal right common iliac artery stenosis with a          40 mm pullback gradient.      2.  A 40% segmental mid-right SFA with three-vessel runoff.   IMPRESSION:  [name] has a hemodynamically significant proximal right  common iliac  artery stenosis.  We will proceed with PTA and stenting.   A 7 French 30 cm long Cordis break-tip sheath was exchanged and the patient  received 3000 units of heparin.  Using a __________ 0% residual.  The  patient tolerated the procedure well.  ACT was measured and the sheath was  removed.  Pressure was held on the groin to achieve hemostasis.   The patient left the lab in stable condition.  She will be discharged home  in the morning after being hydrated overnight.  Lower extremity Dopplers may  be asked to be obtained, and the patient will see me back in the office.  [employer] was notified of these results.      Nanetta Batty, M.D.  Electronically Signed     JB/MEDQ  D:  08/11/2005  T:  08/12/2005  Job:  [id number]   cc:   Second Floor Cardiac Catheterization Lab   Ernestina Penna, M.D.  Fax: [phone]   Nadara Mustard, P.A.  [facility]

## 2011-01-28 NOTE — H&P (Signed)
NAMESARABI, SOCKWELL               ACCOUNT NO.:  [id number]   MEDICAL RECORD NO.:  [id number]          PATIENT TYPE:  INP   LOCATION:  4740                         FACILITY:  [facility]   PHYSICIAN:  Norton Blizzard, M.D.    DATE OF BIRTH:  Jun 05, 1961   DATE OF ADMISSION:  08/23/2006  DATE OF DISCHARGE:  08/28/2006                              HISTORY & PHYSICAL   CHIEF COMPLAINT:  Increased sugar for 2 weeks.   HISTORY OF PRESENT ILLNESS:  This is a 50 year old female with a history  of diabetes mellitus, type 2, hypertension who presented to the ED today  after 2 weeks of very high sugars, fatigue, and right eye decreased  vision.  The patient states that the lowest the sugars have been in 2  weeks was 593.  She has been very fatigued over that time and much worse  the past 3 or 4 days.  Over this time, she also only sees blurs in one  of her right eye.  She takes sliding scale of Lantus anywhere from 14  to 40 units depending on her sugars and is also on Actos, but no short-  term meds including NovoLog to control her diabetes.   Ms. Meacham was also noted to have a blood pressure of 226/131 on  admission.  She complains of a headache off and on from the frontal area  radiating to the back to her head.  She states she has photophobia and  Tylenol did not help.  As noted above, she has much decreased vision in  her right eye.  She has had burning on urination and many urinary  problems.  She thinks she has not been thinking clearly over the past  few days.  She also has a vague complaint of chest pressure off and on  since one day with slight dyspnea and a green-productive cough.   It should be noted the patient seemed to answer yes to almost all  questions when asked.  Then, I could not get specific information  regaining her symptoms.   PAST MEDICAL HISTORY:  1. Diabetes mellitus type 2.  2. Hypertension.  3. Coronary artery disease.  4. Gastroesophageal reflux disease.  5.  Obstructive sleep apnea.  6. Anxiety.   PAST SURGICAL HISTORY:  1. Cholecystectomy.  2. Hysterectomy.  3. Upper teeth extraction.  4. Cardiac stent placed, November 24, 2005.  5. Right leg arterial stent placed about 1 year ago.   ALLERGIES:  AUGMENTIN CAUSING ANAPHYLAXIS AND HIVES.   MEDICATIONS:  1. Actos 30 mg daily.  2. Atenolol 100 mg b.i.d.  3. Clonidine 0.3 mg b.i.d.  4. Diovan/hydrochlorothiazide 175/25 mg b.i.d.  This dosage is not      available or known, and the patient must be unsure about the exact      formulation of this.  5. Lisinopril 20 mg daily.  6. Plavix 75 mg daily.  7. Lantus sliding scale 14 to 40 units daily.  8. Xanax 1 mg t.i.d. p.r.n. anxiety.   SOCIAL HISTORY:  The patient is married and lives with her husband and 6-  year-old daughter.  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  She has smoked 1-1/2 to 1 pack per day for 30 years.  Denies alcohol or drug use.   FAMILY HISTORY:  Not obtained.   REVIEW OF SYSTEMS:  GENERAL:  No fevers.  Positive for sweats and  occasional shaking, chills, and sick contacts of her daughter with  pneumonia 3 weeks ago.  HEENT:  Positive for headache, ear pain, hearing  changes, right eye vision decreased, sore throat.  NECK:  Positive for  swollen node.  RESPIRATORY:  Slight dyspnea, positive for productive  sputum that is green, positive for cough, positive for wheezing.  CARDIOVASCULAR:  Positive for palpitations.  See HPI for remainder.  GI:  Positive for nausea, vomiting, diarrhea.  Denies abdominal pain,  constipation, bright red blood per rectum.  GU:  As per HPI.  NEURO:  As  per HPI.   PHYSICAL EXAMINATION:  VITAL SIGNS:  Temperature 97.5, pulse 226/131  down to 150/96 with 0.2 mg of clonidine in the emergency department.  Pulse 113, respirations 40, pulse ox 98% on room air.  GENERAL:  In no acute distress, appears comfortable.  HEENT:  Atraumatic.  Extraocular movements  intact.  Pupils equal, round,  and reactive to light.  She has decreased vision in all fields from the  right eye.  Fundus is normal without hemorrhages or papillary edema.  Tympanic membranes are clear.  Pharynx is normal without erythema or  exudate.  She has tenderness to palpation over her frontomaxillary  areas, left greater than right.  NECK:  No lymphadenopathy, no thyromegaly.  CARDIOVASCULAR:  Regular rate and rhythm, no murmurs, rubs, or gallops.  LUNGS:  Clear to auscultation bilaterally.  No wheezes, rales, or  rhonchi.  No increased work of breathing.  ABDOMEN:  Soft, nondistended.  Questionable mild tenderness diffusely.  Bowel sounds positive, no hepatosplenomegaly.  EXTREMITIES:  Well perfused, no edema.  2+ bilateral lower extremity  pulses.  NEURO:  Cranial nerves II-XII grossly intact with the exception of the  right eye visual field.  Strength 5/5 in all extremities.  Also stretch  reflexes 2+ in biceps, brachioradialis, and patellar tendons.  Sensation  questionably decreased in the left lateral foot,  the C7 to C8 area, and  left upper extremity, but strength was intact and she does not have  concordant C7-C8 sensory loss higher up on the arm.  No  dysdiadochokinesia or dysmetria.  Gait is wide-based with open feet, as  she uses the railing and feels like she is going to fall towards her  right side due to her vision.  She is alert and oriented x3.   LABORATORY STUDIES:  Hemoglobin 13.9, white blood cell count 7.8,  platelets 338, sodium 128 corrects with her glucose at 659, potassium  4.6, chloride 103, bicarb 21.6, BUN 20, creatinine 1.0, anion gap is 4.  Clean-catch urinalysis had many bacteria, few epithelials, nitrate  positive, greater than 1000 glucose, 3 to 6 white blood cells on  microscopic.  CT of her head was without an acute process.   ASSESSMENT AND PLAN:  This 50 year old female with: 1. Hypertensive urgency.  The patient has multiple medical  complaints      that were difficult to process specifically as she has a vague      history of many issues.  Creatinine is 1.  No protein on      urinalysis.  Last creatinine was 0.9 three days  ago, so acute renal      failure is unlikely.  The patient is without altered mental status      and negative head CT.  There were no hemorrhages seen on fundal      exam.  Check her vision if possible here.  All visual fields are      noted to be affected in the right eye.  Consider outpatient      followup with ophthalmology shortly after discharge to assess or      possibly telemetry.  Will check her EKG and cardiac markers x3 to      assess for cardiac damage.  Chest x-ray to look for mediastinal      widening and to assess the patient's complaints of dyspnea and      productive cough.  We will recheck cathed UA specimen also.  We      will start her on labetalol p.o. 200 mg every 6 hours to maintain      diastolic blood pressure to about 100 to 105 to maintain organ      perfusion.  We will continue her clonidine to prevent rebound,      hypertension, and Diovan/hydrochlorothiazide and lisinopril as      well.  2. Hyperglycemia.  Questionably secondary to noncompliance versus      infection versus myocardial infarction.  We gave her 12 units of      NovoLog in the emergency department.  Will check cardiac markers      and EKG.  Do a cath urinalysis, gram stain and cultures.  We will      start with every 4 hours CBGs  and a moderate sliding scale of      insulin and check her requirements and switch Lantus.  Continue      Actos.  She will need close followup, and A1c is pending.  3. History of cardiac and femoral artery stents.  We will continue      Plavix.  4. Anxiety.  May be playing a role in her hypertension.  We will      discuss continuing Xanax.  5. Prophylaxis.  Sequential compression devices (SCDs), ambulate with      assistance.  6. Fluid, electrolytes, and nutrition/GI.  Will  monitor electrolytes.      IV fluid boluses, then 200 mL per hour of normal saline.  We will      also start her on carbohydrate modified diet.           ______________________________  Norton Blizzard, M.D.     SH/MEDQ  D:  08/28/2006  T:  08/29/2006  Job:  [id number]

## 2011-01-28 NOTE — Discharge Summary (Signed)
[name], [name]               ACCOUNT NO.:  [id number]   MEDICAL RECORD NO.:  [id number]          PATIENT TYPE:  OIB   LOCATION:  6523                         FACILITY:  [facility]   PHYSICIAN:  Nanetta Batty, M.D.   DATE OF BIRTH:  Nov 15, 1960   DATE OF ADMISSION:  11/24/2005  DATE OF DISCHARGE:  11/25/2005                                 DISCHARGE SUMMARY   DISCHARGE DIAGNOSIS:  1.  Coronary artery disease status post percutaneous coronary intervention      during this admission.  2.  Peripheral vascular disease status post right common iliac angioplasty      and stenting in November 2006.  3.  Hypertension.  4.  Hyperlipidemia.  5.  Non- insulin dependent diabetes mellitus.   HOSPITAL COURSE:  This is a 50 year old moderately overweight Caucasian  female with a prior history of PVD, hypertension, hyperlipidemia, who was  seen in our office with continuous complaint of disabling chest pain.  She  underwent Cardiolite stress test which had a normal result and the study was  done in October 2006.  Because of the patient's progressive symptoms of  chest pain, she underwent coronary angiography which revealed 90% stenosis  of the mid circumflex. Dr. Allyson Sabal performed this cath and placed a Cypher  stent into the circumflex with reduction of the stenosis from 90% to 0%.  The patient still had residual disease in the mid RCA, 60% stenosis in  distal RCA, proximal circumflex had 30% stenosis and mid-circumflex prior to  insertion of the stent had 40% stenosis.  The stent in the right common  iliac artery was patent.  The patient tolerated the procedure well.  The  next morning, she was assessed by Dr. Allyson Sabal, her groin did not reveal any  signs of hematoma or other problems, and she was considered to be stable for  discharge home.   DISCHARGE MEDICATIONS:  Actos 30 mg daily, Lisinopril 40 mg daily, Plavix 75  mg daily, Tenormin 100 mg b.i.d., Clonidine 0.3 mg b.i.d., aspirin 325 mg  p.o.  daily.   DISCHARGE INSTRUCTIONS:  No driving, no lifting greater than 10 pounds three  days post cath.  Discharge diet low cholesterol, low fat diet.  Dr. Allyson Sabal  will see the patient on December 09, 2005, at 3:15 p.m.      Raymon Mutton, P.A.      Nanetta Batty, M.D.  Electronically Signed    MK/MEDQ  D:  11/25/2005  T:  11/26/2005  Job:  [id number]

## 2011-01-28 NOTE — Group Therapy Note (Signed)
[name], Andrea NO.:  [id number]   MEDICAL RECORD NO.:  [id number]          PATIENT TYPE:  WOC   LOCATION:  [facility]                   FACILITY:  [facility]   PHYSICIAN:  [name], MD      DATE OF BIRTH:  10/28/1960   DATE OF SERVICE:  08/03/2004                                    CLINIC NOTE   REASON FOR VISIT:  This is a 50 year old female who presents for acute onset  of right breast pain.  The patient states that this happened previously in  September and she also had pus-like discharge that came out of a nipple that  was green.  She never went to the doctor for this.  She assumed it was an  infection that resolved.  The patient also has a history of a BIRADS  category 2 mammogram in August 2002 which showed a superficial nodule at 12  o'clock on the left breast.  They thought possibly inflammatory process was  going on.  She has not gotten a follow-up mammogram since.  Today, the  patient is complaining of right breast pain only.  The right breast is  diffusely tender with two areas that are more tender than others.  Around 1  o'clock there is a reddish lesion on the skin that looks like underlying  inflammation consistent with mastitis.  It is tender at this area.  The  total right outer quadrant from 11 o'clock to 7 o'clock is tender and there  is some hardness which is also consistent with mastitis, and the patient is  not breastfeeding as she had a hysterectomy and oophorectomy.  She is a  diabetic; however, her last hemoglobin A1c per the patient was in the 5's.  We will treat this as a mastitis with Keflex 500 mg p.o. q.i.d.  However, if  this is not resolved then she would need to follow up with a general  surgeon.  She does have an ultrasound and mammogram ordered for tomorrow.  The patient will unlikely be able to tolerate the mammogram due to the  severe breast pain, so we would reschedule that if she is unable to  tolerate.  The patient also  has been on HRT via estrogen patches.  She was  on the Estraderm patch 0.05 mg.  However, the patch would fall off twice a  week.  This was changed to what was intentionally to be a twice-week patch,  Alora 0.75 mg one patch twice a week.  However, these are not sticking at  all.  We are now going to change the patient to Premarin 0.625 mg p.o. daily  and see how this helps with the patient's menopausal symptoms and if she is  able to swallow these pills.  The patient will return to clinic in 2 weeks  for further evaluation.      KL/MEDQ  D:  08/03/2004  T:  08/03/2004  Job:  [id number]

## 2011-01-28 NOTE — Discharge Summary (Signed)
[facility]  Patient:    [name], [name]                      MRN: [id number] Adm. Date:  [id number] Disc. Date: [id number] Attending:  Allean Found CC:         [name], M.D. [phone])   Discharge Summary  DATE OF BIRTH:  April 19, 1961  PRIMARY CARE PHYSICIAN:  [name], M.D.  DISCHARGE MEDICATIONS: 1. Hydrochlorothiazide 25 mg 1 tablet q.d. 2. Cozaar 25 mg 1 tablet q.d. 3. Ceftin 500 mg 1 tablet b.i.d. x 11 days then stop. 3. Ibuprofen 800 mg 1 tablet b.i.d. 4. Percocet 7.5/500 1 tablet q.4h. as needed for pain for 5 days.  DISCHARGE DIAGNOSES: 1. Cellulitis dorsum of left foot. 2. Poorly controlled hypertension. 3. Poorly controlled diabetes type 2. 4. Microcytic anemia unknown etiology. 5. Tobacco abuse in the amount of 1/2 pack per day for 23 years. 6. Status post C-section delivery December 11, 1999.  PROCEDURES:  Plain film x-ray of left foot:  No evidence of gas, fracture, or osteomyelitis.  FOLLOW-UP:  The patient will return to the office of her primary care physician, Dr. Merri [name], on Wednesday, September 19, at 9:10 in the morning.  At that time, the patients left foot will be reevaluated to be sure that there is complete/continuing resolution of her cellulitis.  Additionally, diabetic care will be followed up, and the patient will be providing the physician at that time of CBG log with b.i.d. measurements so further adjustments in her future diabetic regimen can be made and consideration of an oral hypoglycemic will be made.  Additionally, the patients blood pressure on her regimen of HCTZ and Cozaar will be evaluated at that time.  Also, the patients anemia will be further evaluated at that time, and the results of an iron panel and a ferritin should be available after being faxed from the hospital to Dr. Lonn Georgia office.  Additionally, follow up of a basic metabolic panel is recommended in the next  two weeks, as the patient has been reinitiated on HCTZ therapy in conjunction with her Cozaar.  BRIEF ADMISSION HISTORY - HISTORY OF PRESENT ILLNESS:  [name] is a 50 year old Caucasian female with a history of uncontrolled diabetes, uncontrolled hypertension, and recent pregnancy, who reported to [facility] emergency room for pain in the left foot.  May 25, 2000, the day prior to admission, the patient had been evaluated in the emergency room and dosed x 1 with IV Rocephin.  The patient returned to the hospital on the day of admission for reevaluation and when it was found that the cellulitis was progressing, it was felt that it would be most appropriate for her to be admitted to the hospital for parenteral antibiotic therapy.  ADMISSION PHYSICAL EXAMINATION:  VITAL SIGNS:  Blood pressure 165/104, heart rate 116, respiratory rate 20, temperature 97.3, CBG 116.  HEENT:  Normocephalic, atraumatic, pupils, equal, round, reactive to light and accommodation, extraocular movements intact bilaterally, hearing grossly intact bilaterally.  NECK:  No lymphadenopathy and no thyromegaly.  LUNGS:  Clear to auscultation bilaterally.  No wheezing or rhonchi.  CARDIOVASCULAR:  Regular rate and rhythm, without murmur, gallop, or rub.  ABDOMEN:  Obese, nontender, nondistended, normal bowel sounds present, no hepatosplenomegaly.  EXTREMITIES:  Left lower extremity with erythema across the dorsum of the lateral aspect of the foot with associated 2+ edema to the ankle but no streaking of the  left lower leg or shin.  Right foot with no edema or erythema and 2+ dorsalis pedis pulse, and no erythema or edema in the left shin or lower leg.  CUTANEOUS:  No obvious breaks of her erythematous area of left foot and no suggestion of insect bites.  No tenting of the area between the toes and the left or right foot.  ADMISSION LABORATORIES:  From May 25, 2000, hemoglobin 12, platelets 356,  white 9.3, MCV 80.4, sodium 138, potassium 4.9, chloride 105, CO2 25, BUN 14, creatinine 0.9, glucose 135, calcium 9.5.  HOSPITAL COURSE: #1 - LEFT FOOT CELLULITIS:  Due to her diabetes and the possibility of polymicrobial infection, the patient was initially treated with Primaxin at 500 mg IV q.6.  The patient progressed well on the Primaxin and because of progression was subsequently changed to Ceftin on May 28, 2000.  Blood cultures x 2 during her hospitalization were negative for signs of bacteremia/sepsis.  A possible alternate diagnosis of pseudogout was entertained during hospitalization.  The patient was temporarily placed on a low dose of p.o. prednisone.  Uric acid was moderately elevated at 8.8. Further history was obtained; however, and due to the lack of arthritic joint complaints, it was felt that this was less likely.  This will be an issue for follow-up chronically in the future.  At the time of discharge, there was no erythema of the affected foot, and the patient was tolerating oral Ceftin without difficulty.  She will complete a 14-day course of antibiotic therapy with 11 additional days of Ceftin at 500 mg b.i.d. with food.  She is instructed that should she develop nausea or vomiting, or should severe pain or erythema return to the foot, that she should call her doctor immediately or report to the emergency room.  Pain will be managed with Percocet 7.5/500 1 tablet every 4 hours as needed and ibuprofen as well.  #2 - HYPERTENSION:  Per patients own report, she has been poorly compliant with her antihypertensives as prescribed by her primary doctor and in the past.  During hospitalization, she was maintained on HCTZ 25 mg q.d. and Cozaar 25 mg q.d. with blood pressures in the range of 115-135.  This regimen will be continued after her discharge, and it is recommended that a basic metabolic panel should be obtained in the next two weeks to assure that she  is tolerating her new increased dose of HCTZ without difficulty.  The patient was  counseled extensively on the danger of prolonged untreated hypertension and its ultimate effect on her health.  #3 - DIABETES:  At admission, the patient admitted to being poorly compliant with diabetic regimen including oral hypoglycemics.  In fact, at home she had been using 70/30 insulin on a sliding scale dosing as left over from her pregnancy and has not been taking oral hypoglycemics as directed by her primary care physician.  On a diabetic diet, CBGs were followed throughout the hospitalization, and the patient only required one dosing of four units of insulin on a sliding scale.  As a result, it was decided that the patient would be sent home to follow a diabetic diet, and we would monitor CBGs on a b.i.d. basis.  The patient is to perform these values and report them to her primary care physician on her follow-up appointment, and at that time the decision will be made as to whether oral hypoglycemics are appropriate or not. The patient does report that her primary care physician has sent  her for outpatient counseling and she is well versed as to a diabetic diet and using her CBG monitor which she has at home.  We will continue the Cozaar for its renal protective effect in light of the fact that the patient had an adverse reaction to an ACE inhibitor.  #4- MICROCYTIC ANEMIA:  At admission, the patients hemoglobin was borderline at around 12.5.  During hospitalization, it was found to be consistently in the 10.5-11 range.  MCV is borderline as well around 80.  It is most likely that this represents a microcytic anemia related to her pregnancy as well as menstrual flow.  Iron panel and ferritin were pending at the time of discharge; however, and these will be faxed to the primary care physicians office when available.  The patient denied any history of melena or hematochezia during her  hospitalization.  DISCHARGE LABORATORIES:  From date of discharge, hemoglobin 10.7, platelets 348, white count 9.2, MCV 80.1, TSH 4.2, blood cultures negative x 2, uric acid 8.8 DD:  05/29/00 TD:  05/30/00 Job: [id number]

## 2011-01-28 NOTE — Op Note (Signed)
NAMECOLLYNS, MCQUIGG                         ACCOUNT NO.:  [id number]   MEDICAL RECORD NO.:  [id number]                   PATIENT TYPE:  INP   LOCATION:  0471                                 FACILITY:  [facility]   PHYSICIAN:  Lesly Dukes, M.D.              DATE OF BIRTH:  1961-08-02   DATE OF PROCEDURE:  10/15/2003  DATE OF DISCHARGE:                                 OPERATIVE REPORT   PREOPERATIVE DIAGNOSIS:  A 50 year old female with left adnexal mass and cul-  de-sac mass.   POSTOPERATIVE DIAGNOSIS:  A 50 year old female with left adnexal mass and  cul-de-sac mass.   PROCEDURES:  1. Total abdominal hysterectomy, bilateral salpingo-oophorectomy.  2. Lysis of adhesions.  3. Resection of cul-de-sac mass.  4. Repair of rectum.   SURGEON:  Lesly Dukes, M.D.   ASSISTANT:  Shelbie Proctor. Shawnie Pons, M.D.   INTRAOPERATIVE CONSULTATION:  De Blanch, M.D.   ESTIMATED BLOOD LOSS:  100 mL.   COMPLICATIONS:  Laceration of the rectum, which was repaired.   PATHOLOGY:  Uterus, ovaries, fallopian tubes, and cul-de-sac mass.   FINDINGS:  Normal uterus.  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  Normal right ovary.  The  fallopian tubes had dense adhesions.  The bladder was also adhered to the  lower uterine segment and there was a cul-de-sac mass approximately 6 cm  that came back benign on frozen section.   DESCRIPTION OF PROCEDURE:  After informed consent was obtained, the patient  was taken to the operating room, where general anesthesia was found to be  adequate.  The patient was placed in low lithotomy position and prepared and  draped in normal sterile fashion.  A Foley was in the bladder.  A vertical  skin incision was made with a scalpel and carried down to the underlying  layer of fascia with the Bovie.  The fascia was incised in the midline  vertically and extended both superiorly and inferiorly.  The  peritoneum was  identified and tented up and entered sharply.  This incision was extended  both superiorly and inferiorly with good visualization of the bladder.  The  patient was placed in Trendelenburg and a Balfour retractor was placed in  the abdomen.  The bowel was safely packed away and the bladder was also out  of the way of the abdominal incision.  The round ligaments were ligated,  transected, and found to be hemostatic.  The bladder flap was then created  with sharp dissection.  The bladder was filled retrograde with saline to  ensure that there was no damage to the bladder during the dissection. Once  the dissection was completed, the bladder was emptied.  The IP ligaments  were then identified, doubly ligated with 0 Vicryl and transected and found  to be hemostatic.  The left ovary was then sent for frozen pathology, which  came back benign.  The uterine arteries were then skeletonized, doubly  clamped, transected, and suture ligated with 0 Vicryl.  Good hemostasis was  noted.  The cervix was then removed, taking stepwise bites with a Kocher  clamp until the bottom of the cervix was reached.  Good hemostasis was  noted.  Two curved Heaneys were then used to come across the top of the  vagina, and the specimen was transected and sent off to pathology.  The  vaginal cuff was closed with 0 Vicryl and found to be hemostatic.  At this  point exploration of the cul-de-sac was performed.  It was noted that there  was a retroperitoneal mass.  De Blanch, M.D., was then called in  for an intraoperative consult.  Please see his dictation for details of this  dissection and repair of the rectum.  At the end of his procedure a drain  was placed in the cul-de-sac and brought out through the abdominal wall.  The abdomen was then copiously irrigated with water and everything was found  to be hemostatic.  The fascia was then closed with looped 0 PDS in a running  fashion.  Good  hemostasis was noted.  The subcutaneous tissue was copiously  irrigated and the skin was closed with staples.  The patient tolerated the  procedure well.  The sponge, lap, instrument, and needle counts were correct  x2, and the patient went to the recovery room in stable condition.                                               Lesly Dukes, M.D.    [name]  D:  10/15/2003  T:  10/16/2003  Job:  [id number]

## 2011-01-28 NOTE — H&P (Signed)
[name], ROBBEN NO.:  [id number]   MEDICAL RECORD NO.:  [id number]          PATIENT TYPE:  EMS   LOCATION:  [facility]JO                         FACILITY:  [facility]   PHYSICIAN:  Isidor Holts, M.D.  DATE OF BIRTH:  [birth year]   DATE OF ADMISSION:  04/25/2006  DATE OF DISCHARGE:                                HISTORY & PHYSICAL   PRIMARY CARE PHYSICIAN:  [employer].   CHIEF COMPLAINT:  Tooth ache with swelling and pain right cheek, for  approximately one week.   HISTORY OF PRESENT ILLNESS:  This is a 50 year old female.  For past medical  history, see below.  She is a known diabetic, and according to her, she has  been feeling quite well with adequately controlled blood sugars until April 19, 2006, when she developed a tooth ache.  She has taken over-the-counter  analgesics, including ibuprofen, Tylenol and Goody powder to no avail. This  was associated subsequently with swelling and pain of the right cheek, which  has become progressive.  On April 24, 2006, she went to see her PMD at  [facility] practice and at that time, was found to have elevated  blood sugars and she was started on Septra, which she was supposed to take  one daily for 10 days.  She had completed two days of this treatment on  April 25, 2006, however, symptoms have not got any better and she now has  pain affecting the lower jaw and also the neck.  She called her PMD, who  referred her to the emergency department.  Patient denies vomiting or  diarrhea.  Denies fever or chills.   PAST MEDICAL HISTORY:  1. Coronary artery disease, status post PCI March 2007, (Dr. Allyson Sabal).  2. Peripheral vascular disease, status post right common iliac artery      angioplasty and stent November 2006.  3. Hypertension.  4. Hyperlipidemia.  5. Type 2 diabetes mellitus.  6. Anxiety disorder.  7. History of pseudotumor cerebri.  8. Gastroesophageal reflux disease.  9. Obstructive  sleep apnea syndrome, confirmed by polysomnography April      2007.  Patient has home CPAP but is noncompliant, secondary to      claustrophobia.  10.Status post cesarean section x1 with bilateral tubal ligation.  11.Status post total abdominal hysterectomy and bilateral salpingo-      oophorectomy.  12.Status post laparoscopic cholecystectomy.   MEDICATIONS:  1. Actos 30 mg p.o. daily.  2. Atenolol 100 mg p.o. b.i.d.  3. Clonidine 0.3 mg p.o. b.i.d.  4. Diovan/hydrochlorothiazide 160/12.5 one p.o. b.i.d.  5. Lisinopril 20 mg p.o. daily.  6. Plavix 75 mg p.o. daily.  7. Effexor XR 225 mg p.o. q.a.m.  8. Lantus insulin 16 units subcutaneously q.a.m. and 13 units      subcutaneously q.p.m.   ALLERGIES:  Patient is allergic to AUGMENTIN.  This causes hives, nausea,  and shortness of breath.   REVIEW OF SYSTEMS:  As per HPI and chief complaint, otherwise negative.   SOCIAL HISTORY:  Patient is married.  She is a Education administrator.  She has two  offspring, both of whom are alive and well.  Nondrinker.  Has been smoking  for approximately 30 years and states that she is now down to a half pack of  cigarettes a day.  No history of drug abuse.   FAMILY HISTORY:  Patient has one brother and one sister, her brother is  diabetic. Patient's mother had coronary artery disease and diabetes  mellitus, however, died from intestinal gangrene.  Father's health history  is not known.   PHYSICAL EXAMINATION:  GENERAL APPEARANCE:  Patient is quite comfortable,  alert, communicative, not in acute distress.  Not short of breath at rest.  VITAL SIGNS:  Temperature 97.7, pulse 114 per minute and regular,  respiratory rate 20, blood pressure initially 193/122, now 173/99 mmHg.  Pulse oximeter 97% on room air.  HEENT:  No clinical pallor.  No jaundice, no conjunctival injection.  Patient has mild swelling in right malar region and slight erythema.  Area  is somewhat tender to palpation.  Patient also does have  mild tenderness in  the right submandibular region, although lymph nodes are not palpable.  Examination of oral cavity reveals clear throat.  Patient, however, has  obvious dental sepsis and gum disease affecting the upper premolars on the  right and some purulence along the gum line.  NECK:  Supple.  JVP not seen.  No palpable lymphadenopathy.  No palpable  goiter.  CHEST:  Chest is clear to auscultation, no wheezes or crackles.  CARDIOVASCULAR:  Heart sounds 1 and 2 heard, normal, regular, no murmurs.  ABDOMEN:  Moderately obese, soft and nontender.  No palpable organomegaly,  no palpable masses.  Normal bowel sounds.  Lower abdominal surgical scar is  noted.  EXTREMITIES:  Lower extremities with no pitting edema.  Palpable peripheral  pulses.  MUSCULOSKELETAL:  Unremarkable.  CENTRAL NERVOUS SYSTEM:  No focal neurologic deficits on gross examination.   INVESTIGATIONS:  Electrolytes:  Sodium 132, potassium 5.9, chloride 107, CO2  21.8, BUN 19, creatinine 0.9, glucose 529.  Rechecked, following findings  were obtained.  Sodium 130, potassium 6.1, chloride 99, CO2 20, BUN 18,  creatinine 1, glucose 514.  Hemoglobin 14.6, hematocrit 43.   ASSESSMENT/PLAN:  1. Dental sepsis and possible abscess.  Patient has been started on      clindamycin in the emergency department.  We shall add vancomycin for      broad spectrum coverage.  Clindamycin will certainly be adequate for      anaerobic infection.  We shall arrange Panorex views of the right      maxilla to rule out abscess.  Dental consultation appears indicated.      We shall arrange this.  Meanwhile, will administer as needed      analgesics.   1. Uncontrolled diabetes mellitus.  Number 1 above, may be contributory.      Patient is currently on insulin per Glucommander protocol, commenced in      the emergency department.  Will continue this for now.  When p.o.     intake becomes more reliable/stable and glycemia improved, we shall       reinstitute scheduled Lantus, with sliding scale insulin coverage.      Meanwhile, will also administer intravenous fluids.   1. History of coronary artery disease.  This is asymptomatic at present.   1. History of obstructive sleep apnea syndrome.  This appears stable.   1. Uncontrolled hypertension.  We shall continue pre-admission  antihypertensive medications and monitor.  Adjust medications as      indicated.   1. Hyperkalemia.  This should improve with glucose and insulin therapy,      however, we shall administer Kayexalate 15 g p.o. x1 dose and follow      electrolytes.   Further management will depend on clinical course.      Isidor Holts, M.D.  Electronically Signed     CO/MEDQ  D:  04/25/2006  T:  04/25/2006  Job:  [id number]

## 2011-01-28 NOTE — Group Therapy Note (Signed)
NAME:  [name], [name]                         ACCOUNT NO.:  [id number]   MEDICAL RECORD NO.:  [id number]                   PATIENT TYPE:  OUT   LOCATION:  [facility]                           FACILITY:  [facility]   PHYSICIAN:  [name], MD                   DATE OF BIRTH:  [birth year]   DATE OF SERVICE:                                    CLINIC NOTE   The patient is a 50 year old para 2-0-0-2 female who underwent exploratory  laparotomy, TAH BSO, removal of perirectal mass and repair of rectum at  [facility] on October 15, 2003.  Dr. De Blanch was on  backup and took out the perirectal mass and repaired the rectum.  The  patient had an uncomplicated postoperative course and went home Sunday.  The  patient presents today for staple removal.  The patient has no complaints,  no drainage from the incision site, no fevers, or severe abdominal pain.   PHYSICAL EXAMINATION:  VITAL SIGNS:  Blood pressure, today, 194/98, pulse  74.  ABDOMEN:  Soft, nontender, nondistended.  Incision, clean, dry and intact.   Staples removed, small separation at the inferior aspect of the incision.  Steri-Strips were placed along the incision.   ASSESSMENT:  1. Status post total abdominal hysterectomy.  2. Bilateral salpingo-oophorectomy.  3. Removal of perirectal mass.   PLAN:  1. Continue to shower daily, as Steri-Strips start to loosen just peel off.  2. Call with any drainage from the incision or fevers.  3. The patient was instructed to take the Atenolol that Dr. __________ had     prescribed perioperatively.  This is in addition to Lisinopril and     hydrochlorothiazide for her hypertension.  4. The patient is to return to the clinic in four weeks to discuss options     of hormone replacement therapy.                                               [name], MD    KL/MEDQ  D:  10/21/2003  T:  10/22/2003  Job:  [id number]

## 2011-01-28 NOTE — Discharge Summary (Signed)
[name], [name]               ACCOUNT NO.:  [id number]   MEDICAL RECORD NO.:  [id number]          PATIENT TYPE:  INP   LOCATION:  3732                         FACILITY:  [facility]   PHYSICIAN:  Mobolaji B. Bakare, M.D.DATE OF BIRTH:  10-20-1960   DATE OF ADMISSION:  04/25/2006  DATE OF DISCHARGE:  04/19/2006                                 DISCHARGE SUMMARY   ADMISSION DATE:  April 25, 2006   DATE OF DISCHARGE:  April 28, 2006   FINAL DIAGNOSES:  1. Gram negative bacteremia.  Dental abscess/chronic apical      periodontitis/chronic periodontitis/acute pulpitis.  2. Uncontrolled diabetes mellitus.  3. Hypertension.  4. Hypokalemia.  5. Dyslipidemia.   SECONDARY DIAGNOSES:  1. Coronary artery disease.  Recent percutaneous coronary intervention and      stenting using drug eluding stents in March 2007.  2. Obstructive sleep apnea.  3. Obesity.  4. Peripheral vascular disease status post proxima right common iliac      artery stent.   PROCEDURES:  1. Orthopantogram showed no acute findings.  2. Teeth extraction done by Dr. Kristin Bruins for acute periapical abscess,      chronic periodontitis and chronic apical periodontitis, acute pulpitis.      Extraction of teeth #3, #4, #5, #6, #7, #8, #9, #10, #11, #12, #13.      She also had 2 quadrants of alveoloplasty maxillary left maxillary and      right maxillary tuberosity reductions.   BRIEF HISTORY AND PHYSICAL:  [name] is a 50 year old Caucasian female  with significant medical problems.  She presented to the emergency room with  toothache associated with swelling and pain over her right cheek which has  been going on for 1 week.  She was noted on evaluation in the emergency room  to have very low glucose and patient was admitted for IV antibiotics and  treatment of toothache along for surgical consult and dental consultation  and control of blood glucose.   It is to be noted that patient ran out of medications some weeks  prior to  presentation and she was off Plavix for some time because she could not get  it refilled.  She was started on medication a few days prior to  hospitalization.  It is also noted that patient was seen at [facility] on April 24, 2006 and was started on Septra.  However, this  has not improved the symptoms and she presented to the emergency room.  Please see admission H & P for full details.   HOSPITAL COURSE:  Ms. Golladay was started on broad spectrum antibiotic with  anaerobic coverage.  She had combination of clindamycin and vancomycin.  She  was afebrile.  Symptoms have improved since she had the teeth extraction.  She will be discharged home on p.o. clindamycin.  She will need to see Dr.  Kristin Bruins in 1 week.  Patient was given appropriate instruction as to post  procedure management of the teeth extraction.  Mechanical soft diet was  recommended by dietician.  She should use salt water as instructed to  rinse  every 2 hours while awake.   1. Bacteremia:  Patient had blood cultures drawn in the emergency room on      admission.  This was 1 set.  It was reported as growing gram negative      rods on April 28, 2006 which was approximately 3 days after it was      drawn.  She has been empirically started on cefepime pending final      identification and sensitivity which is pending at this time.  Patient      did not demonstrate any fever, chills and initial white cell count was      normal.  I suspect this is probably from the dental abscess which has      been taken care of.  We are waiting for final identification and      sensitivity.  2. Uncontrolled diabetes mellitus:  This was thought to be secondary to a      combination of non compliance with medication and acute dental      infection.  Hemoglobin A1c was done and revealed very high numbers of      11.1%, clearly she has not been well controlled prior to      hospitalization.  I have added Metformin to  current medication.  This      can be further optimized in the outpatient setting.  She will be going      home on a combination of Lantus, Actos and Metformin.  She was      instructed to monitor blood glucose level.  3. Hypokalemia:  Patient presented to the emergency room with potassium of      5.9.  She was not on any potassium supplement.  Of note is that she was      in the combination of both Diovan and lisinopril.  Lisinopril was      discontinued.  Patient received Kayexalate and the potassium      stabilized.  At the time of discharge, potassium was 3.9.  Despite      discontinuation of lisinopril, blood pressure remained within an      acceptable level.  It ranged between 110-120 systolic, and diastolic 60-      70.  4. Dyslipidemia:  Patient had fasting lipase profile done which showed      very high triglyceride.  LDL could not be read.  Fasting triglyceride      was 922.  Tricor 145 mg was added to current Statin.  5. All other medical problems including obstructive sleep apnea,      peripheral vascular disease, coronary artery disease and obesity was      stable during the course of this hospitalization.  There was no changes      made to medications.   DISCHARGE MEDICATIONS:  1. Clindamycin 300 mg 4 times a day for 5 more days.  2. Tricor 145 mg daily.  3. Metformin 500 mg b.i.d.  4. Nicotine patch 14 mg daily.  5. AcipHex 20 mg daily.  6. Ambien 10 mg q.h.s.  7. Plavix 75 mg daily.  8. Atenolol 100 mg p.o. b.i.d.  9. Clonidine 0.3 mg p.o. b.i.d.  10.Diovan HCT 160/12.5 p.o. b.i.d.  11.Effexor XR 225 mg p.o. daily.  12.Lantus 16 units q. a.m.  13.Lantus 13 units subcu q.p.m.  14.Actos 30 mg p.o. daily.  Addendum will be added to these medication on discharge with respect for  further antibiotics.   DISCHARGE LABORATORY  DATA:  White cells 7.5, hemoglobin 11.1, hematocrit 2.2, platelets 272, sodium 137, potassium 3.9, chloride 105, bicarb 24,  glucose 215, BUN  13, creatinine 0.8, bilirubin 0.5, alkaline phosphatase  107, AST 16, ALT 16, total protein 5.9, albumin 2.9, calcium 8.8, hemoglobin  A1c 11.1, total cholesterol 315, triglyceride 922, HDL 29, LDL could not be  calculated.  Blood culture grew in gram negative rods, final report pending.      Mobolaji B. Corky Downs, M.D.  Electronically Signed     MBB/MEDQ  D:  04/28/2006  T:  04/29/2006  Job:  [id number]   cc:   Nanetta Batty, M.D.  Ernestina Penna, M.D.

## 2011-01-28 NOTE — Discharge Summary (Signed)
[name], [name]               ACCOUNT NO.:  [id number]   MEDICAL RECORD NO.:  [id number]          PATIENT TYPE:  INP   LOCATION:  4740                         FACILITY:  [facility]   PHYSICIAN:  Lupita Raider, M.D.   DATE OF BIRTH:  04/26/1961   DATE OF ADMISSION:  08/23/2006  DATE OF DISCHARGE:  08/28/2006                               DISCHARGE SUMMARY   PRIMARY CARE PHYSICIAN:  [facility].   CONSULTS:  Molly Maduro L. Dione Booze, M.D.   PROCEDURE:  Lumbar puncture with an opening pressure of 20.2 and a  closing pressure of 15.8, with zero cells, elevated glucose, and normal  protein.   DISCHARGE DIAGNOSES:  1. Headache, may be secondary to pseudotumor.  2. Vision changes of unknown etiology.  3. Type 2 diabetes that is uncontrolled.  4. Uncontrolled hypertension.  5. Klebsiella urinary tract infection.  6. Dyslipidemia.  7. Cardiovascular disease status post drug-eluting stent in March      2007.  8. Peripheral vascular disease status post proximal right common iliac      stent and carotid artery stenosis seen on this admission.  9. Obstructive sleep apnea.  10.Obesity.   DISCHARGE MEDICATIONS:  1. OxyContin 20 mg p.o. b.i.d; patient given #16 tablets.  2. Aspirin 81 mg p.o. daily.  3. Plavix 75 mg p.o. daily (#4).  4. Clonidine 0.1 mg p.o. b.i.d. x1 week, then 0.1 mg p.o. daily x1      week, and then stop.  5. Atenolol 50 mg p.o. b.i.d.  6. Ciprofloxacin 500 mg p.o. b.i.d. until August 31, 2006, to      complete a 7-day course.  7. Hydrochlorothiazide 25 mg p.o. daily.  8. Lisinopril 20 mg p.o. daily.  9. Actos 30 mg p.o. daily.  10.Lantus 60 units subcutaneous daily.  11.Zocor 20 mg p.o. daily.   The patient was to hold her Diovan and wean her clonidine as above.   FOLLOWUP:  1. The patient is to follow up with Dr. Dione Booze today immediately after      discharge at his office for further evaluation and examination of      her retina for an ophthalmologic diagnosis.  2. The patient is to call her neurologist, Dr. Shirl Harris of      [place], for followup this week regarding her pseudotumor and      headache.  3. The patient is to follow up at [facility] this week for followup      of her diabetes and hypertension.   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  She  was admitted with hyperglycemia, severe headache, and right visual field  sparks, and blurriness.   LABS:  1. CBC stable with a hemoglobin of 12.5.  Chemistries within normal      limits except for glucose that stayed in the 200s and creatinine of  1.3 on discharge.  Uric acid 6.2.  Hemoglobin A1C 14.1.  Cardiac      enzymes negative x2.  Total cholesterol 375, triglycerides 526, HDL      40, LDL unable to be calculated.  CSF:  1 white blood cell, no red      blood cells, glucose 113, total protein 44.  Urine culture positive      for Klebsiella that is sensitive to ciprofloxacin.  2. Noncontrast head CT:  Negative for an intracranial abnormality.  3. Chest x-ray:  Mild changes of COPD and chronic bronchitis, but no      acute abnormality.  4. Carotid Dopplers showed 60%-80% stenosis of the left internal      carotid and 40%-60% stenosis of the right internal carotid.      Vertebral artery flow was antegrade bilaterally.  There was      moderate  inhomogeneous plaque in the bulb of the vessel of the      left internal carotid artery.  5. MRI/MRA of the brain was negative for any acute abnormality.  No      significant infarction was identified.  There was mild atrophy.      Vessels are patent.   HOSPITAL COURSE BY PROBLEM:  1. Hyperglycemia.  This is likely secondary to medication      noncompliance as she reported taking a sliding-scale  of her      Lantus between 11 and 40 units based on her fasting blood sugars in      the morning, but also could have a component of Klebsiella UTI for      which she was complaining of dysuria.  She was titrated to a Lantus      dose of 60 units in the morning, and her UTI was treated with      ciprofloxacin.  Her A1C shows obvious poor control that has been      worsening over the past few months.  She was re-educated on the use      of Lantus and how it is not a sliding-scale tool, as well as to      continue to take her Actos and a carb-modified diet.  She will need      followup at [facility] for likely further increasing of her      Lantus, as her sugars were still not adequate with the 60 units in      the morning on the hospital carb-modified diet.  We will continue      her Actos for now.  2. Headache.  On admission, the patient's head CT was negative.  Given      the fact though that she has this history of pseudotumor cerebrum      and has to have multiple therapeutic taps, a lumbar puncture was      performed, her opening pressure was elevated at 20.2, and she was      drained subsequently down to 15.8.  We attempted to start      acetazolamide, but the patient did not tolerate the transition to      p.o. and refused to continue taking it.  The pain was somewhat      difficult to control, but was eventually made somewhat better by IV      Dilaudid and Percocet around the clock.  The headache did appear to      be somewhat out of proportion to her complaints as she was able to  carry on a full conversation and go downstairs to smoke, with an      incapacitating headache.  She was discharged home on 16 tablets      of 20-mg OxyContin b.i.d. to control the headache until she can      follow up with Dr. Tammi Klippel this week to discuss further need for      therapeutic taps, plus/minus potentially getting a shunt. 3. Visual changes:  Dr. Dione Booze was gracious enough to see the  patient      in-house and attempt a funduscopic exam, but was unable to fully      diagnosed her visual changes, although he did order a carotid      Doppler that shows significant stenosis on the left side.  It was      thought that potentially this could have been contributing to some      visual field emboli, but an MRI/MRA of the brain were negative      including the occipital field, so it was felt that the patient's      visual changes are very likely related to the eye itself.  The      patient was discharged from the hospital to go directly to Dr.      Laruth Bouchard office for further evaluation and workup of this visual      field change.  4. Hypertension.  The patient was admitted with hypertensive urgency      with blood pressures in the 200s/100s, which I presume was from      clonidine noncompliance.  I also have a feeling that the patient      was on so many blood pressure medicines secondary to being seen in      the clinic with rebound hypertension after being somewhat      noncompliant with her clonidine.  Because of this, I do not feel      like clonidine is a very good medicine for this patient.  We      attempted to wean her down gradually, for which her blood pressures      tolerated quite well.  On discharge the patient was taking 0.1 mg      p.o. b.i.d. plus the taper every day for a week, and then to stop.      Her ARB was held, and an ACE was started in addition to the      hydrochlorothiazide and beta blocker to hopefully continue to      regain control of her blood pressures.  This will need to be      followed closely by [facility].  5. Dyslipidemia.  The patient's cultural panel was frankly poor.  I do      wonder how much of her elevated CBGs are contributing to this, so I      think it is important to get her sugars under control.  She was,      however, started on low-dose Zocor with the intention that this      will probably need to be titrated up as her CBGs  come under better      control.           ______________________________  Lupita Raider, M.D.     KS/MEDQ  D:  08/28/2006  T:  08/29/2006  Job:  [id number]

## 2011-02-02 ENCOUNTER — Telehealth: Payer: Self-pay | Admitting: Family Medicine

## 2011-02-02 NOTE — Telephone Encounter (Signed)
Pt had dopplers done today, is going for procedure tomorrow but pt thinks Dr. Gery Pray is going to admit pt to the hospital & pt wanted Dr. Katrinka Blazing to know.

## 2011-02-02 NOTE — Telephone Encounter (Signed)
FYI to MD

## 2011-02-03 ENCOUNTER — Inpatient Hospital Stay (HOSPITAL_COMMUNITY): Payer: Medicaid Other

## 2011-02-03 ENCOUNTER — Inpatient Hospital Stay (HOSPITAL_COMMUNITY)
Admission: RE | Admit: 2011-02-03 | Discharge: 2011-02-15 | DRG: 038 | Disposition: A | Discharge status: Home / Self Care | Payer: Medicaid Other | Source: Ambulatory Visit | Attending: Thoracic Surgery (Cardiothoracic Vascular Surgery) | Admitting: Thoracic Surgery (Cardiothoracic Vascular Surgery)

## 2011-02-03 DIAGNOSIS — E785 Hyperlipidemia, unspecified: Secondary | ICD-10-CM | POA: Diagnosis present

## 2011-02-03 DIAGNOSIS — I959 Hypotension, unspecified: Secondary | ICD-10-CM | POA: Diagnosis not present

## 2011-02-03 DIAGNOSIS — Z888 Allergy status to other drugs, medicaments and biological substances status: Secondary | ICD-10-CM

## 2011-02-03 DIAGNOSIS — D62 Acute posthemorrhagic anemia: Secondary | ICD-10-CM | POA: Diagnosis not present

## 2011-02-03 DIAGNOSIS — E119 Type 2 diabetes mellitus without complications: Secondary | ICD-10-CM | POA: Diagnosis present

## 2011-02-03 DIAGNOSIS — Z8673 Personal history of transient ischemic attack (TIA), and cerebral infarction without residual deficits: Secondary | ICD-10-CM

## 2011-02-03 DIAGNOSIS — I739 Peripheral vascular disease, unspecified: Secondary | ICD-10-CM | POA: Diagnosis present

## 2011-02-03 DIAGNOSIS — I6529 Occlusion and stenosis of unspecified carotid artery: Secondary | ICD-10-CM

## 2011-02-03 DIAGNOSIS — I2510 Atherosclerotic heart disease of native coronary artery without angina pectoris: Secondary | ICD-10-CM

## 2011-02-03 DIAGNOSIS — Z794 Long term (current) use of insulin: Secondary | ICD-10-CM

## 2011-02-03 DIAGNOSIS — Z880 Allergy status to penicillin: Secondary | ICD-10-CM

## 2011-02-03 DIAGNOSIS — I10 Essential (primary) hypertension: Secondary | ICD-10-CM | POA: Diagnosis present

## 2011-02-03 DIAGNOSIS — F17200 Nicotine dependence, unspecified, uncomplicated: Secondary | ICD-10-CM | POA: Diagnosis present

## 2011-02-03 DIAGNOSIS — E876 Hypokalemia: Secondary | ICD-10-CM | POA: Diagnosis not present

## 2011-02-03 LAB — HEPARIN LEVEL (UNFRACTIONATED): Heparin Unfractionated: 0.13 IU/mL — ABNORMAL LOW (ref 0.30–0.70)

## 2011-02-03 LAB — APTT: aPTT: 26 seconds (ref 24–37)

## 2011-02-03 LAB — GLUCOSE, CAPILLARY
Glucose-Capillary: 315 mg/dL — ABNORMAL HIGH (ref 70–99)
Glucose-Capillary: 147 mg/dL — ABNORMAL HIGH (ref 70–99)
Glucose-Capillary: 317 mg/dL — ABNORMAL HIGH (ref 70–99)

## 2011-02-03 LAB — POCT I-STAT 3, ART BLOOD GAS (G3+)
pCO2 arterial: 28.5 mmHg — ABNORMAL LOW (ref 35.0–45.0)
pO2, Arterial: 101 mmHg — ABNORMAL HIGH (ref 80.0–100.0)

## 2011-02-03 LAB — CBC
MCH: 28.8 pg (ref 26.0–34.0)
MCV: 83.7 fL (ref 78.0–100.0)
Platelets: 305 10*3/uL (ref 150–400)
RBC: 4.72 MIL/uL (ref 3.87–5.11)
RDW: 12.4 % (ref 11.5–15.5)
WBC: 9.1 10*3/uL (ref 4.0–10.5)

## 2011-02-03 LAB — MRSA PCR SCREENING: MRSA by PCR: NEGATIVE

## 2011-02-03 LAB — BASIC METABOLIC PANEL
BUN: 23 mg/dL (ref 6–23)
Chloride: 101 mEq/L (ref 96–112)
Potassium: 4.5 mEq/L (ref 3.5–5.1)

## 2011-02-03 NOTE — Consult Note (Signed)
[name], [name]               ACCOUNT NO.:  [id number]  MEDICAL RECORD NO.:  [id number]           PATIENT TYPE:  O  LOCATION:  2807                         FACILITY:  [facility]  PHYSICIAN:  Salvatore Decent. Cornelius Moras, M.D. DATE OF BIRTH:  08-Aug-1961  DATE OF CONSULTATION:  02/03/2011 DATE OF DISCHARGE:                                CONSULTATION   REQUESTING PHYSICIAN:  Nanetta Batty, M.D.  REASON FOR CONSULTATION:  Left main disease with three-vessel coronary artery disease, status post off-pump single-vessel coronary artery bypass grafting in 2009.  HISTORY OF PRESENT ILLNESS:  Ms. [name] is a 50 year old obese white female from [place], [place] with longstanding history of coronary artery disease, cerebrovascular disease, hypertension, hyperlipidemia, type 2 diabetes mellitus, and longstanding tobacco abuse.  The patient's cardiac history dates back to 2008 when she first started having symptoms of angina pectoris.  She underwent PCI and stenting of the mid-left circumflex coronary artery by Dr. Nanetta Batty at that time.  She continued to have angina.  Ultimately, she underwent off-pump coronary artery bypass grafting x1 with placement of a reversed saphenous vein graft to the posterior descending coronary artery in January 2009.  Initially, she did quite well after that.  In March 2010, she developed recurrent chest pain and catheterization revealed narrowing of the proximal portion of the vein graft.  She underwent PCI and stenting of the vein graft at that time.  In 2010, she underwent stenting of both common iliac arteries by Dr. Allyson Sabal.  She continues to have chronic pain in both lower extremities since then as well as chronic symptoms suggestive of claudication.  In October 2010, she presented with a left hemispheric stroke.  She was found to have 80% left carotid stenosis, and she underwent stenting of her left carotid artery by Dr. Allyson Sabal and Dr. Myra Gianotti in November  2010.  In May 2011, she was hospitalized with another episode of prolonged right-sided weakness and dysarthria similar to the symptom she suffered at the time of her original stroke.  Later during that hospitalization, she underwent repeat catheterization because of episode of substernal chest discomfort consistent with angina pectoris.  This revealed progression of left main disease between 50 and 70% stenosis of the distal left main coronary artery as well as 60% ostial stenosis of left circumflex coronary artery.  There was progression of native disease in the posterior descending coronary artery beyond the distal anastomosis of the vein graft.  Cerebral arteriogram confirmed the presence of significant in- stent restenosis of the left carotid artery.  The patient was treated medically.  Ms. [name] was seen in the office by Dr. Allyson Sabal 2 days ago and was noted to complain of symptoms of nocturnal angina over the last 3 or 4 weeks.  Repeat carotid duplex scan was performed demonstrating severe bilateral internal carotid artery stenosis with approximately 90% stenosis on the right and at least 70-80% stenosis on the left.  She was brought in for elective cardiac catheterization with bilateral cerebral arteriogram by Dr. Gery Pray today.  Cerebral arteriogram demonstrates high- grade right internal carotid artery stenosis and left internal carotid artery stenosis.  Cardiac  catheterization demonstrates further progression of left main disease now with perhaps 70-80% smooth distal tapered stenosis of the distal left main coronary artery as well as high- grade 90% stenosis at the proximal end of the stent in the left circumflex coronary artery.  Left ventricular systolic function remains normal.  Cardiothoracic surgical consultation was requested.  REVIEW OF SYSTEMS:  GENERAL:  The patient reports some decreased appetite over the last few months.  She thinks she may have lost as much as 10 pounds.   She has had decreased energy.  CARDIAC:  Notable for 3-4 week history of occasional episodes of nocturnal angina pectoris.  She describes classical dull pain radiating across her left chest occasionally down her left arm, which has woke her up from her sleep on several occasions over the last 3 or 4 weeks.  She states that it does not happen every night and when it happens, it usually resolves spontaneously within 10 minutes or so without any sort of intervention. It has been associated with some mild diaphoresis and shortness of breath.  She has not had any prolonged episodes of chest discomfort, and she has not had to take any nitroglycerin.  She reports stable symptoms of exertional shortness of breath.  She denies resting shortness of breath, PND, orthopnea, or lower extremity edema.  She has not had palpitations nor syncope.  RESPIRATORY:  Notable in that the patient continues to smoke right up until hospitalization.  She denies productive cough, hemoptysis, wheezing.  She has stable exertional shortness of breath.  GASTROINTESTINAL:  Notable for some constipation, which is not problematic.  She has no difficulty swallowing.  She denies hematochezia, hematemesis, melena.  NEUROLOGIC:  Notable in that the patient describes symptoms suggestive of right-sided amaurosis fugax. She states that for probably 4 months or so, she has had transient visual disturbances involving only the right eye, which she loses eyesight and occasionally seems as though shade is coming down on the right side.  It does not occur on the left side.  These symptoms only last a few minutes and waxes and wanes sporadically and seems to come and go sporadically.  She has chronic pain and numbness in both lower extremities, much more so on the left than the right.  This is not transient nor episodic.  She denies any recent history of transient numbness or weakness involving either upper extremity.  She states  that she still has a tendency to dysphagia and/or dysarthria when she gets fatigued or if she is very tired.  This dates back to her original stroke in 2010, and is stable.  GENITOURINARY:  Negative.  INFECTIOUS: Negative.  HEENT:  Negative.  PAST MEDICAL HISTORY: 1. Coronary artery disease. 2. Cerebrovascular disease, status post left hemispheric stroke,     October 2010. 3. Hypertension. 4. Hyperlipidemia. 5. Type 2 diabetes mellitus. 6. Diabetic neuropathy. 7. Peripheral vascular disease. 8. Longstanding tobacco abuse. 9. History of narcotic abuse. 10.History of chronic mastitis.  PAST SURGICAL HISTORY:  Single vessel off-pump coronary artery bypass grafting, vein graft to the posterior descending coronary artery, January 2011.  FAMILY HISTORY:  Notable for strong presence of coronary artery disease and diabetes.  SOCIAL HISTORY:  The patient is married and lives with her husband and daughter in [place].  She continues to smoke.  She denies any recent alcohol or drug use.  CURRENT MEDICATIONS: 1. Aspirin 81 mg daily. 2. Lopressor 100 mg twice daily. 3. Lisinopril 20 mg twice daily. 4. Plavix 75 mg daily.  5. Insulin. 6. Hydrochlorothiazide 12.5 mg daily. 7. Verapamil SR 180 mg twice daily. 8. Ranexa 500 mg twice daily. 9. Lexapro. 10.Crestor 5 mg daily. 11.Imdur 30 mg daily. 12.Omeprazole 20 mg daily.  DRUG ALLERGIES:  IV CONTRAST, AUGMENTIN, and STATIN INTOLERANCE.  PHYSICAL EXAMINATION:  GENERAL:  The patient is mildly obese female, appears his stated age, in no acute distress.  She is currently in sinus rhythm.  HEENT:  Unrevealing. NECK:  Supple.  There is no cervical nor supraclavicular lymphadenopathy.  There are bilateral carotid bruits. CHEST:  Auscultation of the chest reveals clear breath sounds anteriorly.  There is a well-healed median sternotomy scar. CARDIOVASCULAR:  Notable for regular rate and rhythm.  No murmurs, rubs, or gallops are  noted. ABDOMEN:  Soft, nondistended, nontender.  Bowel sounds are present. EXTREMITIES:  Warm and well perfused.  There is no lower extremity edema.  Distal pulses are not palpable in either lower leg at the ankle. There is a well-healed surgical scar just above the right knee from endoscopic vein harvest on the right thigh.  There is no sign of venous insufficiency. RECTAL:  Deferred. GU:  Deferred. NEUROLOGIC:  Grossly nonfocal, symmetrical throughout.  DIAGNOSTIC TEST:  Cardiac catheterization performed by Dr. Allyson Sabal earlier today was reviewed and compared with previous catheterization films. This demonstrates progression of left main coronary artery stenosis in my estimation now, approximately 70-80% tapering tubular stenosis of the distal left main coronary artery with 80% ostial left circumflex coronary artery stenosis.  There is 95% stenosis of the mid-left circumflex coronary artery in-stent involving the proximal end of the stent in the left circumflex vessel.  Terminal branch of left circumflex beyond this is small and diffusely diseased and may not be graftable. Left anterior descending coronary artery is a large vessel that is for the most part, free of any significant disease.  There is a long segment 95% stenosis of the right coronary artery.  There is patent vein graft to the posterior descending coronary artery.  There is subtotal occlusion of the distal posterior descending coronary artery beyond insertion of a vein graft.  The sternal portion vessel may not be graftable.  Left ventricular function appears normal with no significant wall motion abnormalities.  Cerebral arteriogram performed at the same sitting is reviewed.  This demonstrates critical ulcerated high-grade 85-90% stenosis of the right internal carotid artery with 99% stenosis of the right external carotid artery at the bifurcation.  On the left side, the stent in the internal carotid artery is patent,  but severely diseased with probable 80% proximal stenosis.  IMPRESSION:  Ms. Fritsch has had progression of native coronary artery disease, now with likely hemodynamically significant left main stenosis. She has high-grade disease in the left circumflex coronary artery that is probably her culprit for her recent development of symptoms.  Left ventricular function is well preserved.  The terminal branches of the left circumflex coronary artery and the terminal branch of the posterior descending coronary artery are diffusely diseased and may not be graftable.  However, I do think she would benefit from redo coronary artery bypass grafting at the very least for placement of left internal mammary artery graft to the left anterior descending coronary artery because of the presence of likely significant left main coronary artery stenosis.  However, Ms. Massmann also has severe bilateral internal carotid artery disease with ongoing symptoms of right-sided amaurosis fugax. She has had previous stroke on the left, and she has had significant restenosis on the left internal  carotid artery as well.  Under these circumstances, I favor right carotid endarterectomy as soon as practical with delayed staged redo coronary artery bypass grafting during the same hospitalization.  The left carotid artery could be addressed at a later date.  I do not favor use of a combined procedure with right carotid endarterectomy and redo coronary artery bypass grafting due to the presence of severe bilateral cerebrovascular disease with history of previous stroke and significant risk of neurologic event.  Furthermore, her coronary anatomy is such that although she may have unstable angina during this admission, I do not feel that she would likely be at risk for a massive myocardial infarction as her left main coronary artery stenosis is not that critical.  PLAN:  I have discussed matters at length with Ms. Durfey and her  family at the bedside here in the cath lab holding area.  I have discussed matters with Dr. Allyson Sabal and Dr. Myra Gianotti from the Vascular Surgical Team. We will continue to follow along.  We will go ahead and stop Plavix in anticipation of redo coronary artery bypass grafting.     Salvatore Decent. Cornelius Moras, M.D.     CHO/MEDQ  D:  02/03/2011  T:  02/03/2011  Job:  [id number]  cc:   Jorge Ny, MD Antoine Primas, DO  Electronically Signed by Tressie Stalker M.D. on 02/03/2011 02:57:01 PM

## 2011-02-04 ENCOUNTER — Other Ambulatory Visit: Payer: Self-pay | Admitting: Vascular Surgery

## 2011-02-04 DIAGNOSIS — I6529 Occlusion and stenosis of unspecified carotid artery: Secondary | ICD-10-CM

## 2011-02-04 LAB — HEMOGLOBIN A1C: Hgb A1c MFr Bld: 11.9 % — ABNORMAL HIGH (ref <5.7)

## 2011-02-04 LAB — LIPID PANEL
Cholesterol: 284 mg/dL — ABNORMAL HIGH (ref 0–200)
LDL Cholesterol: UNDETERMINED mg/dL (ref 0–99)
VLDL: UNDETERMINED mg/dL (ref 0–40)

## 2011-02-04 LAB — CBC
HCT: 32.8 % — ABNORMAL LOW (ref 36.0–46.0)
MCH: 28.3 pg (ref 26.0–34.0)
MCH: 28.5 pg (ref 26.0–34.0)
MCHC: 33.7 g/dL (ref 30.0–36.0)
MCV: 84.3 fL (ref 78.0–100.0)
Platelets: 307 10*3/uL (ref 150–400)
Platelets: 315 10*3/uL (ref 150–400)
RBC: 3.89 MIL/uL (ref 3.87–5.11)

## 2011-02-04 LAB — BASIC METABOLIC PANEL
CO2: 25 mEq/L (ref 19–32)
Calcium: 9.2 mg/dL (ref 8.4–10.5)
Chloride: 105 mEq/L (ref 96–112)
Creatinine, Ser: 0.62 mg/dL (ref 0.4–1.2)
Creatinine, Ser: 0.52 mg/dL (ref 0.4–1.2)
GFR calc Af Amer: >60 mL/min (ref >60)
GFR calc Af Amer: >60 mL/min (ref >60)
Potassium: 3.2 mEq/L — ABNORMAL LOW (ref 3.5–5.1)

## 2011-02-04 LAB — TYPE AND SCREEN
ABO/RH(D): A NEG
Antibody Screen: NEGATIVE

## 2011-02-04 LAB — GLUCOSE, CAPILLARY: Glucose-Capillary: 272 mg/dL — ABNORMAL HIGH (ref 70–99)

## 2011-02-04 NOTE — Consult Note (Signed)
  [name], [name]               ACCOUNT NO.:  [id number]  MEDICAL RECORD NO.:  [id number]           PATIENT TYPE:  I  LOCATION:  2926                         FACILITY:  [facility]  PHYSICIAN:  Annita Ratliff K. Alyne Martinson, M.D.DATE OF BIRTH:  Mar 18, 1961  DATE OF CONSULTATION: DATE OF DISCHARGE:                                CONSULTATION   CLINICAL HISTORY:  The patient with extracranial cerebrovascular disease.  Previous history of carotid stenting on the left side.  EXAMINATION:  Bilateral carotid arteriograms.  The right common carotidarteriogram demonstrates right internal carotid artery at the cranial skull base to be widely patent.  The petrous segment is of normal caliber.  There is approximately 50% stenosis involving the proximal cavernous segment of the right internal carotid artery.  Distal to this, the supraclinoid segment is normal.  The right middle and the right anterior cerebral arteries are seen to opacify normally into capillary and venous phases.  The left common carotid arteriogram demonstrates the  left internal carotid artery at the skull base to be widely patent.  There is a focal segmental fusiform prominence of the proximal cavernous segment of the left internal carotid artery.  The distal cavernous segment and the supraclinoid segments are normally opacified.  The left middle and left anterior cerebral arteries are seen to opacify normally into the capillary and  venous phases.  IMPRESSION: 1. Approximately 50% stenosis of the right internal carotid artery of  the     proximal cavernous segment. 2. Mild fusiform prominence of the proximal cavernous segment of the     left internal carotid artery.          ______________________________ Grandville Silos Corliss Skains, M.D.     SKD/MEDQ  D:  02/03/2011  T:  02/04/2011  Job:  [id number]  Electronically Signed by Julieanne Cotton M.D. on 02/04/2011 08:45:13 AM

## 2011-02-05 LAB — BASIC METABOLIC PANEL
BUN: 10 mg/dL (ref 6–23)
CO2: 29 mEq/L (ref 19–32)
Calcium: 8.2 mg/dL — ABNORMAL LOW (ref 8.4–10.5)
Chloride: 101 mEq/L (ref 96–112)
Creatinine, Ser: <0.47 mg/dL (ref 0.4–1.2)
Glucose, Bld: 219 mg/dL — ABNORMAL HIGH (ref 70–99)

## 2011-02-05 LAB — GLUCOSE, CAPILLARY
Glucose-Capillary: 241 mg/dL — ABNORMAL HIGH (ref 70–99)
Glucose-Capillary: 188 mg/dL — ABNORMAL HIGH (ref 70–99)

## 2011-02-05 LAB — CBC
HCT: 33.5 % — ABNORMAL LOW (ref 36.0–46.0)
Hemoglobin: 11.1 g/dL — ABNORMAL LOW (ref 12.0–15.0)
MCH: 28.2 pg (ref 26.0–34.0)
MCHC: 33.1 g/dL (ref 30.0–36.0)
MCV: 85 fL (ref 78.0–100.0)

## 2011-02-06 LAB — BASIC METABOLIC PANEL
CO2: 28 mEq/L (ref 19–32)
GFR calc Af Amer: >60 mL/min (ref >60)
GFR calc non Af Amer: >60 mL/min (ref >60)
Glucose, Bld: 74 mg/dL (ref 70–99)
Potassium: 3.6 mEq/L (ref 3.5–5.1)
Sodium: 138 mEq/L (ref 135–145)

## 2011-02-06 LAB — GLUCOSE, CAPILLARY
Glucose-Capillary: 188 mg/dL — ABNORMAL HIGH (ref 70–99)
Glucose-Capillary: 70 mg/dL (ref 70–99)

## 2011-02-07 LAB — GLUCOSE, CAPILLARY
Glucose-Capillary: 88 mg/dL (ref 70–99)
Glucose-Capillary: 150 mg/dL — ABNORMAL HIGH (ref 70–99)
Glucose-Capillary: 133 mg/dL — ABNORMAL HIGH (ref 70–99)

## 2011-02-08 DIAGNOSIS — I2510 Atherosclerotic heart disease of native coronary artery without angina pectoris: Secondary | ICD-10-CM

## 2011-02-08 DIAGNOSIS — Z01810 Encounter for preprocedural cardiovascular examination: Secondary | ICD-10-CM

## 2011-02-08 LAB — URINALYSIS, ROUTINE W REFLEX MICROSCOPIC
Bilirubin Urine: NEGATIVE
Glucose, UA: NEGATIVE mg/dL
Hgb urine dipstick: NEGATIVE
Ketones, ur: NEGATIVE mg/dL
Protein, ur: NEGATIVE mg/dL
Urobilinogen, UA: 0.2 mg/dL (ref 0.0–1.0)

## 2011-02-08 LAB — GLUCOSE, CAPILLARY
Glucose-Capillary: 52 mg/dL — ABNORMAL LOW (ref 70–99)
Glucose-Capillary: 68 mg/dL — ABNORMAL LOW (ref 70–99)
Glucose-Capillary: 70 mg/dL (ref 70–99)
Glucose-Capillary: 116 mg/dL — ABNORMAL HIGH (ref 70–99)

## 2011-02-08 NOTE — Op Note (Signed)
[name], [name]               ACCOUNT NO.:  [id number]  MEDICAL RECORD NO.:  [id number]           PATIENT TYPE:  I  LOCATION:  2926                         FACILITY:  [facility]  PHYSICIAN:  Quita Skye. Hart Rochester, M.D.  DATE OF BIRTH:  [birth year]  DATE OF PROCEDURE:  02/04/2011 DATE OF DISCHARGE:                              OPERATIVE REPORT   PREOPERATIVE DIAGNOSES:  Severe right internal carotid stenosis with history of amaurosis fugax, right eye and severe recurrent coronary artery disease.  POSTOPERATIVE DIAGNOSES:  Severe right internal carotid stenosis with history of amaurosis fugax, right eye and severe recurrent coronary artery disease.  OPERATIONS:  Right carotid endarterectomy with Dacron patch angioplasty.  SURGEON:  Quita Skye. Hart Rochester, MD  FIRST ASSISTANT:  Larina Earthly, MD  ANESTHESIA:  General endotracheal.  BRIEF HISTORY:  This patient is being evaluated for redo coronary artery bypass grafting by Dr. Cornelius [name] and upon questioning she was found to have symptoms of amaurosis fugax in the right eye dating back 3-4 months with multiple episodes.  She was found on CT angiogram to have a focal 80-90% right internal carotid stenosis and recurrent stenosis at the proximal aspect of a carotid stent which had been placed on the left side approximately 80% in severity.  She was scheduled initially for right carotid endarterectomy to be followed by redo coronary artery bypass grafting by Dr. Cornelius [name].  PROCEDURE:  The patient was taken to the operating room, placed in supine position at which time satisfactory general endotracheal anesthesia was administered.  Right neck was prepped. Betadine scrub and solution draped in routine sterile manner.  Incision was made along the anterior border of sternocleidomastoid muscle and carried down through subcutaneous tissue and platysma using the Bovie.  Common facial vein and external jugular veins ligated with 3-0 silk ties and  divided exposing the common internal and external carotid arteries.  Care was taken not to injure the vagus or hypoglossal nerves, both of which were exposed.  There was a calcified atherosclerotic plaque at the carotid bifurcation extending up the internal carotid posteriorly about 3-4 cm and well down the common carotid below the crossing of the omohyoid muscle.  After dissecting this out completely, #10 shunt was prepared and the patient was heparinized.  Carotid vessels were occluded with vascular clamps, longitudinal opening made in the common carotid with #15 blade extending up the internal carotid with Potts scissors to a point distal to the disease.  The plaque was about 80% stenotic in severity.  A #10 shunt was inserted without difficulty reestablishing flow in about 2 minutes.  Standard endarterectomy was then performed using the elevator and Potts scissors with eversion endarterectomy of the external carotid.  Plaque feathered off distal internal carotid artery nicely not requiring any tacking sutures.  Lumen was thoroughly irrigated with heparin saline.  All loose debris carefully removed and arteriotomy was closed with a long patch using continuous 6-0 Prolene. Prior to completion of closure, the shunt was removed after about 45 minutes of shunt time.  Following antegrade and retrograde flushing, closure was completed reestablishing flow initially up the external and internal branch.  Carotid was occluded for less than 2 minutes for removal of shunt.  Protamine was then given to reverse the heparin.  Following adequate hemostasis, the wound was irrigated with saline, closed in layers with Vicryl in subcuticular fashion.  Sterile dressing applied.  The patient was taken to recovery room in satisfactory condition.     Quita Skye Hart Rochester, M.D.     JDL/MEDQ  D:  02/04/2011  T:  02/05/2011  Job:  [id number]  Electronically Signed by Josephina Gip M.D. on 02/08/2011 09:35:05  AM

## 2011-02-09 ENCOUNTER — Inpatient Hospital Stay (HOSPITAL_COMMUNITY): Payer: Medicaid Other

## 2011-02-09 DIAGNOSIS — I2510 Atherosclerotic heart disease of native coronary artery without angina pectoris: Secondary | ICD-10-CM

## 2011-02-09 HISTORY — PX: CORONARY ARTERY BYPASS GRAFT: SHX141

## 2011-02-09 LAB — POCT I-STAT 4, (NA,K, GLUC, HGB,HCT)
Glucose, Bld: 90 mg/dL (ref 70–99)
Glucose, Bld: 67 mg/dL — ABNORMAL LOW (ref 70–99)
Glucose, Bld: 109 mg/dL — ABNORMAL HIGH (ref 70–99)
Glucose, Bld: 124 mg/dL — ABNORMAL HIGH (ref 70–99)
HCT: 28 % — ABNORMAL LOW (ref 36.0–46.0)
HCT: 21 % — ABNORMAL LOW (ref 36.0–46.0)
HCT: 22 % — ABNORMAL LOW (ref 36.0–46.0)
HCT: 27 % — ABNORMAL LOW (ref 36.0–46.0)
Hemoglobin: 7.1 g/dL — ABNORMAL LOW (ref 12.0–15.0)
Hemoglobin: 9.2 g/dL — ABNORMAL LOW (ref 12.0–15.0)
Potassium: 3.4 mEq/L — ABNORMAL LOW (ref 3.5–5.1)
Potassium: 3.9 mEq/L (ref 3.5–5.1)
Sodium: 135 mEq/L (ref 135–145)
Sodium: 138 mEq/L (ref 135–145)

## 2011-02-09 LAB — POCT I-STAT, CHEM 8
Calcium, Ion: 1.16 mmol/L (ref 1.12–1.32)
Glucose, Bld: 134 mg/dL — ABNORMAL HIGH (ref 70–99)
HCT: 25 % — ABNORMAL LOW (ref 36.0–46.0)
Hemoglobin: 8.5 g/dL — ABNORMAL LOW (ref 12.0–15.0)
Potassium: 4.5 mEq/L (ref 3.5–5.1)
TCO2: 23 mmol/L (ref 0–100)

## 2011-02-09 LAB — CBC
HCT: 26.7 % — ABNORMAL LOW (ref 36.0–46.0)
HCT: 26.3 % — ABNORMAL LOW (ref 36.0–46.0)
Hemoglobin: 10.6 g/dL — ABNORMAL LOW (ref 12.0–15.0)
Hemoglobin: 8.7 g/dL — ABNORMAL LOW (ref 12.0–15.0)
MCH: 28.6 pg (ref 26.0–34.0)
MCH: 28.5 pg (ref 26.0–34.0)
MCHC: 32.6 g/dL (ref 30.0–36.0)
MCHC: 33 g/dL (ref 30.0–36.0)
MCHC: 33.1 g/dL (ref 30.0–36.0)
MCV: 86.2 fL (ref 78.0–100.0)
RDW: 12.7 % (ref 11.5–15.5)
RDW: 12.5 % (ref 11.5–15.5)
RDW: 12.7 % (ref 11.5–15.5)
WBC: 8 10*3/uL (ref 4.0–10.5)

## 2011-02-09 LAB — BLOOD GAS, ARTERIAL
Bicarbonate: 26.5 mEq/L — ABNORMAL HIGH (ref 20.0–24.0)
FIO2: 0.21 %
Patient temperature: 98.6
TCO2: 27.7 mmol/L (ref 0–100)
pCO2 arterial: 39.8 mmHg (ref 35.0–45.0)
pH, Arterial: 7.439 — ABNORMAL HIGH (ref 7.350–7.400)

## 2011-02-09 LAB — POCT I-STAT GLUCOSE
Glucose, Bld: 72 mg/dL (ref 70–99)
Operator id: 235871

## 2011-02-09 LAB — PROTIME-INR: INR: 1.31 (ref 0.00–1.49)

## 2011-02-09 LAB — POCT I-STAT 3, ART BLOOD GAS (G3+)
Acid-base deficit: 1 mmol/L (ref 0.0–2.0)
Bicarbonate: 23.8 mEq/L (ref 20.0–24.0)
O2 Saturation: 97 %
Patient temperature: 36.4
Patient temperature: 37.8
TCO2: 26 mmol/L (ref 0–100)
pH, Arterial: 7.471 — ABNORMAL HIGH (ref 7.350–7.400)
pO2, Arterial: 91 mmHg (ref 80.0–100.0)

## 2011-02-09 LAB — COMPREHENSIVE METABOLIC PANEL
Albumin: 2.7 g/dL — ABNORMAL LOW (ref 3.5–5.2)
Alkaline Phosphatase: 155 U/L — ABNORMAL HIGH (ref 39–117)
BUN: 17 mg/dL (ref 6–23)
Chloride: 102 mEq/L (ref 96–112)
Potassium: 3.5 mEq/L (ref 3.5–5.1)
Total Bilirubin: 0.1 mg/dL — ABNORMAL LOW (ref 0.3–1.2)

## 2011-02-09 LAB — HEMOGLOBIN AND HEMATOCRIT, BLOOD: HCT: 21.5 % — ABNORMAL LOW (ref 36.0–46.0)

## 2011-02-09 LAB — GLUCOSE, CAPILLARY: Glucose-Capillary: 156 mg/dL — ABNORMAL HIGH (ref 70–99)

## 2011-02-10 ENCOUNTER — Inpatient Hospital Stay (HOSPITAL_COMMUNITY): Payer: Medicaid Other

## 2011-02-10 LAB — CBC
HCT: 24.8 % — ABNORMAL LOW (ref 36.0–46.0)
Hemoglobin: 8.2 g/dL — ABNORMAL LOW (ref 12.0–15.0)
MCH: 29.2 pg (ref 26.0–34.0)
MCHC: 33.1 g/dL (ref 30.0–36.0)
MCV: 86.1 fL (ref 78.0–100.0)
MCV: 88.3 fL (ref 78.0–100.0)
Platelets: 265 10*3/uL (ref 150–400)
RBC: 2.94 MIL/uL — ABNORMAL LOW (ref 3.87–5.11)
WBC: 10.4 10*3/uL (ref 4.0–10.5)

## 2011-02-10 LAB — POCT I-STAT, CHEM 8
Creatinine, Ser: 0.9 mg/dL (ref 0.4–1.2)
Glucose, Bld: 138 mg/dL — ABNORMAL HIGH (ref 70–99)
Hemoglobin: 9.9 g/dL — ABNORMAL LOW (ref 12.0–15.0)
TCO2: 25 mmol/L (ref 0–100)

## 2011-02-10 LAB — GLUCOSE, CAPILLARY
Glucose-Capillary: 123 mg/dL — ABNORMAL HIGH (ref 70–99)
Glucose-Capillary: 113 mg/dL — ABNORMAL HIGH (ref 70–99)
Glucose-Capillary: 149 mg/dL — ABNORMAL HIGH (ref 70–99)
Glucose-Capillary: 81 mg/dL (ref 70–99)
Glucose-Capillary: 84 mg/dL (ref 70–99)

## 2011-02-10 LAB — BASIC METABOLIC PANEL
BUN: 13 mg/dL (ref 6–23)
Chloride: 105 mEq/L (ref 96–112)
GFR calc Af Amer: >60 mL/min (ref >60)
Potassium: 3.7 mEq/L (ref 3.5–5.1)

## 2011-02-10 LAB — MAGNESIUM: Magnesium: 2.5 mg/dL (ref 1.5–2.5)

## 2011-02-10 NOTE — Consult Note (Signed)
  NAMEALANDRA, [name]               ACCOUNT NO.:  [id number]  MEDICAL RECORD NO.:  [id number]           PATIENT TYPE:  I  LOCATION:  2926                         FACILITY:  [facility]  PHYSICIAN:  Fransisco Hertz, MD       DATE OF BIRTH:  Dec 24, 1960  DATE OF CONSULTATION: DATE OF DISCHARGE:                                CONSULTATION   ADDENDUM:  MEDICAL DECISION-MAKING:  There was a discussion between Cardiothoracic Surgery, Cardiology, and a Vascular Surgery in regard how to proceed in this patient's case.  The feeling per the cardiothoracic surgeon is that with a complicated redo sternotomy and redo CABG, he did not feel that a combined carotid CABG procedure would be indicated and she would be better off with proceeding with carotid endarterectomy first for symptomatic right internal carotid artery stenosis greater than 70%. Based on the review of the data in this occasion, I agree that she does have an indication for right carotid endarterectomy and per the discussion with all the other attendings in this case, we will proceed forward with scheduling this patient for a right carotid endarterectomy tomorrow.  Dr. Hart Rochester is likely to be the surgeon that will be taking care of this procedure.  I discussed over a 30-minute period, the nature of right carotid endarterectomy, risks, benefits and possible alternatives.  As I explained to her in this case, there is not a carotid stent option given the heavy calcification she has in this lesion as there was a risk of embolization and also failure to fully expand in this type of situation.  Subsequently, her options become either maximal medical management or carotid endarterectomy.  The risks of carotid endarterectomy we discussed include but are not limited to bleeding, infection, possible cranial nerve injury, possible stroke, possible MI, possible death, and possible neck hematoma, and resulting airway compromise.  She is aware of these  risks and has tentatively agreed to proceed forward such.  We will have her scheduled for tomorrow.     Fransisco Hertz, MD     BLC/MEDQ  D:  02/03/2011  T:  02/04/2011  Job:  [id number]  Electronically Signed by Leonides Sake MD on 02/10/2011 07:33:27 AM

## 2011-02-10 NOTE — Consult Note (Signed)
NAMEJOLIANA, [name]               ACCOUNT NO.:  [id number]  MEDICAL RECORD NO.:  [id number]           PATIENT TYPE:  I  LOCATION:  2926                         FACILITY:  [facility]  PHYSICIAN:  Fransisco Hertz, MD       DATE OF BIRTH:  03/21/1961  DATE OF CONSULTATION:  02/03/2011 DATE OF DISCHARGE:                                CONSULTATION   Consultation was requested by Dr. Allyson Sabal.  REASON FOR CONSULTATION:  Right symptomatic internal carotid artery stenosis.  HISTORY OF PRESENT ILLNESS:  This is a 50 year old patient with previous significant history of peripheral arterial disease and coronary artery disease, status post a previous CABG.  She presents with a chief complaint today of chest pain.  She was taken to the Cardiac Catheterization Lab today due to multiple-week history of chest pain significant enough to wake her up at night.  She has in her previous followups with Dr. Allyson Sabal had recognized residual disease in her heart, so she also has well known carotid artery disease, previously requiring a left internal carotid artery stenosis stenting.  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 She did not have amaurosis fugax per classical description.  She notes also episodes of spasm in her right eyelid during these episodes of monocular blindness, these have always resolved shortly after their recurrence.  She denies any motor symptomatology on either side.  She has had no facial drooping.  She never had any expressive aphasia, but from her previous left-sided stroke, she notes occasionally she does have receptive aphasia.  Additionally, she notes when she is very fatigue, there is some slight asymmetry in her motor strength with some right- sided weakness consistent with her immediate post left-sided CVA symptomatology.  PAST MEDICAL HISTORY: 1. Hypertension. 2. Hyperlipidemia. 3. Insulin-dependent diabetes. 4. Previous  CVA. 5. Peripheral arterial disease. 6. Coronary artery disease. 7. Narcotic and anxiolytic abuse.  PAST SURGICAL HISTORY:  Stenting of her left internal carotid artery, stenting of bilateral iliacs and single vessel CABG to her distal RCA.  SOCIAL HISTORY:  She has a history of tobacco abuse, but quit 3 months ago.  No alcohol or illicit drug use, though there is a distant history opioid and anxiolytic abuse.  PAST SURGICAL HISTORY:  Significant for coronary artery disease, diabetes, lung cancer, and bipolar disorder.  REVIEW OF SYSTEMS:  As listed above, otherwise noted to be negative.  MEDICATIONS:  Aspirin, Lopressor, lisinopril, Plavix, insulin, hydrochlorothiazide, verapamil, Ranexa, Lexapro, Crestor, Imdur and omeprazole.  ALLERGIES:  AUGMENTIN, CONTRAST DYE which was more of a intolerance and STATIN intolerance.  PHYSICAL EXAMINATION:  VITAL SIGNS:  Last temperature was 98.3, current blood pressure 118/74, heart rate of 86, respirations of 16, satting 99% on room air. GENERAL:  Well developed, well nourished.  No apparent distress, alert and oriented x3. HEAD:  Normocephalic, atraumatic. ENT:  Hearing was grossly intact.  Oropharynx without any erythema or exudate.  The nares without any drainage or erythema. NECK:  Supple neck with no nuchal rigidity.  No JVD. EYE:  Pupils were equal, round, and  reactive to light.  Extraocular movements were intact. PULMONARY:  Symmetric expansion.  Good air movement. No rales, rhonchi, or wheezing. CARDIAC:  Regular rate and rhythm.  Normal S1 and S2.  No murmurs, rubs, thrills, or gallops. VASCULAR:  She has palpable brachial pulses.  The right radial was bandaged and she had a IV in the vicinity of the left radial, so I could not palpate it easily.  Bilateral carotids were palpable with no obvious bruits.  Due to her obesity, I could not feel her abdominal aorta. Bilateral femorals were palpable on the side of the cannulation.   There was a bandage here, but I can feel it distal to that bandage.  Both feet have easily palpable DPs and faintly palpable PTs. ABDOMEN:  Soft, nontender, nondistended.  No Guarding, no rebound, no hepatosplenomegaly.  Mildly obese and there was no obvious masses. MUSCULOSKELETAL:  She had 5/5 strength in all extremities.  There was no signs of any ischemia or gangrene or any type of ulcers in any extremity. NEUROLOGIC:  Cranial nerves II through XII were grossly intact.  Motor strength as listed above.  Sensation was grossly intact in all extremities. PSYCHIATRY:  Judgment was intact.  Mood and affect were appropriate for clinical situation. SKIN:  There were no rashes and her extremities were as listed above. LYMPHATIC:  No cervical, axillary, or inguinal lymphadenopathy.  The recent carotid angiogram was completed and I agree with Dr. Hazle Coca findings that there is a focal high-grade stenosis in the calcified lesion on the right easily greater than 70% by NASCET criteria.  On the left side also, there is a recurrent stenosis just proximal to the previous carotid stent in the common carotid artery.  She has had a chemistries; sodium 136, potassium 4.5, chloride 101, bicarb of 24, glucose was 480, BUN 23, creatinine was 0.73.  CBC; white count of 9.0, H and H 13.6 and 39.5, platelets of 305.  MEDICAL DECISION-MAKING:  This is a 50 year old patient now with evidence on her cardiac cath of significant disease progression.  She apparently is going to need a repeat CABG per discussion between Dr. Myra Gianotti, Dr. Allyson Sabal, and her cardiothoracic surgeon.  Dictation ended at this point due to phone malfunction (see addendum).     Fransisco Hertz, MD     BLC/MEDQ  D:  02/03/2011  T:  02/04/2011  Job:  [id number]  Electronically Signed by Leonides Sake MD on 02/10/2011 07:31:54 AM

## 2011-02-11 ENCOUNTER — Inpatient Hospital Stay (HOSPITAL_COMMUNITY): Payer: Medicaid Other

## 2011-02-11 LAB — GLUCOSE, CAPILLARY
Glucose-Capillary: 135 mg/dL — ABNORMAL HIGH (ref 70–99)
Glucose-Capillary: 135 mg/dL — ABNORMAL HIGH (ref 70–99)
Glucose-Capillary: 248 mg/dL — ABNORMAL HIGH (ref 70–99)
Glucose-Capillary: 90 mg/dL (ref 70–99)

## 2011-02-11 LAB — BASIC METABOLIC PANEL
BUN: 12 mg/dL (ref 6–23)
CO2: 30 mEq/L (ref 19–32)
Calcium: 8.6 mg/dL (ref 8.4–10.5)
Creatinine, Ser: 0.57 mg/dL (ref 0.4–1.2)
GFR calc Af Amer: >60 mL/min (ref >60)

## 2011-02-11 LAB — CBC
Hemoglobin: 8.5 g/dL — ABNORMAL LOW (ref 12.0–15.0)
MCH: 28.6 pg (ref 26.0–34.0)
MCHC: 32.4 g/dL (ref 30.0–36.0)
MCV: 88.2 fL (ref 78.0–100.0)

## 2011-02-11 NOTE — Op Note (Signed)
[name], [name]               ACCOUNT NO.:  [id number]  MEDICAL RECORD NO.:  [id number]           PATIENT TYPE:  LOCATION:                                 FACILITY:  PHYSICIAN:  [name], M.D. DATE OF BIRTH:  [birth year]  DATE OF PROCEDURE:  02/09/2011 DATE OF DISCHARGE:                              OPERATIVE REPORT   PREOPERATIVE DIAGNOSIS:  Left main disease with three-vessel coronary artery disease status post coronary artery bypass grafting in 2009.  POSTOPERATIVE DIAGNOSIS:  Left main disease with three-vessel coronary artery disease status post coronary artery bypass grafting in 2009.  PROCEDURE:  Redo median sternotomy for redo coronary artery bypass grafting x3 (left internal mammary artery to distal left anterior descending coronary artery, saphenous vein graft to second obtuse marginal branch of the left circumflex coronary artery, saphenous vein graft to posterior descending coronary artery, endoscopic saphenous vein harvest from left thigh).  SURGEON:  Andrea Decent. Cornelius Moras, MD  ASSISTANT:  Coral Ceo, PA  ANESTHESIOLOGIST:  Germaine Pomfret, MD  BRIEF CLINICAL NOTE:  The patient is a 50 year old obese white female with longstanding history of tobacco abuse and uncontrolled type 2 diabetes mellitus as well as history of coronary artery disease, cerebrovascular disease, hypertension, and hyperlipidemia.  The patient's full medical history has been documented on multiple times previously.  The patient has had multiple percutaneous coronary interventions as well as single-vessel coronary artery bypass grafting in 2009, at which time a reverse saphenous vein graft was placed in the posterior descending coronary artery for refractory symptoms of unstable angina with a single-vessel coronary artery disease.  The patient has suffered a stroke and undergone left internal carotid artery stenting in the past.  The patient has had recurrence of angina and has  undergone multiple PCI and stenting of the left circumflex coronary artery as well as the vein graft to the posterior descending coronary artery.  The patient now presents with accelerating symptoms of angina pectoris. Cardiac catheterization performed by Dr. Nanetta [name] demonstrates progression of left main coronary artery stenosis as well as severe three-vessel coronary artery disease.  The vein graft to the posterior descending coronary artery remains patent, but there is progression of disease in the native posterior descending coronary artery beyond the insertion of this graft.  Left ventricular function is preserved.  The patient was also noted to have severe bilateral internal carotid artery stenosis including restenosis of the stent in the left internal carotid artery as well as new development of symptomatic right internal carotid artery stenosis.  The patient underwent right carotid endarterectomy by Dr. Quita Skye. Hart Rochester on Feb 05, 2011, and has recovered from this procedure uneventfully.  The patient now presents for elective surgical revascularization.  The patient and her family have been counseled regarding the indications, risks, and potential benefits of surgery. Alternative treatment strategies have been discussed.  Long-term prognosis without smoking cessation and without adequate control of diabetes mellitus has been portrayed as harbinger of likely dismal long- term outcome.  Despite this, surgical revascularization is felt indicated under the circumstances and all of their questions have been addressed.  The patient  explicitly provides full informed consent.  OPERATIVE FINDINGS: 1. Normal left ventricular function. 2. Mild-to-moderate left ventricular hypertrophy. 3. Good-quality left internal mammary artery and saphenous vein     conduit for grafting. 4. Small-caliber diffusely diseased coronary arteries with relatively     poor target vessels for grafting. 5.  This patient would not be a candidate for repeat surgical     revascularization in the future.  OPERATIVE PROCEDURE IN DETAIL:  The patient was brought to the operating room on above-mentioned date and central monitoring was established by the anesthesia team under the care and direction of Dr. Jairo Ben.  Specifically, a Swan-Ganz catheter was placed through the left internal jugular approach.  A radial arterial line was placed. Intravenous antibiotics were administered.  The patient was placed in the supine position on the operating table.  General endotracheal anesthesia was induced uneventfully.  A Foley catheter was placed. Baseline transesophageal echocardiogram was performed by Dr. Jean Rosenthal. This demonstrates normal left ventricular systolic function.  No other significant abnormalities are noted.  The greater saphenous vein was removed from the patient's left thigh using endoscopic vein harvest technique through a small incision made just above the left knee.  A saphenous vein was notably good-quality conduit.  After the saphenous vein had been removed, the small incision in the left lower extremity was closed with absorbable suture.  Redo median sternotomy incision was performed.  The sternal wires were removed and the sternum was divided using an oscillating saw.  Sternal reentry was uneventful.  Sharp dissection was performed to dissect away the undersurface of the sternum from the anterior mediastinum.  The left internal mammary artery was now dissected from the chest wall and prepared for bypass grafting.  The left internal mammary artery was good- quality conduit.  A retractor was placed and sharp dissection was continued to identify the ascending aorta.  No-touch technique was performed with minimal dissection until after aortic cross-clamping had been deformed performed.  Just enough aorta was identified for cannulation.  The left common femoral vein was  cannulated with a Seldinger technique and long flexible guidewire was advanced up through the inferior vena cava through the right atrium until the guidewire passed up the superior vena cava using transesophageal echocardiogram for guidance.  The femoral vein was dilated with serial dilators, and after systemic heparinization, a 22-French long femoral venous cannula was passed over the guidewire with transesophageal echocardiogram guidance.  The ascending aorta was cannulated for cardiopulmonary bypass. Cardiopulmonary bypass was begun.  Vacuum-assist venous drainage was utilized.  Venous drainage and exposure was excellent.  Sharp dissection was now continued until the pericardial edges were identified and silk traction sutures were placed.  Dissection was continued until the aorta was encircled proximally and an antegrade cardioplegic cannula was placed in the proximal ascending aorta.  A temperature probe was placed in the interventricular septum.  The patient was cooled to 32 degrees systemic temperature.  The aortic cross-clamp was applied and cold blood cardioplegia was delivered in antegrade fashion through the aortic root. Iced saline slush was applied for topical hypothermia.  The initial cardioplegic arrest was rapid with early diastolic arrest.  Repeat doses of cardioplegia were administered intermittently throughout the entire cross-clamp portion of the operation either through the aortic root or down the subsequently placed vein grafts to maintain completely flat electrocardiogram and myocardial temperature below 15 degrees centigrade.  Myocardial protection was felt to be excellent.  Sharp dissection was now continued until the heart was dissected circumferentially  around the pericardium.  The patent vein graft to the right coronary system was identified and carefully dissected away from associated structures.  Once the heart had been completely mobilized, distal target  vessels were selected for coronary bypass grafting.  The following distal coronary anastomoses were performed: 1. The second obtuse marginal branch of the left circumflex coronary     artery was grafted with a saphenous vein graft in end-to-side     fashion.  This vessel measured 1.0 mm in diameter at the site of     distal grafting and was a fair to poor-quality target vessel. 2. The posterior descending coronary artery was grafted with a     saphenous vein graft in end-to-side fashion.  This distal     anastomoses was placed immediately distal to the previous vein     graft anastomoses from the patient's original surgery.  After     opening the posterior descending coronary artery, the arteriotomy     was extended backwards across the distal pole of the old     anastomoses onto the old patent vein graft.  A 1.0 probe will     barely pass in the distal target vessel which was quite small and     diffusely diseased. 3. The distal left anterior descending coronary artery was grafted     with left internal mammary artery in end-to-side fashion.  This     vessel measured 1.4 mm in diameter and was a fair-quality target     vessel for grafting.  Both proximal saphenous vein anastomoses were performed directly to the ascending aorta prior to removal of the aortic cross-clamp.  Left ventricular septal temperature rises rapidly with reperfusion of left internal mammary artery graft.  The aortic cross-clamp was removed after a total cross-clamp time of 72 minutes.  The heart began to beat spontaneously without need for cardioversion.  All proximal and distal coronary anastomoses were inspected for hemostasis in appropriate graft orientation.  Epicardial pacing wires were fixed to the right ventricular free wall into the right atrial appendage.  The patient was rewarmed to 37 degrees centigrade temperature.  The patient was weaned from cardiopulmonary bypass without difficulty.  The patient's  rhythm at separation from bypass was normal sinus rhythm.  Atrial pacing was employed to increase heart rate.  Total cardiopulmonary bypass time for the operation was 98 minutes.  Followup transesophageal echocardiogram performed by Dr. Jean Rosenthal after separation from bypass demonstrated normal left ventricular function with no other abnormalities.  No inotropic support was required.  Protamine was administered to reverse the anticoagulation.  The aortic cannula was removed uneventfully.  The femoral venous cannula was removed and manual pressure was held on the left groin for 30 minutes. The mediastinum was irrigated with saline solution.  Meticulous surgical hemostasis was ascertained.  The mediastinum and both left and right pleural spaces were drained using a combination of four chest tubes placed through separate stab incisions inferiorly.  The sternum was closed using double-strength sternal wire.  The soft tissues anterior to the sternum were closed in multiple layers and the skin was closed using running subcuticular skin closure.  The patient tolerated the procedure well and was transported directly to the surgical intensive care unit in stable condition.  There were no intraoperative complications.  All sponge, instrument, and needle counts were verified correct at completion of the operation.  No blood products were administered.  Signature Purcell Nails, MD carbon copy Dr. Nanetta [name] carbon copy Dr.  Antoine Primas.  Carbon copy Dr. Anner Crete breath with carbon copy Dr. Joella Prince H. Cornelius [name], M.D.     CHO/MEDQ  D:  02/09/2011  T:  02/10/2011  Job:  [id number]  cc:   [name], M.D. Antoine Primas, DO V. Charlena Cross, MD Quita Skye. Hart Rochester, M.D.  Electronically Signed by Tressie Stalker M.D. on 02/11/2011 07:36:57 AM

## 2011-02-12 ENCOUNTER — Inpatient Hospital Stay (HOSPITAL_COMMUNITY): Payer: Medicaid Other

## 2011-02-12 LAB — TYPE AND SCREEN
Antibody Screen: NEGATIVE
Unit division: 0
Unit division: 0

## 2011-02-12 LAB — BASIC METABOLIC PANEL
BUN: 15 mg/dL (ref 6–23)
Chloride: 101 mEq/L (ref 96–112)
Glucose, Bld: 97 mg/dL (ref 70–99)
Potassium: 4 mEq/L (ref 3.5–5.1)
Sodium: 140 mEq/L (ref 135–145)

## 2011-02-12 LAB — CBC
HCT: 27.2 % — ABNORMAL LOW (ref 36.0–46.0)
Hemoglobin: 8.7 g/dL — ABNORMAL LOW (ref 12.0–15.0)
MCV: 88.3 fL (ref 78.0–100.0)
RBC: 3.08 MIL/uL — ABNORMAL LOW (ref 3.87–5.11)
WBC: 9.4 10*3/uL (ref 4.0–10.5)

## 2011-02-12 LAB — GLUCOSE, CAPILLARY: Glucose-Capillary: 159 mg/dL — ABNORMAL HIGH (ref 70–99)

## 2011-02-13 LAB — GLUCOSE, CAPILLARY
Glucose-Capillary: 97 mg/dL (ref 70–99)
Glucose-Capillary: 178 mg/dL — ABNORMAL HIGH (ref 70–99)
Glucose-Capillary: 137 mg/dL — ABNORMAL HIGH (ref 70–99)

## 2011-02-14 ENCOUNTER — Telehealth: Payer: Self-pay | Admitting: Family Medicine

## 2011-02-14 LAB — GLUCOSE, CAPILLARY
Glucose-Capillary: 78 mg/dL (ref 70–99)
Glucose-Capillary: 89 mg/dL (ref 70–99)
Glucose-Capillary: 99 mg/dL (ref 70–99)

## 2011-02-14 NOTE — Telephone Encounter (Signed)
Ok to double book after the 16th of June.

## 2011-02-14 NOTE — Telephone Encounter (Signed)
To Britta Mccreedy to Schedule

## 2011-02-14 NOTE — Telephone Encounter (Signed)
Ms. Milhouse need an appt with you for hosp f/u within 1-2 wks.  Please let front office know when we can get her in with you and we will call her for appt.

## 2011-02-14 NOTE — Telephone Encounter (Signed)
To MD

## 2011-02-15 LAB — GLUCOSE, CAPILLARY
Glucose-Capillary: 71 mg/dL (ref 70–99)
Glucose-Capillary: 104 mg/dL — ABNORMAL HIGH (ref 70–99)

## 2011-02-15 NOTE — Discharge Summary (Signed)
[name], [name]               ACCOUNT NO.:  [id number]  MEDICAL RECORD NO.:  [id number]  LOCATION:  2036                         FACILITY:  [facility]  PHYSICIAN:  Salvatore Decent. Cornelius Moras, M.D. DATE OF BIRTH:  1961/08/09  DATE OF ADMISSION:  02/03/2011 DATE OF DISCHARGE:  02/15/2011                              DISCHARGE SUMMARY   ADMITTING DIAGNOSES: 1. Severe right internal carotid artery stenosis. 2. History of coronary artery disease (status post percutaneous     coronary intervention with stent to the mid left circumflex     coronary artery by Dr. Allyson Sabal in 2008; coronary artery bypass graft     x1 off-pump, saphenous vein graft to posterior descending coronary     artery in January 2009; percutaneous coronary intervention with     stent to the posterior descending coronary artery, progressive left     main disease). 3. History of left internal carotid artery stenosis (status post     cerebrovascular accident and stents). 4. History of hypertension. 5. History of hyperlipidemia. 6. History of type 2 diabetes mellitus. 7. History of diabetic neuropathy. 8. History of peripheral vascular disease. 9. History of tobacco abuse. 10.History of narcotic abuse. 11.History of chronic mastitis.  DISCHARGE DIAGNOSES: 1. Severe right internal carotid artery stenosis. 2. History of coronary artery disease (status post percutaneous     coronary intervention with stent to the mid left circumflex     coronary artery by Dr. Allyson Sabal in 2008; coronary artery bypass graft     x1 off-pump, saphenous vein graft to posterior descending coronary     artery in January 2009; percutaneous coronary intervention with     stent to the posterior descending coronary artery, progressive left     main disease). 3. History of left internal carotid artery stenosis (status post     cerebrovascular accident and stents). 4. History of hypertension. 5. History of hyperlipidemia. 6. History of type 2 diabetes  mellitus. 7. History of diabetic neuropathy. 8. History of peripheral vascular disease. 9. History of tobacco abuse. 10.History of narcotic abuse. 11.History of chronic mastitis. 12.Acute blood loss anemia.  PROCEDURES: 1. Cardiac catheterization performed Dr. Allyson Sabal on Feb 03, 2011. 2. Cerebral angiogram performed by Dr. Allyson Sabal on Feb 03, 2011. 3. Right carotid endarterectomy with Dacron patch angioplasty by Dr.     Hart Rochester on Feb 04, 2011. 4. Redo sternotomy, coronary artery bypass graft x3 (left internal     mammary to left anterior descending, saphenous vein graft to obtuse     marginal II), saphenous vein graft to posterior descending coronary artery with     endoscopic vein harvesting from the left leg by Dr. Cornelius Moras on Feb 09, 2011. 6. Intraoperative transesophageal echocardiography, performed by Dr.     Jean Rosenthal on Feb 09, 2011.  HISTORY OF PRESENTING ILLNESS:  This is a 50 year old Caucasian female with a history of coronary artery disease (dating back to 2008), cerebrovascular disease, hypertension, hyperlipidemia, type 2 diabetes mellitus, and longstanding tobacco abuse. In 2008, she began having  symptoms of angina pectoris.  She underwent PCI and stent to the mid left circumflex coronary artery by Dr. Allyson Sabal.  She continued to have  angina and ultimately underwent an off- pump CABG x1 (SVG to posterior descending coronary artery in January 2009).  Initially, she did fairly well. In March 2010, however, she developed recurrent chest pain.  A cardiac  catheterization revealed narrowing of the proximal portion of the previous vein graft.  She then underwent a PCI and stent of the vein graft.  She also that year underwent stenting of both common iliac arteries by Dr. Allyson Sabal.  She continued to have chronic pain in both lower extremities and appears to have symptoms suggestive of claudication.  Also in October 2010, she presented with a left hemispheric stroke.  She was found to  have 80% left carotid artery stenosis.  She underwent a stent of her left carotid artery by Dr. Allyson Sabal and Dr. Myra Gianotti in November 2010.  In May 2011, she was hospitalized with another episode of prolonged right-sided weakness and dysarthria, similar to the symptoms she suffered at the time of her initial stroke. During that hospitalization, she underwent repeat cardiac catheterization because of episodes of substernal chest discomfort that appeared to be consistent with angina pectoris.  Results revealed progression of left main disease between 50% and 70% of the distal left main as well as 60% ostial stenosis of left circumflex coronary artery. There was also progression of native disease in the posterior descending coronary artery beyond the distal anastomosis of the previous vein graft.  A cerebral arteriogram was then done.  Results indicated significant in-stent restenosis of the left carotid artery.  This was treated medically.        Most recently, she was then seen in the office by Dr. Allyson Sabal secondary to complaints of nocturnal angina over the last couple of weeks.  A repeat carotid duplex scan was performed that demonstrated severe bilateral internal carotid artery stenosis with approximately 90% stenosis on the right and at least a 70-80% stenosis on the left.  She was initially brought to [name] on Feb 03, 2011, to undergo an elective cardiac catheterization with bilateral cerebral arteriogram by Dr. Allyson Sabal.  Cerebral arteriogram demonstrated high-grade right internal carotid artery stenosis and left internal carotid artery stenosis. Cardiac cath results showed further progression of the left main disease with what appeared to be a 70-80% smooth, distal tapered stenosis of the distal left main coronary artery as well as a high-grade 90% stenosis of the proximal end of the stent of left circumflex coronary artery.  Left ventricular function appeared to be preserved.  A  Cardiothoracic consultation was obtained with Dr. Cornelius Moras for the consideration of redo sternotomy for coronary artery bypass grafting surgery.  A Vascular consultation was initially obtained with Dr. Imogene Burn regarding severe symptomatic right internal carotid artery stenosis.  A long discussion was then had between Cardiothoracic and Vascular Surgery and Cardiology in regards to how to proceed with the patient's surgery as she has significant right internal carotid artery stenosis and also requires a redo sternotomy and CABG.  It was ultimately decided that the patient needed to undergo right internal carotid endarterectomy with a Dacron patch angioplasty first, and this would then be followed several days later by the redo sternotomy and CABG.  It should be noted that the patient did undergo preoperative ABIs.  She was found to have 0.93 on the right and 0.76 on the left.  Potential risks, complications, and benefits of the surgery were discussed with the patient, and she agreed to proceed with surgery.  BRIEF HOSPITAL COURSE STAY:  Regarding the patient's right internal carotid  endarterectomy, she remained surgically stable, and her neuro was grossly intact.  Following her redo sternotomy for CABG x3 on Feb 09, 2011, the patient was extubated early in the evening of surgery without difficulty.  She remained afebrile and eventually was able to be weaned off, and she was started on low-dose beta-blocker.  She was also restarted on her Plavix early in her postop course.  As previously stated, the patient has history of diabetes mellitus.  She was weaned off her insulin drip and gradually restarted on insulin, which was titrated accordingly based on her glucose.  She began to spontaneously diurese.  She was found to have acute blood loss anemia.  Her H and H went as low as 8.2 and 24.8.  She did not require postoperative transfusion.  She was felt surgically stable for transfer from the ICU to  PCTU for further convalescence on February 11, 2011.  Her pacing wires were removed on February 12, 2011.  She was placed on Lasix 40 mg p.o. 2 times daily to assist with diuresis as she was several pounds over her preoperative weight.  She then developed hypotension with an SBP into the 80s to 90s.  Her Lasix was decreased to 40 mg p.o. daily.  Her orthostatic blood pressures checked on February 13, 2011, showed lying to be 85/60, sitting 121/78, and standing 99/63.  Her Lasix was discontinued as a result of her orthostatic hypotension.  She continued to progress with cardiac rehab.  Currently on postop day #5, she is afebrile.  She is maintaining sinus rhythm.  Her SBP is 90s to 110s.  Her O2 sat is 93- 94% on room air.  On physical examination, chest clear to auscultation bilaterally.  Her sternal and lower extremity wound incision is clean, dry, and continuing to heal.  She has already been tolerating a diet and has had a bowel movement.  Her chest tube stitches will be removed prior to her discharge.  Provided the patient remains afebrile, hemodynamically stable, and pending morning round of evaluation should be surgically stable for discharge on February 15, 2011.  Latest laboratory studies showed on February 12, 2011, the BMET, potassium 4, sodium 140, BUN and creatinine 15 and 0.63 respectively.  CBC on this date, H and H 8.7 and 27.2, white count 9400, platelet count 348,000.  Last chest x-ray done on February 12, 2011, showed bibasilar atelectasis with probable small pleural effusions.  No evidence of pulmonary edema.  Stable cardiomegaly.  Hemoglobin A1c preop 11.9.  Discharge instructions include the following: Diet:  Diabetic low-sodium heart-healthy. Activity:  No driving.  No strenuous activity.  No lifting more than 10 pounds until instructed otherwise.  The patient is to continue with her breathing exercise daily.  She is to walk daily and increase her frequency and duration as she tolerates. Wound  care:  She may shower.  She is to cleanse her wounds with mild soap and water.  She is to call the office if any wound problems arise.  FOLLOWUP APPOINTMENTS: 1. The patient needs to contact her medical doctor regarding further     diabetes and management as her hemoglobin A1c is 11.9. 2. The patient needs to contact Dr. Hazle Coca office for followup in 2     weeks. 3. The patient is going to have an appointment made to see Dr. Cornelius Moras in     3 weeks and 45 minutes prior to this office appointment, a chest x-     ray will be obtained.  Our office will contact the patient with an     appointment date and time.  Discharge medications at the time of this dictation include the following: 1. Enteric-coated aspirin 81 mg p.o. daily. 2. Lopressor 25 mg one-tablet p.o. 2 times daily. 3. Nexium 40 mg p.o. daily. 4. NovoLog insulin 20-40 units subcu 3 times daily with meal based on     sliding scale. 5. Plavix 75 mg p.o. daily. 6. Crestor 5 mg p.o. at bedtime. 7. Lantus insulin glargine 20 units subcu 2 times daily, will need to     be titrated based on glucose as outpatient. 8. Oxycodone 5 mg 3 tablets p.o. q.4 h. p.r.n. pain.  Please note the patient was not discharged on an ACE inhibitor or ARB secondary to previous hypotension.     Doree Fudge, PA   ______________________________ Salvatore Decent Cornelius Moras, M.D.    DZ/MEDQ  D:  02/14/2011  T:  02/15/2011  Job:  [id number]  cc:   Jorge Ny, MD Salvatore Decent Cornelius Moras, M.D. Antoine Primas, DO  Electronically Signed by Doree Fudge PA on 02/15/2011 11:55:56 AM Electronically Signed by Tressie Stalker M.D. on 02/15/2011 03:47:46 PM

## 2011-02-21 NOTE — Telephone Encounter (Signed)
[name] in the meantime need to have a rx for her glucometer and strips sent to [facility] in [place].  The system must be the ACCucheck or [employer]IV[employer] brands in order for medicaid to cover them.  Please let her know when sent.

## 2011-02-22 ENCOUNTER — Other Ambulatory Visit: Payer: Self-pay | Admitting: Family Medicine

## 2011-02-23 NOTE — Procedures (Signed)
NAMELAJEANA, STROUGH               ACCOUNT NO.:  [id number]  MEDICAL RECORD NO.:  [id number]           PATIENT TYPE:  I  LOCATION:  2926                         FACILITY:  [facility]  PHYSICIAN:  Nanetta Batty, M.D.   DATE OF BIRTH:  [birth year]  DATE OF PROCEDURE: DATE OF DISCHARGE:                   PERIPHERAL VASCULAR INVASIVE PROCEDURE   Ms. Brazel is a 50 year old married Caucasian female mother of 2, grandmother of 1 grandchild with history of CAD and PVOD.  She has had circumflex stenting and PDA bypass grafting in the past and stented her bypass graft as well.  She has risk factors including hypertension, hyperlipidemia and insulin-dependent diabetes.  I stented her left internal carotid artery in the "choice protocol."  Procedure was complicated by recalcitrant hypertension afterwards.  I recapped it because of unstable angina documenting progression of her left main disease and she was seen by TCTS returned today for redo bypass surgery because of comorbidities.  She was seen in the office 2 days ago with complaints of crescendo angina with being awakened at night with chest pain lasting up to half hour.  I did carotid Dopplers on her in the office yesterday revealing significant progression of disease on both sides.  She was admitted today for a left cath revealing progression of the left main bifurcation disease, in-stent restenosis within the circumflex stent and a patent RCA, SVG with preserved LV function.  She presents now for cerebral angiography to define her cerebral anatomy in order to determine therapeutic strategy in the setting of left main coronary artery disease and unstable angina.  PROCEDURE DESCRIPTION:  Using a 5-French sheath and a JB-1 catheter, selective angiogram was performed of the right and left carotid arteries.  Both intra and extracranial views were obtained.  The vertebral arteries were not visualized.  ANGIOGRAPHIC RESULTS: 1. Right carotid:   There was then 80% hypodense calcified stenosis in     the distal right common extending into the right internal carotid     artery.  There was an internal carotid artery stenosis in the 50-     60% range.  The intracranial vessels will be read by neuro     interventional radiology.  1. Left carotid; the left carotid stent was patent and there was a 75-     85% stenosis at the leading edge with high velocity suggesting     physiologic significance.  IMPRESSION:  Ms. Hawbaker has had aggressive progression of her extracranial carotid disease involving her right internal carotid artery and her left distal common carotid artery at the leading edge of her stent.  Both of these will need to be revascularized I suspect percutaneously, however, in the setting of left main three-vessel disease and unstable angina this becomes problematic.  Endarterectomy also becomes an issue since the patient is on Plavix.  I will review the films with both my Vascular Surgery and  Cardiothoracic Surgery colleagues.  Sheath was removed and pressure was held in the groin to achieve hemostasis.  The patient left the lab in stable condition.  I will start heparin in 4 hours and the patient was transferred to the step-down unit.  Nanetta Batty, M.D.     [name]  D:  02/03/2011  T:  02/03/2011  Job:  [id number]  cc:   Laurell Josephs Second [facility] Antoine Primas, DO  Electronically Signed by Nanetta Batty M.D. on 02/23/2011 10:45:54 AM

## 2011-02-23 NOTE — Cardiovascular Report (Signed)
[name], GRAZIOSI NO.:  [id number]  MEDICAL RECORD NO.:  [id number]           PATIENT TYPE:  I  LOCATION:  2926                         FACILITY:  [facility]  PHYSICIAN:  Nanetta Batty, M.D.   DATE OF BIRTH:  05/17/1961  DATE OF PROCEDURE: DATE OF DISCHARGE:                           CARDIAC CATHETERIZATION   Ms. Cuthbert is a 50 year old married Caucasian female mother of 2, grandmother of 1 grandchild who I just saw in the office on May 22.  She has a history of CAD and PVOD.  She has had coronary artery stenting of her circumflex with one-vessel bypass by Dr. Cornelius Moras to a distal RCA which is nonpercutaneously revascularizable.  I stented both of her iliacs as well as her left internal carotid artery in the past.  Other problems include hypertension, hyperlipidemia, insulin-dependent diabetes.  She has had a left-sided stroke involving the right side of her body in the past.  Angiogram revealing high-grade left ICA stenosis which I stented in the "choice protocol."  Postprocedure, she had recalcitrant hypertension.  She was catheterized again on Jan 14, 2010, by myself revealing progression of disease in the left main from 40-70% over 1 year with a patent PDA vein graft, patent stent in the proximal portion of the vein graft and patent circumflex stent.  She was seen by TCTS who thought she was not a surgical candidate because of comorbidities. Recently over the last several weeks, she has had crescendo angina with chest pain that has awakened her from sleep.  She had carotid Dopplers done in our office yesterday which showed high-grade progression of her right ICA with in-stent restenosis within her left ICA stent.  She presents now for cardiac catheterization and cerebral angiography to define her anatomy.  DESCRIPTION OF PROCEDURE:  The patient was brought to the Second Floor [facility] in the postabsorptive state.  She was not premedicated.   Her right groin was prepped and shaved in usual sterile fashion.  Xylocaine 1% was used for local anesthesia.  A 5-French sheath was inserted into the right femoral artery using standard Seldinger technique.  A 5-French right-left Judkins diagnostic catheter as well as 5-French pigtail catheter were used for selective cholangiography, left ventriculography, selective vein graft angiography and subselective left internal mammary artery angiography.  Visipaque dye was used for entirety of the case.  Retrograde aorta, left ventricular, and pullback blood pressures were recorded.  HEMODYNAMIC RESULTS: 1. Aortic systolic pressure 200, diastolic pressure 111. 2. Left ventricular systolic pressure 199 and diastolic pressure 33. 3. Selective cholangiography 4. Left main; there was an 85% hypodense distal left main stenosis     just before the bifurcation. 5. LAD; there was 60% ostial/proximal LAD lesion, 40-60% mid-LAD     lesion after the first diagonal branch. 6. Left circumflex; there was 80% segmental proximal ostial/proximal     left circumflex artery stenosis.  There was 95% "in-stent     restenosis" within the proximal portion of the mid AV groove     circumflex stent.  The first OM branch was small-to-moderate size     and had  a 95% proximal stenosis. 7. Right coronary artery; this is a dominant vessel that was     subtotally occluded. 8. Vein to the PDA.  This was patent to the patent stent at the aortal     ostium and the patent anastomosis.  There was a 95% stenosis in the     PDA just beyond vein graft insertion. 9. Left ventriculography; RAO left ventriculogram was performed using     25 mL of Visipaque dye at 12 mL per second.  The overall LVEF     estimated greater than 60% without focal wall motion abnormalities. 10.Left internal mammary artery; this vessel was subselectively     visualizes and was widely patent.  It is suitable for use during     coronary artery bypass  grafting if necessary.  IMPRESSION:  Ms. Sylvia has had progression of her left main trifurcation disease with aggressive in-stent restenosis within her AV groove circumflex stent.  She is a high high-risk PCI patient especially in light of her left main disease and high-grade bilateral internal carotid artery stenosis.  She will have staged cerebral angiography after which sheath will be removed and pressure will be held in the groin to achieve hemostasis.  The patient will go to the TCU where she will be heparinized 6 hours after sheath removal.  TCTS will be notified.  She left the lab in stable condition.     Nanetta Batty, M.D.     [name]  D:  02/03/2011  T:  02/03/2011  Job:  [id number]  cc:   Second Floor [place] [facility] Antoine Primas, DO  Electronically Signed by Nanetta Batty M.D. on 02/23/2011 10:45:56 AM

## 2011-02-24 NOTE — Telephone Encounter (Signed)
Called in Accucheck device along with strips (box of 100) to check bloood sugars BID. Patient informed.

## 2011-02-24 NOTE — Telephone Encounter (Signed)
Needs to talk to nurse today.  Gwendolyn Grant is out with death in family

## 2011-02-28 NOTE — Discharge Summary (Signed)
  NAMESTERLING, UCCI               ACCOUNT NO.:  [id number]  MEDICAL RECORD NO.:  [id number]  LOCATION:  2036                         FACILITY:  [facility]  PHYSICIAN:  Salvatore Decent. Cornelius Moras, M.D. DATE OF BIRTH:  [birth year]  DATE OF ADMISSION:  02/03/2011 DATE OF DISCHARGE:  02/15/2011                              DISCHARGE SUMMARY   ADDENDUM:  This is an addendum to the patient's discharge summary dictated February 14, 2011.  On morning rounds of February 15, 2011, postop day #6, the patient is afebrile, normal sinus rhythm.  Blood pressure stable.  O2 sats greater than 90% on room air.  All incisions were clean, dry and intact and healing well.  It is felt that she is ready for discharge to home today, February 15, 2011, postop day #6.  She was seen and evaluated by Dr. Cornelius Moras.  The patient still had some lower extremity edema and felt that she would benefit from a week's worth of Lasix.  We will add a week's worth of potassium as well.  Please see dictated discharge summary for followup appointments and discharge instructions as well as discharge medications with the addition of Lasix 40 mg daily x1 week and potassium chloride 20 mEq daily x1 week.     Sol Blazing, PA   ______________________________ Salvatore Decent. Cornelius Moras, M.D.    KMD/MEDQ  D:  02/15/2011  T:  02/15/2011  Job:  [id number]  cc:   Salvatore Decent. Cornelius Moras, M.D. Nanetta Batty, M.D. Quita Skye Hart [place], M.D.  Electronically Signed by Cameron Proud PA on 02/18/2011 01:05:17 PM Electronically Signed by Tressie Stalker M.D. on 02/28/2011 07:49:27 AM

## 2011-03-02 HISTORY — PX: ENDARTERECTOMY: SHX5162

## 2011-03-04 ENCOUNTER — Other Ambulatory Visit: Payer: Self-pay | Admitting: Thoracic Surgery (Cardiothoracic Vascular Surgery)

## 2011-03-04 ENCOUNTER — Encounter: Payer: Self-pay | Admitting: Family Medicine

## 2011-03-04 ENCOUNTER — Ambulatory Visit (INDEPENDENT_AMBULATORY_CARE_PROVIDER_SITE_OTHER): Payer: Medicaid Other | Admitting: Family Medicine

## 2011-03-04 DIAGNOSIS — I10 Essential (primary) hypertension: Secondary | ICD-10-CM

## 2011-03-04 DIAGNOSIS — I2510 Atherosclerotic heart disease of native coronary artery without angina pectoris: Secondary | ICD-10-CM

## 2011-03-04 DIAGNOSIS — I6529 Occlusion and stenosis of unspecified carotid artery: Secondary | ICD-10-CM

## 2011-03-04 DIAGNOSIS — F329 Major depressive disorder, single episode, unspecified: Secondary | ICD-10-CM

## 2011-03-04 DIAGNOSIS — E1159 Type 2 diabetes mellitus with other circulatory complications: Secondary | ICD-10-CM

## 2011-03-04 NOTE — Progress Notes (Signed)
  Patient returns for routine followup status post redo coronary artery bypass grafting x3 on 02/09/2011. Prior to this the patient underwent right carotid endarterectomy on 02/05/2011 by Dr. Quita Skye. Hart Rochester. Postoperatively the patient has done well. Since hospital discharge she has continued to recover uneventfully. She has been seen in followup by Dr. Allyson Sabal. She returns to the office for routine followup today.  Patient reports soreness in the anterior chest appropriate for her recent surgery. This has gradually improved, but she continues to require narcotic pain relievers. She denies any shortness of breath either with activity or at rest. She has not had any chest pain similar to her previous symptoms of angina. Her activity level is good. She is sleeping fairly well at night. Her appetite is good. She denies fevers chills or cough. She reports that her blood sugars have been under good control. She is not smoking.  On physical exam the patient looks good. All of her surgical incisions are healing nicely. Breath sounds are clear to auscultation and symmetrical bilaterally. Cardiovascular exam is notable for regular rate and rhythm. Her extremities are warm and well-perfused. There is no lower extremity edema.  Chest x-ray performed today is reviewed. This demonstrates clear lung fields bilaterally with no significant pleural effusions. All of the sternal wires appear intact.  In the future the patient will return to see [place] as needed. She has been reminded refrain from any heavy lifting or strenuous use of her arms or shoulders or at least another 2-3 months. Once she gets to the point where she no longer requires oral narcotic pain relievers during the daytime she may resume driving an automobile. However, she should not be driving as long as she continues to require oral narcotic pain relievers. All of her questions been addressed.

## 2011-03-05 ENCOUNTER — Encounter: Payer: Self-pay | Admitting: Family Medicine

## 2011-03-05 NOTE — Assessment & Plan Note (Signed)
Continue the effexor seems to be doing very very well.

## 2011-03-05 NOTE — Assessment & Plan Note (Signed)
At goal, continue current regimen  WOuld consider adding lisinopril if pt can tolerate it.

## 2011-03-05 NOTE — Assessment & Plan Note (Signed)
Pt is to have a left side endarterectomy next month as well, will see when that is scheduled with Dr. Cornelius Moras, found to have >80% stenosis no bruit heard on physical exam.

## 2011-03-05 NOTE — Assessment & Plan Note (Signed)
Pt appears to be doing much better would like a little tighter control but much better then it had been will get an A1c in the next 2 months and see how we are doing.  Hoe motivation on pt part continues.

## 2011-03-05 NOTE — Progress Notes (Signed)
  Subjective:    Patient ID: [name], female    DOB: November 20, 1960, 50 y.o.   MRN: [id number]  HPI    Pt was recently admitted by Dr. Allyson Sabal and Dr. Cornelius Moras for a repeat three vessel CABG and a right carotid Endarterectomy.   Pt was having some night time chest pain and imaging showed that pt had significant stenosis of the carotid.  Pt was admitted for about a week and now is doing cardiac rehab, pt does have an appointment with Dr Allyson Sabal at the end of today and will be seeing Dr. Cornelius Moras next week.  Pt though is feeling much better and even though pt missed her daughter's graduation she is feeling better about her future. Pt states the incision is minorly sore especially near the neck but   1. Hypertension Blood pressure at home: has not checked but did have a lot of her medications changed.  See medication reconciliation.  Blood pressure today: 106/73 No side effects from medications ROS: Denies headache visual changes, vomiting, chest pain or abdominal pain or shortness of breath.  2. Diabetes:  High at home: 250 Low at home: 100 Doing the lantus at 40 units BID ROS: denies fever, chills, dizziness, loss of conscieness, numbness or tingling in extremities or chest pain. Pt had started taking her insulin again and itdid seem to help.  Does feel funny though when less than 120.   3 Pt is feeling much better about the future has a good outlook and enjoying the effexor denies  Suicidal and Homicidal ideation     Review of Systems Denies fever, chills, nausea vomiting abdominal pain, dysuria, chest pain, shortness of breath dyspnea on exertion or numbness in extremities  Past medical history, social, surgical and family history all reviewed.      Objective:   Physical Exam  Generally: much better color than usual scar on her right side of neck extending to chest wall, well healing.   CV RRR no murmur appreciated this time. Pul: CTAB Abd:  BS +,  Afebrile NT, ND Ext:  no edema at ankles.         Assessment & Plan:

## 2011-03-05 NOTE — Assessment & Plan Note (Signed)
Repeat CABG three vessel this year 2012.  Doing very well.  Pt though under the understanding of not taking her statin or her lisinopril per her cardiologist told her though this would be beneficial sue to her DM and he hx of CVA.  Pt will discuss it with Dr. Allyson Sabal given note to give to Dr. Allyson Sabal to ask why this medicine would be held. Would start them at least at low dose. Will await Dr. Allyson Sabal answer.

## 2011-03-07 ENCOUNTER — Ambulatory Visit
Admission: RE | Admit: 2011-03-07 | Discharge: 2011-03-07 | Disposition: A | Discharge status: Home / Self Care | Payer: Medicaid Other | Source: Ambulatory Visit | Attending: Thoracic Surgery (Cardiothoracic Vascular Surgery) | Admitting: Thoracic Surgery (Cardiothoracic Vascular Surgery)

## 2011-03-07 ENCOUNTER — Ambulatory Visit (INDEPENDENT_AMBULATORY_CARE_PROVIDER_SITE_OTHER): Payer: Self-pay | Admitting: Thoracic Surgery (Cardiothoracic Vascular Surgery)

## 2011-03-07 ENCOUNTER — Encounter: Payer: Self-pay | Admitting: Thoracic Surgery (Cardiothoracic Vascular Surgery)

## 2011-03-07 DIAGNOSIS — I2510 Atherosclerotic heart disease of native coronary artery without angina pectoris: Secondary | ICD-10-CM

## 2011-03-07 DIAGNOSIS — Z09 Encounter for follow-up examination after completed treatment for conditions other than malignant neoplasm: Secondary | ICD-10-CM

## 2011-03-07 NOTE — Patient Instructions (Signed)
Refrain from any heavy lifting or strenuous use of her arms her shoulders for at least 2-3 months. Otherwise increase physical activity as tolerated. Strongly encourage enrolling in the cardiac rehabilitation program. Avoid tobacco use forever. Continue to closely monitor blood glucose control.

## 2011-03-09 ENCOUNTER — Encounter: Payer: Medicaid Other | Attending: Family Medicine

## 2011-04-04 ENCOUNTER — Telehealth: Payer: Self-pay

## 2011-04-04 NOTE — Telephone Encounter (Signed)
RX for Hydrocodone 7.5/500mg  1-2 tabs po every 6 hrs with food prn for pain. Per Dr. Cornelius Moras. Called to [employer]

## 2011-04-09 ENCOUNTER — Other Ambulatory Visit: Payer: Self-pay | Admitting: Family Medicine

## 2011-04-10 NOTE — Telephone Encounter (Signed)
Refill request

## 2011-05-05 ENCOUNTER — Encounter: Payer: Self-pay | Admitting: Family Medicine

## 2011-05-05 ENCOUNTER — Ambulatory Visit (INDEPENDENT_AMBULATORY_CARE_PROVIDER_SITE_OTHER): Payer: Medicaid Other | Admitting: Family Medicine

## 2011-05-05 VITALS — BP 130/88 | HR 93 | Temp 98.2°F | Wt 159.0 lb

## 2011-05-05 DIAGNOSIS — E1159 Type 2 diabetes mellitus with other circulatory complications: Secondary | ICD-10-CM

## 2011-05-05 DIAGNOSIS — I10 Essential (primary) hypertension: Secondary | ICD-10-CM

## 2011-05-05 DIAGNOSIS — F329 Major depressive disorder, single episode, unspecified: Secondary | ICD-10-CM

## 2011-05-05 DIAGNOSIS — I2510 Atherosclerotic heart disease of native coronary artery without angina pectoris: Secondary | ICD-10-CM

## 2011-05-05 MED ORDER — LISINOPRIL 10 MG PO TABS: 10.0000 mg | ORAL_TABLET | Freq: Every day | ORAL | Status: DC

## 2011-05-05 MED ORDER — ESTROGENS, CONJUGATED 0.625 MG/GM VA CREA: TOPICAL_CREAM | Freq: Every day | VAGINAL | Status: AC

## 2011-05-05 MED ORDER — OXYCODONE-ACETAMINOPHEN 5-325 MG PO TABS: 2.0000 | ORAL_TABLET | Freq: Three times a day (TID) | ORAL | Status: DC | PRN

## 2011-05-05 NOTE — Assessment & Plan Note (Signed)
Pt doing very well will continue Effexor, and will keep same dose due to pt not wanting to increase.

## 2011-05-05 NOTE — Progress Notes (Signed)
  Subjective:    Patient ID: [name], female    DOB: May 26, 1961, 50 y.o.   MRN: [id number]  HPI    1. CAD and PVD- Seen by Dr. Allyson Sabal and Dr. Cornelius Moras.  Within last 3 months pt has had a repeat three vessel CABG, a right carotid Endarterectomy and will be having left carotid enterectomy in near future.  Pt last carotid doppler showed 75% blockage but last stress test was negative.   Pt denies chest pain, shortness of breath dyspnea on exertion, dizziness.  Pt has been having still pain from the incision and the breaking of the ribs, pt has been taking her oxycodone a little more liberally and is out, sees the pain clinic in 2 weeks.    2. Hypertension Blood pressure at home: has not checked but did have a lot of her medications changed.  See medication reconciliation.  Blood pressure today: 130/88 No side effects from medications ROS: Denies headache visual changes, vomiting, chest pain or abdominal pain or shortness of breath.  3. Diabetes:  High at home: 392 Low at home: 100 Doing the lantus at 65 units BID and 35 units of the novolog once daily.  ROS: denies fever, chills, dizziness, loss of conscieness, numbness or tingling in extremities or chest pain. Pt had started taking her insulin again and itdid seem to help.  Does feel funny though when less than 120. Pt though states it has improved overall.   4 Pt is feeling much better about the future has a good outlook and enjoying the effexor denies  Suicidal and Homicidal ideation.  Pt is happy with the dose, pt also states she may start to look for a part time job here soon.     5. Preventative care-  Pt is due for colonoscopy and her last mammogram was  2010.    Review of Systems as stated in the HPI  Past medical history, social, surgical and family history all reviewed.      Objective:   Physical Exam Generally: much better color than usual scar on her right side of neck extending to chest wall, well healed.   CV RRR no  murmur appreciated this time. Pul: CTAB Abd:  BS +,  Afebrile NT, ND Ext:  no edema at ankles.  Psych-  Pt is doing well, good affect good outlook of future.     Assessment & Plan:

## 2011-05-05 NOTE — Assessment & Plan Note (Addendum)
Pt on ASA, plavix, s/p CABG x2 3 vessel, on statin, will add lisinopril as well. Follwed By Dr. Allyson Sabal and Dr. Cornelius Moras, seeing him on the 12th.   Will need cholesterol checked in 3 months and will likely need to increase Statin.

## 2011-05-05 NOTE — Patient Instructions (Signed)
Good to see you  I am giving you the numbers for colonoscopy and mammogram.  I want you start taking lisinopril. One pill daily   Lab Results  Component Value Date   HGBA1C 9.6 05/05/2011   Much better but do increase your AM lantus to 70 units and still 65 at night.

## 2011-05-05 NOTE — Assessment & Plan Note (Signed)
Near goal will add lisinopril due to pt having DM and stroke ppx will be beneficial.

## 2011-05-12 ENCOUNTER — Ambulatory Visit: Payer: Medicaid Other | Admitting: Family Medicine

## 2011-05-18 ENCOUNTER — Ambulatory Visit: Payer: Medicaid Other | Admitting: Family Medicine

## 2011-06-03 LAB — POCT I-STAT 3, ART BLOOD GAS (G3+)
Acid-base deficit: 5 — ABNORMAL HIGH
Bicarbonate: 22.5
Operator id: 199821
Operator id: 274841
Patient temperature: 35
Patient temperature: 36.9
TCO2: 24
pH, Arterial: 7.4

## 2011-06-03 LAB — TYPE AND SCREEN: ABO/RH(D): A NEG

## 2011-06-03 LAB — I-STAT EC8
Acid-base deficit: 2
Chloride: 107
Glucose, Bld: 117 — ABNORMAL HIGH
TCO2: 22
pCO2 arterial: 32.3 — ABNORMAL LOW
pH, Arterial: 7.428 — ABNORMAL HIGH

## 2011-06-03 LAB — DIFFERENTIAL
Basophils Absolute: 0
Basophils Relative: 0
Eosinophils Absolute: 0.3
Monocytes Absolute: 0.4
Monocytes Relative: 4
Neutro Abs: 5.6
Neutrophils Relative %: 57

## 2011-06-03 LAB — BLOOD GAS, ARTERIAL
Bicarbonate: 23.9
FIO2: 0.21
O2 Saturation: 95.7
pCO2 arterial: 41.4
pH, Arterial: 7.379
pO2, Arterial: 78.8 — ABNORMAL LOW

## 2011-06-03 LAB — POCT I-STAT 4, (NA,K, GLUC, HGB,HCT)
Glucose, Bld: 118 — ABNORMAL HIGH
Glucose, Bld: 150 — ABNORMAL HIGH
Glucose, Bld: 151 — ABNORMAL HIGH
Glucose, Bld: 177 — ABNORMAL HIGH
HCT: 28 — ABNORMAL LOW
HCT: 29 — ABNORMAL LOW
Hemoglobin: 10.2 — ABNORMAL LOW
Hemoglobin: 9.5 — ABNORMAL LOW
Hemoglobin: 9.9 — ABNORMAL LOW
Operator id: 3291
Operator id: 3291
Operator id: 3291
Operator id: 3291
Potassium: 3.8
Potassium: 4
Potassium: 4
Sodium: 138
Sodium: 138
Sodium: 138

## 2011-06-03 LAB — BASIC METABOLIC PANEL
BUN: 13
BUN: 19
CO2: 24
CO2: 26
CO2: 27
Chloride: 103
Chloride: 105
Chloride: 106
Chloride: 107
Chloride: 107
Creatinine, Ser: 0.78
Creatinine, Ser: 0.87
Creatinine, Ser: 0.93
Creatinine, Ser: 1
GFR calc Af Amer: >60
GFR calc Af Amer: >60
GFR calc Af Amer: >60
GFR calc Af Amer: >60
GFR calc non Af Amer: 60 — ABNORMAL LOW
GFR calc non Af Amer: >60
Glucose, Bld: 151 — ABNORMAL HIGH
Glucose, Bld: 219 — ABNORMAL HIGH
Potassium: 3.7
Potassium: 4
Potassium: 4.5
Sodium: 138
Sodium: 141

## 2011-06-03 LAB — I-STAT 8, (EC8 V) (CONVERTED LAB)
Acid-base deficit: 1
BUN: 11
Bicarbonate: 24.2 — ABNORMAL HIGH
Chloride: 91 — ABNORMAL LOW
Glucose, Bld: 134 — ABNORMAL HIGH
Hemoglobin: 12.6
Hemoglobin: 7.8 — CL
Potassium: 2.7 — CL
Potassium: 4
Sodium: 122 — ABNORMAL LOW
Sodium: 138
TCO2: 26
pH, Ven: 7.325 — ABNORMAL HIGH

## 2011-06-03 LAB — CBC
HCT: 23.5 — ABNORMAL LOW
HCT: 26.6 — ABNORMAL LOW
HCT: 27.4 — ABNORMAL LOW
HCT: 28 — ABNORMAL LOW
HCT: 34 — ABNORMAL LOW
Hemoglobin: 10.9 — ABNORMAL LOW
Hemoglobin: 11 — ABNORMAL LOW
Hemoglobin: 11.3 — ABNORMAL LOW
Hemoglobin: 8 — ABNORMAL LOW
MCHC: 33.4
MCHC: 33.6
MCHC: 33.6
MCHC: 33.7
MCHC: 33.8
MCHC: 34
MCHC: 34
MCV: 83.1
MCV: 83.1
MCV: 83.4
MCV: 83.5
MCV: 83.8
MCV: 84
MCV: 84.1
MCV: 84.2
MCV: 84.3
MCV: 84.3
Platelets: 223
Platelets: 224
Platelets: 226
Platelets: 233
Platelets: 268
Platelets: 292
Platelets: 292
Platelets: 344
RBC: 2.79 — ABNORMAL LOW
RBC: 3.18 — ABNORMAL LOW
RBC: 3.35 — ABNORMAL LOW
RBC: 3.86 — ABNORMAL LOW
RBC: 3.89
RBC: 3.9
RBC: 4.01
RDW: 13.9
RDW: 14.1
RDW: 14.2
RDW: 14.3
RDW: 14.5
RDW: 14.6
WBC: 10.6 — ABNORMAL HIGH
WBC: 11.7 — ABNORMAL HIGH
WBC: 11.7 — ABNORMAL HIGH
WBC: 6.6
WBC: 9.5
WBC: 9.9

## 2011-06-03 LAB — CREATININE, SERUM
GFR calc Af Amer: >60
GFR calc non Af Amer: >60

## 2011-06-03 LAB — COMPREHENSIVE METABOLIC PANEL
Albumin: 3.7
Alkaline Phosphatase: 97
BUN: 27 — ABNORMAL HIGH
Chloride: 102
Creatinine, Ser: 1.09
Glucose, Bld: 215 — ABNORMAL HIGH
Potassium: 4.2
Total Bilirubin: 0.3
Total Protein: 6.7

## 2011-06-03 LAB — URINALYSIS, ROUTINE W REFLEX MICROSCOPIC
Glucose, UA: NEGATIVE
Hgb urine dipstick: NEGATIVE
Ketones, ur: NEGATIVE
Protein, ur: NEGATIVE
Urobilinogen, UA: 0.2

## 2011-06-03 LAB — URINE CULTURE

## 2011-06-03 LAB — PROTIME-INR
INR: 0.9
Prothrombin Time: 12.5
Prothrombin Time: 13.6

## 2011-06-03 LAB — URINE MICROSCOPIC-ADD ON

## 2011-06-03 LAB — MAGNESIUM
Magnesium: 1.9
Magnesium: 2

## 2011-06-03 LAB — APTT: aPTT: 29

## 2011-06-03 LAB — B-NATRIURETIC PEPTIDE (CONVERTED LAB): Pro B Natriuretic peptide (BNP): 262 — ABNORMAL HIGH

## 2011-06-06 LAB — CBC
HCT: 34.7 — ABNORMAL LOW
HCT: 35 — ABNORMAL LOW
Hemoglobin: 11.7 — ABNORMAL LOW
Hemoglobin: 12.4
MCHC: 33.4
MCHC: 33.7
MCV: 79.5
MCV: 80.3
MCV: 80.9
Platelets: 363
RBC: 4.69
RDW: 14.9
RDW: 15.1
WBC: 8.9

## 2011-06-06 LAB — URINALYSIS, ROUTINE W REFLEX MICROSCOPIC
Bilirubin Urine: NEGATIVE
Glucose, UA: NEGATIVE
Hgb urine dipstick: NEGATIVE
Ketones, ur: NEGATIVE
Protein, ur: NEGATIVE

## 2011-06-06 LAB — COMPREHENSIVE METABOLIC PANEL
ALT: 13
AST: 14
Albumin: 3.5
CO2: 21
Chloride: 110
Creatinine, Ser: 0.92
GFR calc Af Amer: >60
GFR calc non Af Amer: >60
Potassium: 3.7
Sodium: 140
Total Bilirubin: 0.2 — ABNORMAL LOW

## 2011-06-06 LAB — POCT CARDIAC MARKERS
CKMB, poc: <1 — ABNORMAL LOW
Myoglobin, poc: 35
Myoglobin, poc: 54.2
Troponin i, poc: <0.05

## 2011-06-06 LAB — URINE MICROSCOPIC-ADD ON

## 2011-06-06 LAB — TSH: TSH: 2.14

## 2011-06-06 LAB — BASIC METABOLIC PANEL
BUN: 21
CO2: 20
Chloride: 110
Glucose, Bld: 247 — ABNORMAL HIGH
Potassium: 3.7

## 2011-06-06 LAB — POCT I-STAT, CHEM 8
Chloride: 115 — ABNORMAL HIGH
Creatinine, Ser: 1
Hemoglobin: 14.3
Potassium: 3.9
Sodium: 142

## 2011-06-06 LAB — DIFFERENTIAL
Basophils Absolute: 0.1
Eosinophils Absolute: 0.3
Eosinophils Relative: 3
Lymphocytes Relative: 38
Lymphs Abs: 3.9
Monocytes Absolute: 0.3

## 2011-06-06 LAB — CARDIAC PANEL(CRET KIN+CKTOT+MB+TROPI)
Relative Index: INVALID
Total CK: 31
Troponin I: 0.01
Troponin I: <0.01

## 2011-06-06 LAB — CK TOTAL AND CKMB (NOT AT ARMC): CK, MB: 0.7

## 2011-06-06 LAB — LIPID PANEL
LDL Cholesterol: UNDETERMINED
Total CHOL/HDL Ratio: 9
Triglycerides: 832 — ABNORMAL HIGH
VLDL: UNDETERMINED

## 2011-06-09 LAB — POCT I-STAT, CHEM 8
Creatinine, Ser: 1
Glucose, Bld: 211 — ABNORMAL HIGH
Hemoglobin: 14.3
Sodium: 139
TCO2: 20

## 2011-06-10 LAB — COMPREHENSIVE METABOLIC PANEL
ALT: 12
ALT: 9
AST: 13
AST: 9
Albumin: 2.7 — ABNORMAL LOW
Alkaline Phosphatase: 128 — ABNORMAL HIGH
Calcium: 9.3
Chloride: 108
GFR calc Af Amer: >60
Potassium: 4
Sodium: 131 — ABNORMAL LOW
Sodium: 138
Total Protein: 6.3
Total Protein: 7.4

## 2011-06-10 LAB — DIFFERENTIAL
Basophils Absolute: 0
Basophils Relative: 1
Eosinophils Absolute: 0.1
Eosinophils Relative: 1
Lymphocytes Relative: 19
Lymphs Abs: 1.6
Monocytes Absolute: 0.4
Monocytes Relative: 5
Neutro Abs: 6.3
Neutrophils Relative %: 75

## 2011-06-10 LAB — POCT I-STAT, CHEM 8
HCT: 42
Hemoglobin: 14.3
Potassium: 4.2
Sodium: 133 — ABNORMAL LOW

## 2011-06-10 LAB — CARDIAC PANEL(CRET KIN+CKTOT+MB+TROPI)
CK, MB: 0.9
Troponin I: 0.02

## 2011-06-10 LAB — LIPID PANEL
Cholesterol: 223 — ABNORMAL HIGH
LDL Cholesterol: UNDETERMINED
Total CHOL/HDL Ratio: 8.3

## 2011-06-10 LAB — URINALYSIS, ROUTINE W REFLEX MICROSCOPIC
Bilirubin Urine: NEGATIVE
Specific Gravity, Urine: 1.042 — ABNORMAL HIGH
pH: 5

## 2011-06-10 LAB — CBC
HCT: 35.8 — ABNORMAL LOW
HCT: 39.6
Hemoglobin: 13.3
MCHC: 33.6
MCHC: 33.6
MCV: 83.6
MCV: 84.6
Platelets: 366
Platelets: 388
RBC: 4.23
RBC: 4.74
RDW: 13.5
RDW: 13.7
WBC: 8.4

## 2011-06-10 LAB — POCT CARDIAC MARKERS: Myoglobin, poc: 73.5

## 2011-06-10 LAB — BASIC METABOLIC PANEL
BUN: 18
Chloride: 105
Glucose, Bld: 77
Potassium: 3.8

## 2011-06-10 LAB — HEMOGLOBIN A1C: Hgb A1c MFr Bld: 11.9 — ABNORMAL HIGH

## 2011-06-10 LAB — TSH: TSH: 1.893

## 2011-06-10 LAB — GLUCOSE, RANDOM: Glucose, Bld: 419 — ABNORMAL HIGH

## 2011-06-10 LAB — URINE MICROSCOPIC-ADD ON

## 2011-06-10 LAB — H1N1 SCREEN (PCR)

## 2011-06-13 ENCOUNTER — Ambulatory Visit: Payer: Medicaid Other | Admitting: Family Medicine

## 2011-06-14 LAB — GLUCOSE, CAPILLARY
Glucose-Capillary: 129 — ABNORMAL HIGH
Glucose-Capillary: 178 — ABNORMAL HIGH
Glucose-Capillary: 245 — ABNORMAL HIGH
Glucose-Capillary: 442 — ABNORMAL HIGH
Glucose-Capillary: 76

## 2011-06-14 LAB — CBC
MCV: 85.2
RBC: 4.25
WBC: 9.1

## 2011-06-14 LAB — BASIC METABOLIC PANEL
Chloride: 110
Creatinine, Ser: 0.85
GFR calc Af Amer: >60
Potassium: 3.5
Sodium: 140

## 2011-06-14 LAB — HEMOGLOBIN A1C: Hgb A1c MFr Bld: 9 — ABNORMAL HIGH

## 2011-06-16 ENCOUNTER — Ambulatory Visit: Payer: Medicaid Other | Admitting: Family Medicine

## 2011-06-21 LAB — DIFFERENTIAL
Basophils Absolute: 0
Basophils Relative: 1
Eosinophils Absolute: 0.2
Eosinophils Relative: 1
Lymphocytes Relative: 19
Lymphs Abs: 1.7
Monocytes Relative: 6
Neutro Abs: 6.4
Neutrophils Relative %: 54
Neutrophils Relative %: 74

## 2011-06-21 LAB — CBC
HCT: 29.8 — ABNORMAL LOW
HCT: 37.7
MCHC: 33.5
MCHC: 33.7
MCHC: 34
MCV: 83.1
MCV: 84.1
MCV: 84.6
Platelets: 291
Platelets: 320
Platelets: 321
Platelets: 326
RBC: 3.75 — ABNORMAL LOW
RBC: 4.07
RBC: 4.46
RDW: 13.7
RDW: 13.7
WBC: 7.1
WBC: 7.2
WBC: 7.5
WBC: 8.6

## 2011-06-21 LAB — COMPREHENSIVE METABOLIC PANEL
AST: 16
BUN: 14
CO2: 23
Calcium: 9.8
Chloride: 109
Creatinine, Ser: 0.9
GFR calc Af Amer: >60
GFR calc non Af Amer: >60
Glucose, Bld: 288 — ABNORMAL HIGH
Total Bilirubin: 0.3

## 2011-06-21 LAB — POCT CARDIAC MARKERS
CKMB, poc: <1 — ABNORMAL LOW
Operator id: 272551
Troponin i, poc: <0.05

## 2011-06-21 LAB — URINALYSIS, ROUTINE W REFLEX MICROSCOPIC
Bilirubin Urine: NEGATIVE
Nitrite: NEGATIVE
Protein, ur: 30 — AB
Urobilinogen, UA: 0.2

## 2011-06-21 LAB — URINALYSIS, MICROSCOPIC ONLY
Bilirubin Urine: NEGATIVE
Glucose, UA: NEGATIVE
Hgb urine dipstick: NEGATIVE
Ketones, ur: NEGATIVE
pH: 5.5

## 2011-06-21 LAB — TSH: TSH: 0.878

## 2011-06-21 LAB — APTT
aPTT: 32
aPTT: 37

## 2011-06-21 LAB — CK TOTAL AND CKMB (NOT AT ARMC)
CK, MB: 0.8
CK, MB: 2
Relative Index: INVALID
Total CK: 20

## 2011-06-21 LAB — CARDIAC PANEL(CRET KIN+CKTOT+MB+TROPI)
CK, MB: 0.5
CK, MB: 1.2
Relative Index: INVALID
Total CK: 25
Troponin I: 0.01
Troponin I: 0.02

## 2011-06-21 LAB — BASIC METABOLIC PANEL
BUN: 13
BUN: 15
BUN: 17
CO2: 23
CO2: 23
Calcium: 8.7
Calcium: 9.1
Chloride: 106
Chloride: 106
Creatinine, Ser: 0.85
Creatinine, Ser: 0.93
Creatinine, Ser: 0.97
Creatinine, Ser: 1.09
GFR calc Af Amer: >60
GFR calc non Af Amer: 54 — ABNORMAL LOW
Glucose, Bld: 247 — ABNORMAL HIGH

## 2011-06-21 LAB — HEMOGLOBIN A1C
Hgb A1c MFr Bld: 9.8 — ABNORMAL HIGH
Mean Plasma Glucose: 272

## 2011-06-21 LAB — URINE MICROSCOPIC-ADD ON

## 2011-06-21 LAB — URINE CULTURE
Colony Count: >=100000
Special Requests: NEGATIVE

## 2011-06-21 LAB — LIPASE, BLOOD: Lipase: 28

## 2011-06-21 LAB — B-NATRIURETIC PEPTIDE (CONVERTED LAB): Pro B Natriuretic peptide (BNP): 68

## 2011-06-21 LAB — TROPONIN I: Troponin I: 0.02

## 2011-06-22 LAB — BASIC METABOLIC PANEL
BUN: 29 — ABNORMAL HIGH
CO2: 19
Chloride: 107
GFR calc non Af Amer: 52 — ABNORMAL LOW
Glucose, Bld: 208 — ABNORMAL HIGH
Potassium: 4.1

## 2011-06-22 LAB — CARDIAC PANEL(CRET KIN+CKTOT+MB+TROPI)
CK, MB: 0.9
Troponin I: 0.03

## 2011-06-22 LAB — CBC
HCT: 37.2
Hemoglobin: 12.7
MCHC: 34.2
MCV: 83.1
Platelets: 358
RDW: 13.7

## 2011-07-06 ENCOUNTER — Encounter: Payer: Self-pay | Admitting: Family Medicine

## 2011-07-07 ENCOUNTER — Ambulatory Visit (INDEPENDENT_AMBULATORY_CARE_PROVIDER_SITE_OTHER): Payer: Medicaid Other | Admitting: Family Medicine

## 2011-07-07 ENCOUNTER — Encounter: Payer: Self-pay | Admitting: Family Medicine

## 2011-07-07 DIAGNOSIS — F329 Major depressive disorder, single episode, unspecified: Secondary | ICD-10-CM

## 2011-07-07 DIAGNOSIS — I10 Essential (primary) hypertension: Secondary | ICD-10-CM

## 2011-07-07 DIAGNOSIS — E1159 Type 2 diabetes mellitus with other circulatory complications: Secondary | ICD-10-CM

## 2011-07-07 DIAGNOSIS — G8929 Other chronic pain: Secondary | ICD-10-CM | POA: Insufficient documentation

## 2011-07-07 NOTE — Assessment & Plan Note (Signed)
Patient is at goal at this time. We'll make changes in her medicine. Patient has lost some weight recently swelling may be able to start decreasing some of her medicine. Patient though does get is somewhat history of palpitations. Patient though is following up with her cardiologist next month and we'll address it with him at that time.

## 2011-07-07 NOTE — Patient Instructions (Signed)
You are doing so great  I want you to come back in 1 month for your labs.  Try to get an AM appointment and come in fasting.  See you then.

## 2011-07-07 NOTE — Assessment & Plan Note (Signed)
Patient has been followed by the pain clinic for some time patient though does have a history of narcotic abuse and do not feel comfortable giving her narcotics. Patient would like referral to a new pain clinic and would prefer preferred [facility]. We will send in referral per patient's request

## 2011-07-07 NOTE — Assessment & Plan Note (Signed)
Patient is doing very well still for an A1c next month visual come at that time.

## 2011-07-07 NOTE — Progress Notes (Signed)
  Subjective:    Patient ID: [name], female    DOB: Jun 03, 1961, 50 y.o.   MRN: [id number]  HPI 1. Hypertension Blood pressure at home: Not checking Blood pressure today: 110/80 Taking Meds: Yes Side effects: No ROS: Denies headache visual changes nausea, vomiting, chest pain or abdominal pain or shortness of breath.   Diabetes:  High at home:225 Low at home:100 Taking medications:yes Side effects:no has lost 6 pounds since last to missing her period she's also lost 20 pounds in the last calendar year. ROS: denies fever, chills, dizziness, loss of conscieness, polyuria poly dipsia numbness or tingling in extremities or chest pain. Lab Results  Component Value Date   HGBA1C 9.6 05/05/2011   will be due next month pregnancy  Cholesterol  Lab Results  Component Value Date   CHOL 284* 02/04/2011   HDL 37* 02/04/2011   LDLCALC  Value: UNABLE TO CALCULATE IF TRIGLYCERIDE OVER 400 mg/dL        Total Cholesterol/HDL:CHD Risk Coronary Heart Disease Risk Table                     Men   Women  1/2 Average Risk   3.4   3.3  Average Risk       5.0   4.4  2 X Average Risk   9.6   7.1  3 X Average Risk  23.4   11.0        Use the calculated Patient Ratio above and the CHD Risk Table to determine the patient's CHD Risk.        ATP III CLASSIFICATION (LDL):  <100     mg/dL   Optimal  409-811  mg/dL   Near or Above                    Optimal  130-159  mg/dL   Borderline  914-782  mg/dL   High  >956     mg/dL   Very High 10/26/863   LDLDIRECT 200* 07/08/2010   TRIG 414* 02/04/2011   CHOLHDL 7.7 02/04/2011    Depression: Patient is doing very well the Effexor at this time. Patient denies any suicidal or homicidal ideation. Patient actually has started working again which is the first time in approximately 6 years and is very excited. Patient is cooking and enjoying it.  Chronic pain and chronic headaches. Patient's has been seen in the pain clinic for some time she is out of line with her doctor  there stating that he doesn't come in the room anymore for her to be seen. Patient would like referral to a new pain clinic as possible.  Review of Systems As stated in the history of present illness Past medical surgical history reviewed and no changes noted    Objective:   Physical Exam Generally: much better color than usual scar on her right side of neck extending to chest wall, well healed.   CV RRR no murmur appreciated this time. Pul: CTAB Abd:  BS +,  Afebrile NT, ND Ext:  no edema at ankles.     Assessment & Plan:

## 2011-07-07 NOTE — Assessment & Plan Note (Signed)
Patient is doing much better and even started working patient has lost weight and is in much better mood. Would consider to continue antidepressant treatment indefinitely in this patient

## 2011-07-18 ENCOUNTER — Encounter: Payer: Self-pay | Admitting: Family Medicine

## 2011-08-08 ENCOUNTER — Ambulatory Visit: Payer: Medicaid Other | Admitting: Family Medicine

## 2011-08-08 ENCOUNTER — Other Ambulatory Visit: Payer: Medicaid Other

## 2011-08-16 ENCOUNTER — Other Ambulatory Visit: Payer: Medicaid Other

## 2011-08-16 ENCOUNTER — Ambulatory Visit: Payer: Medicaid Other | Admitting: Family Medicine

## 2011-09-08 ENCOUNTER — Ambulatory Visit: Payer: Medicaid Other | Admitting: Family Medicine

## 2011-09-08 ENCOUNTER — Other Ambulatory Visit: Payer: Medicaid Other

## 2011-09-24 IMAGING — CR DG CHEST 2V
2 series · 2 of 2 positions shown · non-contrast
Comparison: 02/12/2011

CLINICAL DATA: Coronary atherosclerosis.  Status post CABG.

CHEST - 2 VIEW

[w chest pa]
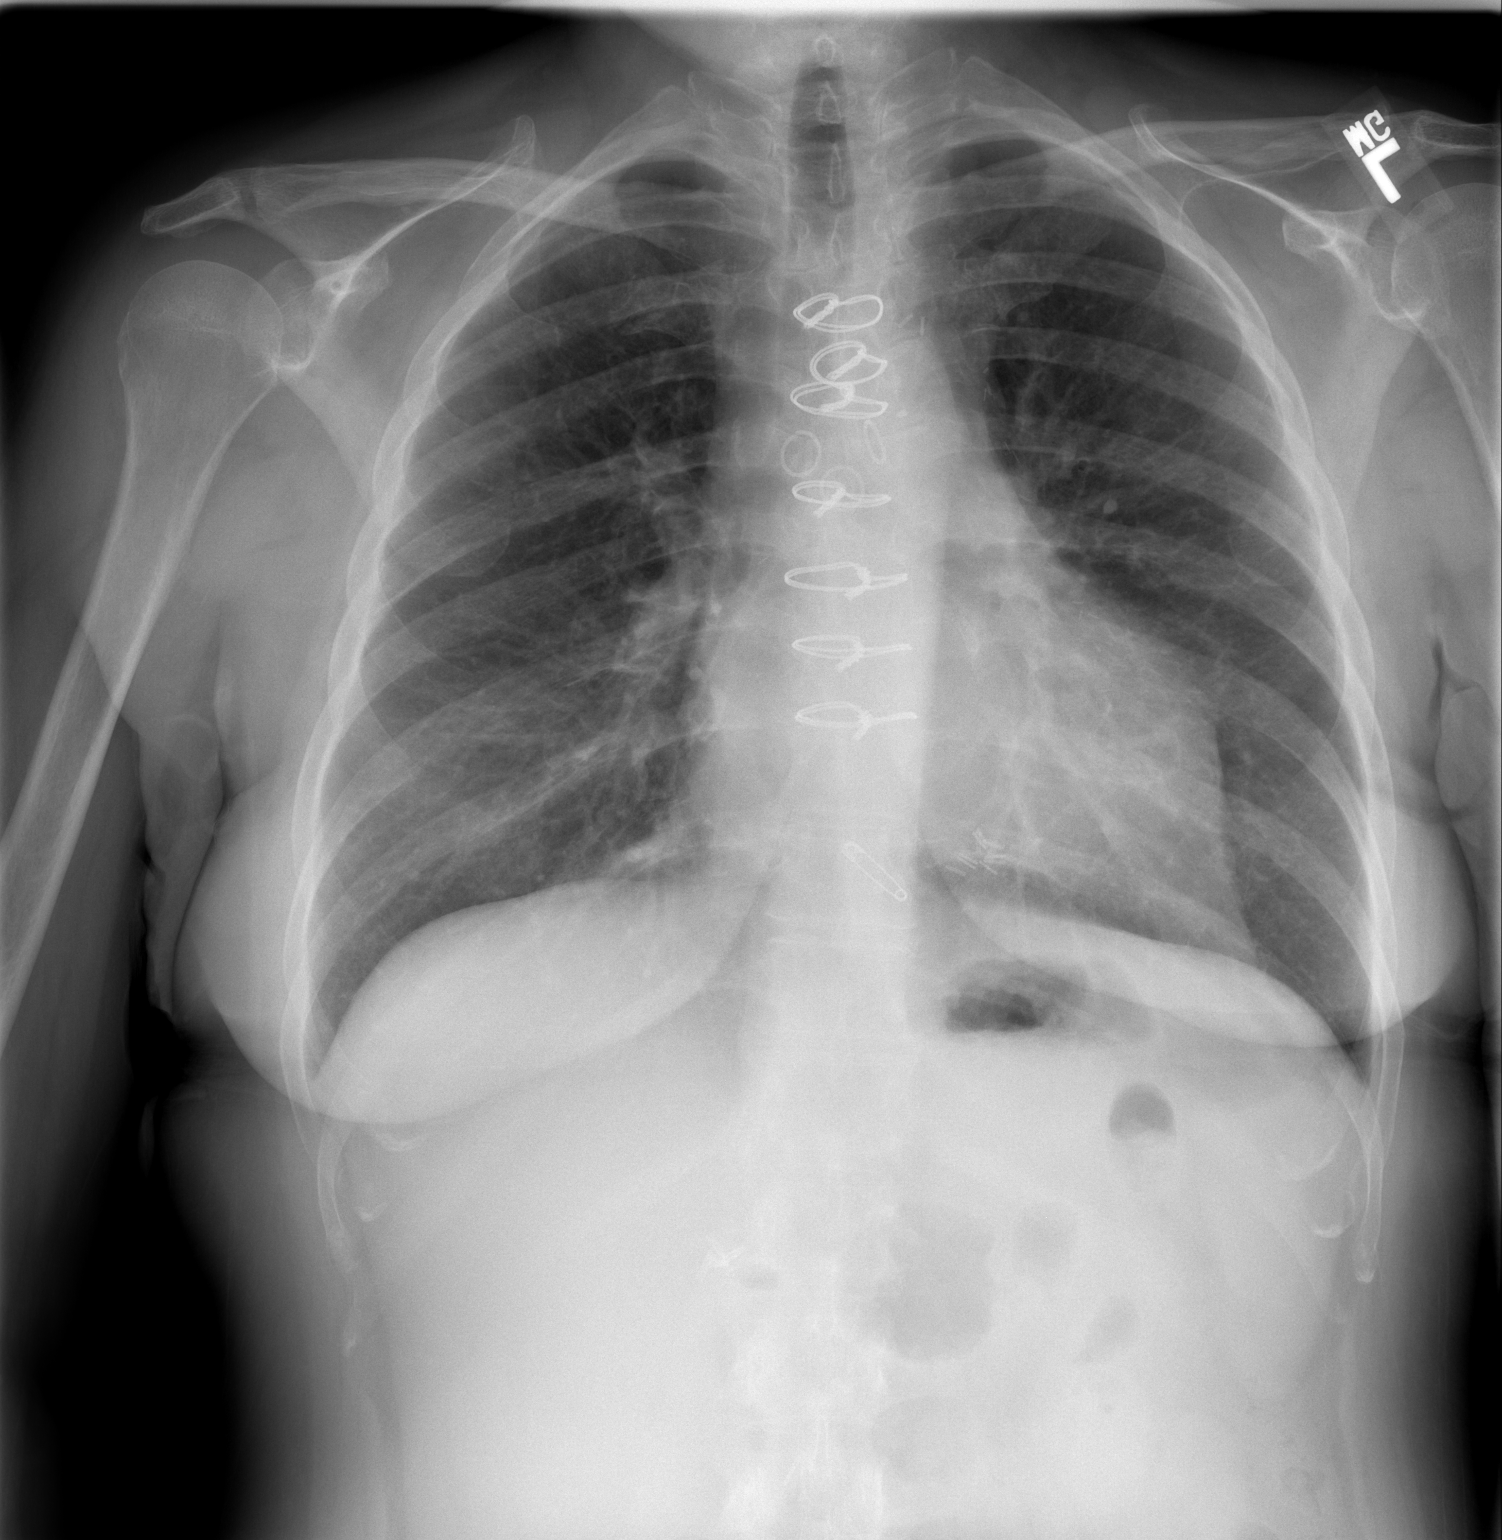

[w chest lat]
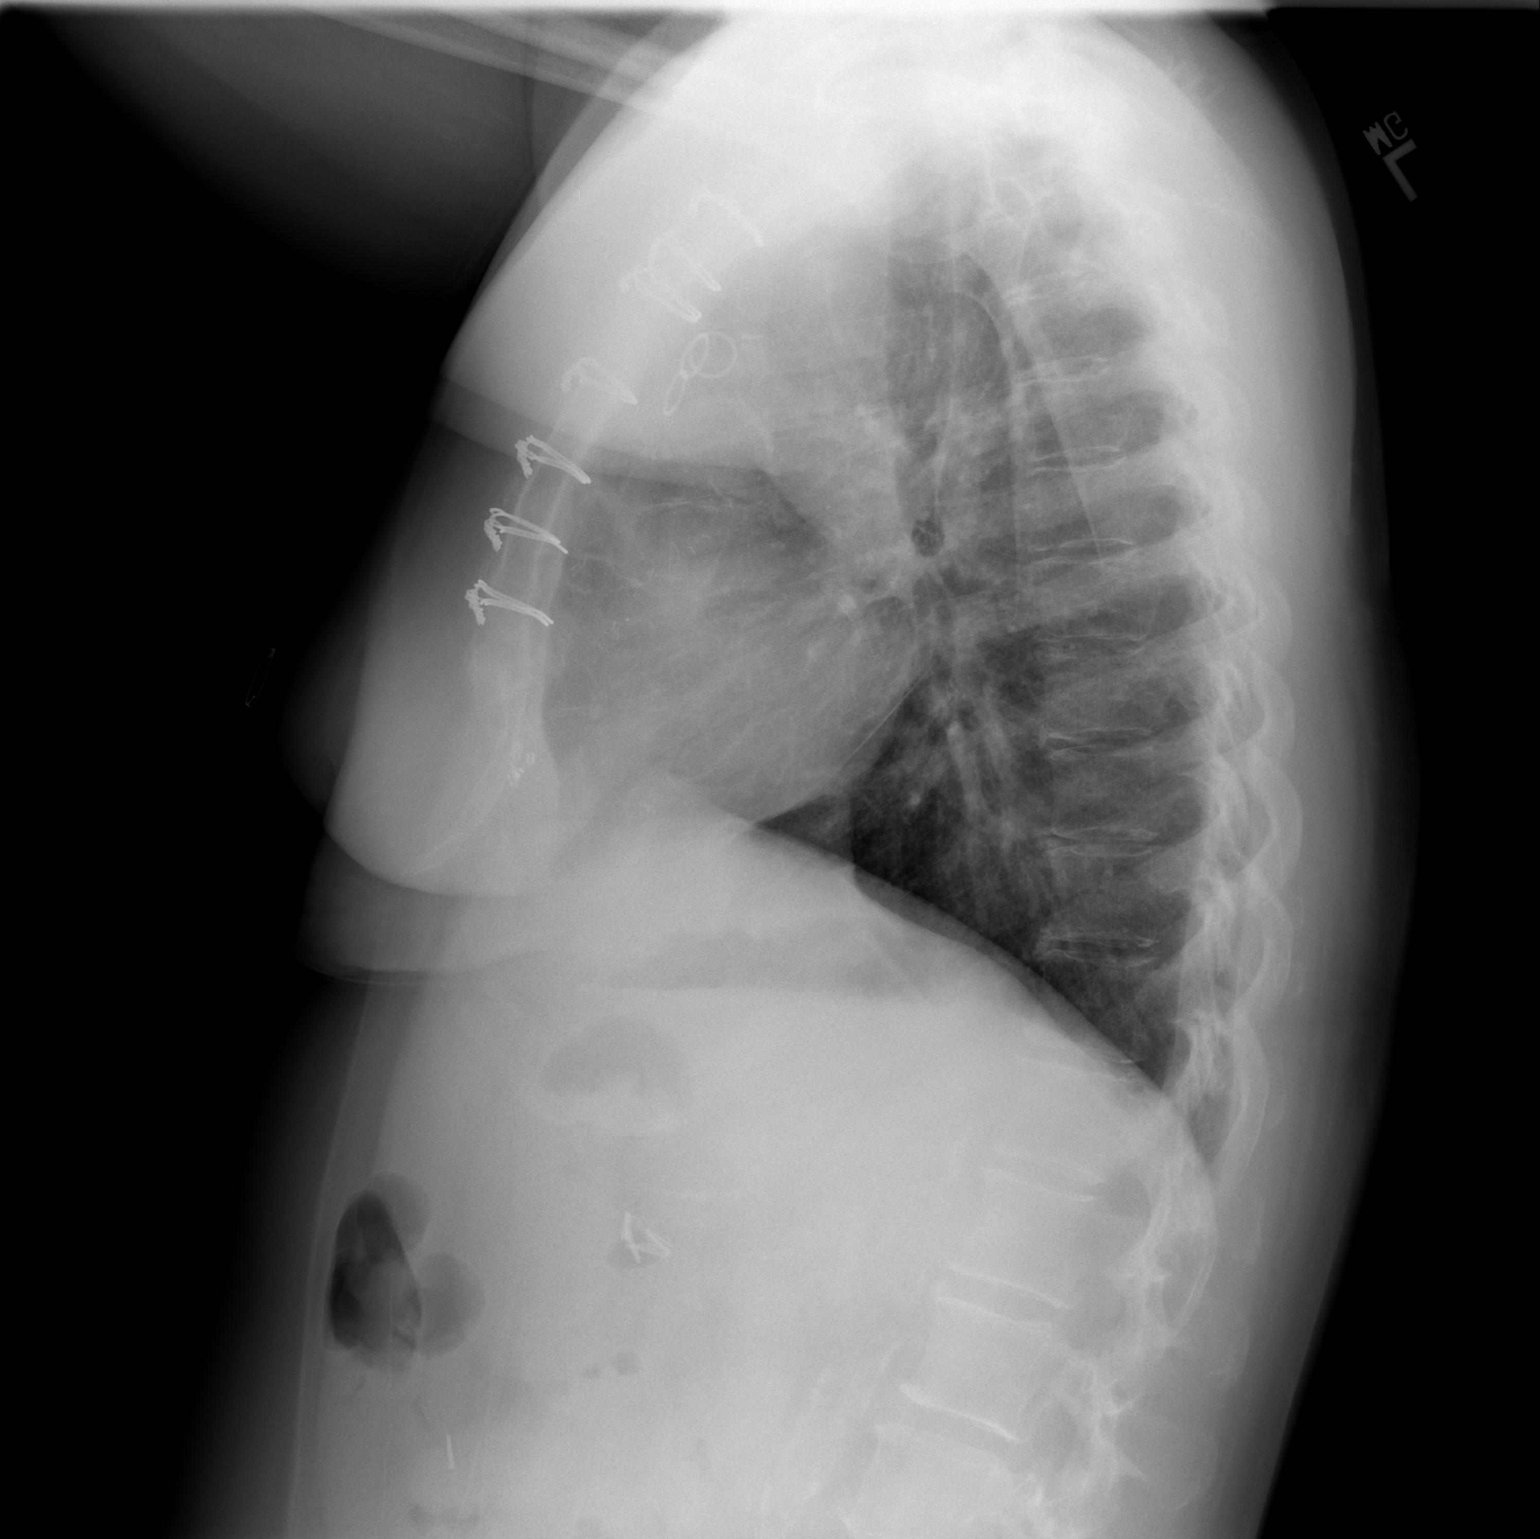

[2 of 2 positions shown; findings below may reference images not displayed]

FINDINGS: Lateral view degraded by patient arm position.

Prior median sternotomy.  Gay pin is positioned anterior to the
chest, superficial to the patient. Midline trachea.  Mild
cardiomegaly. Mediastinal contours otherwise within normal limits.
The previously described pleural effusions have resolved.  Minimal
pleural thickening is suggested posteriorly.

Resolution of previous described bibasilar atelectasis.
Cholecystectomy clips.
IMPRESSION: Cardiomegaly with resolution of bibasilar atelectasis and bilateral
pleural fluid.

## 2011-10-10 ENCOUNTER — Ambulatory Visit: Payer: Medicaid Other | Admitting: Family Medicine

## 2011-11-04 ENCOUNTER — Ambulatory Visit (INDEPENDENT_AMBULATORY_CARE_PROVIDER_SITE_OTHER): Payer: Medicaid Other | Admitting: Family Medicine

## 2011-11-04 ENCOUNTER — Encounter: Payer: Self-pay | Admitting: Family Medicine

## 2011-11-04 DIAGNOSIS — G629 Polyneuropathy, unspecified: Secondary | ICD-10-CM

## 2011-11-04 DIAGNOSIS — I739 Peripheral vascular disease, unspecified: Secondary | ICD-10-CM

## 2011-11-04 DIAGNOSIS — E669 Obesity, unspecified: Secondary | ICD-10-CM

## 2011-11-04 DIAGNOSIS — F329 Major depressive disorder, single episode, unspecified: Secondary | ICD-10-CM

## 2011-11-04 DIAGNOSIS — G589 Mononeuropathy, unspecified: Secondary | ICD-10-CM

## 2011-11-04 DIAGNOSIS — E785 Hyperlipidemia, unspecified: Secondary | ICD-10-CM

## 2011-11-04 DIAGNOSIS — I10 Essential (primary) hypertension: Secondary | ICD-10-CM

## 2011-11-04 DIAGNOSIS — E1159 Type 2 diabetes mellitus with other circulatory complications: Secondary | ICD-10-CM

## 2011-11-04 DIAGNOSIS — I798 Other disorders of arteries, arterioles and capillaries in diseases classified elsewhere: Secondary | ICD-10-CM

## 2011-11-04 DIAGNOSIS — Z0000 Encounter for general adult medical examination without abnormal findings: Secondary | ICD-10-CM

## 2011-11-04 LAB — POCT GLYCOSYLATED HEMOGLOBIN (HGB A1C): Hemoglobin A1C: 9.1

## 2011-11-04 MED ORDER — INSULIN ASPART 100 UNIT/ML ~~LOC~~ SOLN: 10.0000 [IU] | Freq: Three times a day (TID) | SUBCUTANEOUS | Status: DC

## 2011-11-04 MED ORDER — HYDROXYZINE HCL 25 MG PO TABS: 25.0000 mg | ORAL_TABLET | Freq: Every day | ORAL | Status: AC

## 2011-11-04 MED ORDER — INSULIN GLARGINE 100 UNIT/ML ~~LOC~~ SOLN: 75.0000 [IU] | Freq: Two times a day (BID) | SUBCUTANEOUS | Status: DC

## 2011-11-04 MED ORDER — LISINOPRIL 10 MG PO TABS: 20.0000 mg | ORAL_TABLET | Freq: Every day | ORAL | Status: DC

## 2011-11-04 MED ORDER — CILOSTAZOL 50 MG PO TABS: 50.0000 mg | ORAL_TABLET | Freq: Two times a day (BID) | ORAL | Status: AC

## 2011-11-04 MED ORDER — AMLODIPINE BESYLATE 5 MG PO TABS: 5.0000 mg | ORAL_TABLET | Freq: Every day | ORAL | Status: DC

## 2011-11-04 NOTE — Assessment & Plan Note (Signed)
History of peripheral vascular disease, do think that this is likely more because of patient's bilateral lower Jevity pain. Patient does have 1+ pulses on physical exam. Patient has ultrasound Dopplers scheduled in the next 2 weeks via Dr. Gery Pray. At this point will put her on Pletal and see if this improves at all. Followup in 2 weeks.

## 2011-11-04 NOTE — Assessment & Plan Note (Signed)
Discussed at length with patient again patient denies any suicidal or homicidal ideation but does seem to be a little more down than usual. Patient is also insomnia is likely secondary to more underlying depression. Discussed the potential of actually treating properly the patient is known to start any other medication except for something to help with sleep. We'll do hydroxyzine low dose at night and monitor closely. We'll readdress again in 3 weeks.

## 2011-11-04 NOTE — Patient Instructions (Signed)
It is good to see you. I will get some labs on you today and I will call you with the results. You should schedule a colonoscopy when you have a chance. I am giving you a medicine for sleep. It is called hydroxyzine take one pill at night. I when she to increase her Lantus to 70 units twice a day. I when she to also do NovoLog 10 units with meals. For your blood pressure I'm also going to add a low-dose amlodipine to see if this will help. For your lower extremity pain and going to give you a medicine to see if it helps called Pletal.

## 2011-11-04 NOTE — Assessment & Plan Note (Signed)
Discussed proper use of Lantus in addition to when she's taking twice a day no y. Change NovoLog with meals and divided up properly. We'll see how patient does on this protocol and hopefully was notice improvement. A1c is minorly improved but likely we can get below 9 if patient is compliant.

## 2011-11-04 NOTE — Progress Notes (Signed)
  Subjective:    Patient ID: [name], female    DOB: 1960/12/09, 51 y.o.   MRN: [id number]  HPI  1. Hypertension Blood pressure at home: Not checking Blood pressure today: 160/100 and recheck 150/92 Taking Meds: Yes and has increased Metroprolol and lisinopril recently. Side effects: No ROS: Denies headache visual changes nausea, vomiting, chest pain or abdominal pain or shortness of breath.   Diabetes:  High at home:readings of high Low at home:to 50 Taking medications:yesbut got confused and is taking NovoLog twice a day and only taking Lantus as needed. Side effects:no  ROS: denies fever, chills, dizziness, loss of conscieness, polyuria poly dipsia numbness or tingling in extremities or chest pain. Lab Results  Component Value Date   HGBA1C 9.6 05/05/2011  patient is due today.  Cholesterol  Lab Results  Component Value Date   CHOL 284* 02/04/2011   HDL 37* 02/04/2011   LDLDIRECT 200* 07/08/2010   TRIG 414* 02/04/2011   CHOLHDL 7.7 02/04/2011  Takes her statin intermittently.  Depression: patient was on Effexor seemed to be doing well the patient stopped it stating that she didn't feel like she needed it anymore. Patient denies any suicidal or homicidal ideation but does state that she is not doing activities as she once was such as walking and also having some insomnia. Patient though would like to not take medications as possible.  Chronic pain and chronic headaches. Patient's has been seen in the pain clinic for some time she is out of line with her doctor there stating that he doesn't come in the room anymore for her to be seen. Now goes to the headache clinic and is enjoying much more.  Patient is also complaining of bilateral leg pain. Patient states that starts at her feet goes up toward her knees. Patient states is worse at the end of a long day when she is working or when she walks long distances. Patient denies swelling denies redness denies fevers or  chills.  Patient still smokes and does not want to talk about that today. Review of Systems  As stated in the history of present illness Past medical surgical history reviewed and no changes noted    Objective:   Physical Exam vitals reviewed  Generally: much better color than usual scar on her right side of neck extending to chest wall, well healed.   CV RRR no murmur appreciated this time. Pul: CTAB Abd:  BS +,  Afebrile NT, ND Ext:  no edema at ankles. +1 distal pulses neurovascularly intact.    Assessment & Plan:

## 2011-11-04 NOTE — Assessment & Plan Note (Signed)
Still elevated at this time. We'll continue to monitor add amlodipine.patient will return in 2 weeks for repeat check. Dr. Gery Pray had change patient is well increasing her lisinopril and metoprolol this week.

## 2011-11-04 NOTE — Assessment & Plan Note (Signed)
We'll check again, very concerned now that patient has been noncompliant with her medications. Patient is a significant risk for reinfarction or CVA.

## 2011-11-05 LAB — COMPREHENSIVE METABOLIC PANEL
ALT: 12 U/L (ref 0–35)
AST: 11 U/L (ref 0–37)
Alkaline Phosphatase: 101 U/L (ref 39–117)
BUN: 26 mg/dL — ABNORMAL HIGH (ref 6–23)
Creat: 0.88 mg/dL (ref 0.50–1.10)
Total Bilirubin: 0.3 mg/dL (ref 0.3–1.2)

## 2011-11-05 LAB — VITAMIN B12: Vitamin B-12: 386 pg/mL (ref 211–911)

## 2011-11-05 LAB — MAGNESIUM: Magnesium: 1.9 mg/dL (ref 1.5–2.5)

## 2011-11-07 ENCOUNTER — Telehealth: Payer: Self-pay | Admitting: Family Medicine

## 2011-11-07 MED ORDER — CYANOCOBALAMIN 1000 MCG PO TABS: 1000.0000 ug | ORAL_TABLET | Freq: Every day | ORAL | Status: AC

## 2011-11-07 MED ORDER — ERGOCALCIFEROL 1.25 MG (50000 UT) PO CAPS: 50000.0000 [IU] | ORAL_CAPSULE | ORAL | Status: AC

## 2011-11-07 NOTE — Telephone Encounter (Signed)
Called patient back and told her will replace vitamin D.  Also to take OTC B12.

## 2011-11-22 ENCOUNTER — Ambulatory Visit: Payer: Medicaid Other | Admitting: Family Medicine

## 2011-11-29 ENCOUNTER — Ambulatory Visit: Payer: Medicaid Other | Admitting: Family Medicine

## 2011-12-19 ENCOUNTER — Ambulatory Visit: Payer: Medicaid Other | Admitting: Family Medicine

## 2012-01-02 ENCOUNTER — Ambulatory Visit: Payer: Medicaid Other | Admitting: Family Medicine

## 2012-01-02 ENCOUNTER — Telehealth: Payer: Self-pay | Admitting: Family Medicine

## 2012-01-02 MED ORDER — GABAPENTIN 100 MG PO CAPS: 100.0000 mg | ORAL_CAPSULE | Freq: Three times a day (TID) | ORAL | Status: DC

## 2012-01-02 NOTE — Telephone Encounter (Signed)
Sent in Neurontin for her to try. Please call patient.

## 2012-01-02 NOTE — Telephone Encounter (Signed)
Pt states that she cant take neurontin per her cardiologist.  States that the MD mentioned Lyrica .She has asked around and her friends who are on this really like it.  Wants to know if MD can call this in.  Advised that I would send message back to MD.  Also reminder pt about appt on 01/11/12 as she has many noshows. Donja Tipping, Maryjo Rochester

## 2012-01-02 NOTE — Telephone Encounter (Signed)
Patient is calling because she wants meds for her legs and feet.  They hurt so bad she can't work.  She does have an appt on 5/1.

## 2012-01-03 MED ORDER — PREGABALIN 25 MG PO CAPS: 25.0000 mg | ORAL_CAPSULE | Freq: Three times a day (TID) | ORAL | Status: DC

## 2012-01-03 NOTE — Telephone Encounter (Signed)
Pt informed. [name]  

## 2012-01-03 NOTE — Telephone Encounter (Signed)
Willing to try the patient would have to come pick up prescription. Have placed prescription up front. Could you please feel patient that this is available to her but same potential side effects as with Neurontin and her heart condition but we will go a very very low dose.

## 2012-01-11 ENCOUNTER — Ambulatory Visit: Payer: Medicaid Other | Admitting: Family Medicine

## 2012-01-17 ENCOUNTER — Ambulatory Visit: Payer: Medicaid Other | Admitting: Family Medicine

## 2012-02-02 ENCOUNTER — Ambulatory Visit: Payer: Medicaid Other | Admitting: Family Medicine

## 2012-02-08 ENCOUNTER — Ambulatory Visit: Payer: Medicaid Other | Admitting: Family Medicine

## 2012-02-10 ENCOUNTER — Encounter: Payer: Self-pay | Admitting: Family Medicine

## 2012-02-10 ENCOUNTER — Ambulatory Visit: Payer: Medicaid Other | Admitting: Family Medicine

## 2012-03-30 ENCOUNTER — Ambulatory Visit: Payer: Medicaid Other | Admitting: Family Medicine

## 2012-04-12 ENCOUNTER — Ambulatory Visit: Payer: Medicaid Other | Admitting: Family Medicine

## 2012-04-23 ENCOUNTER — Ambulatory Visit: Payer: Medicaid Other | Admitting: Family Medicine

## 2012-04-25 ENCOUNTER — Telehealth: Payer: Self-pay | Admitting: Family Medicine

## 2012-04-25 NOTE — Telephone Encounter (Signed)
Reviewed patient's problem list while Angelique Blonder had her on the phone.  Given the diagnoses of DM, Hyperlipidemia, Obesity, Tobacco Abuse, Narcotic Abuse, HTN, CAD and PVD I felt it was best for her to go straight to ED.  Patient refused stating that she did not feel like going there just to wait.  I replied that if she presents with chest pain she would not wait and would be seen immediately.  Will route this note to her PCP.

## 2012-04-25 NOTE — Telephone Encounter (Signed)
Pt called to make appt for today to be seen for chest pain and nausea.  I spoke with Stevan Born and she advised that she go to ED to be accessed.  Patient did not want to go "she didn't want to go and sit for a long time",  I gave her reasons that she needed to go there instead of here, but she stated that she would call back on Monday to be seen by her doctor.

## 2012-04-26 ENCOUNTER — Ambulatory Visit: Payer: Medicaid Other | Admitting: Family Medicine

## 2012-04-28 DIAGNOSIS — R079 Chest pain, unspecified: Secondary | ICD-10-CM

## 2012-05-01 ENCOUNTER — Ambulatory Visit (HOSPITAL_COMMUNITY)
Admission: RE | Admit: 2012-05-01 | Discharge: 2012-05-01 | Disposition: A | Discharge status: Home / Self Care | Payer: Medicaid Other | Source: Ambulatory Visit | Attending: Family Medicine | Admitting: Family Medicine

## 2012-05-01 ENCOUNTER — Encounter: Payer: Self-pay | Admitting: Family Medicine

## 2012-05-01 ENCOUNTER — Ambulatory Visit (INDEPENDENT_AMBULATORY_CARE_PROVIDER_SITE_OTHER): Payer: Medicaid Other | Admitting: Family Medicine

## 2012-05-01 VITALS — BP 139/92 | HR 111 | Temp 98.3°F | Ht 65.0 in | Wt 144.0 lb

## 2012-05-01 DIAGNOSIS — I2510 Atherosclerotic heart disease of native coronary artery without angina pectoris: Secondary | ICD-10-CM

## 2012-05-01 DIAGNOSIS — R9431 Abnormal electrocardiogram [ECG] [EKG]: Secondary | ICD-10-CM | POA: Insufficient documentation

## 2012-05-01 DIAGNOSIS — R51 Headache: Secondary | ICD-10-CM

## 2012-05-01 DIAGNOSIS — R079 Chest pain, unspecified: Secondary | ICD-10-CM

## 2012-05-01 DIAGNOSIS — G44209 Tension-type headache, unspecified, not intractable: Secondary | ICD-10-CM

## 2012-05-01 DIAGNOSIS — F329 Major depressive disorder, single episode, unspecified: Secondary | ICD-10-CM

## 2012-05-01 DIAGNOSIS — F17200 Nicotine dependence, unspecified, uncomplicated: Secondary | ICD-10-CM

## 2012-05-01 MED ORDER — DIPHENHYDRAMINE HCL 25 MG PO CAPS
50.0000 mg | ORAL_CAPSULE | Freq: Once | ORAL | Status: AC
  Administered 2012-05-01: 50 mg via ORAL

## 2012-05-01 MED ORDER — NITROGLYCERIN 0.4 MG SL SUBL: 0.4000 mg | SUBLINGUAL_TABLET | SUBLINGUAL | Status: DC | PRN

## 2012-05-01 MED ORDER — BUPROPION HCL ER (SR) 150 MG PO TB12: 150.0000 mg | ORAL_TABLET | Freq: Two times a day (BID) | ORAL | Status: DC

## 2012-05-01 MED ORDER — PREGABALIN 25 MG PO CAPS: 50.0000 mg | ORAL_CAPSULE | Freq: Three times a day (TID) | ORAL | Status: DC

## 2012-05-01 MED ORDER — PROMETHAZINE HCL 25 MG/ML IJ SOLN
25.0000 mg | Freq: Once | INTRAMUSCULAR | Status: AC
  Administered 2012-05-01: 25 mg via INTRAMUSCULAR

## 2012-05-01 MED ORDER — BUPROPION HCL ER (SR) 100 MG PO TB12: 100.0000 mg | ORAL_TABLET | Freq: Two times a day (BID) | ORAL | Status: DC

## 2012-05-01 NOTE — Assessment & Plan Note (Addendum)
A: Admits to intermittently depressed mood. Recent social stressors seem to be contributing to both headache nad chest pain. P: Increased lyrica for mood control and peripheral neuropathy Start wellbutrin for mood control and smoking cessation Patient not interested in therapy at this time. She would like a therapy dog, needs note for her apartment. Will defer this to PCP. Discussed with patient that CBT would be helpful.  Close f/u with PCP in two weeks.

## 2012-05-01 NOTE — Progress Notes (Signed)
Subjective:     Patient ID: [name], female   DOB: 11-09-1960, 51 y.o.   MRN: [id number]  HPI 51 yo F with a known history of CAD s/p repeat triple CABG in 5/30 2012, HTN, HLD and DM presents to discuss the following:  1. Chest pain: intermittent, non-exertional, numbness over L chest, non-radiating, worse with stress. Better with lack of stress. Recent stressors: separation from husband in May 2013, increase work 12 hrs/day 6 days per week, daughter 3 yo living with dad. She has been out of nitroglycerin. She is compliant with her BB.  She does not feels as if her chest pain is cardiac. She denies sweating, nausea, GI upset, SOB, syncope or presyncope.   2. Tobacco: 1/2 PPD. Ready to quit. Knows she has to quit.   3. Headache: behind the eyes up to back of head. Sharp and tight. 8-9/10. Present x 2 weeks. No neuro deficits/changes, head trauma or nausea. Has tried: tylenol and NSAIDs w/o relief. Does not sleep well.   Review of Systems As per HPI Psych: intermittently depressed mood, no suicidal ideation Cards: pain in legs and feet. Has never taken pletal.     Objective:   Physical Exam BP 139/92  Pulse 111  Temp 98.3 F (36.8 C) (Oral)  Ht 5\' 5"  (1.651 m)  Wt 144 lb (65.318 kg)  BMI 23.96 kg/m2 General appearance: alert, cooperative and no distress Head: Normocephalic, without obvious abnormality, atraumatic Eyes: conjunctivae/corneas clear. PERRL, EOM's intact. Fundi benign. Ears: normal TM's and external ear canals both ears Nose: Nares normal. Septum midline. Mucosa normal. No drainage or sinus tenderness. Throat: lips, mucosa, and tongue normal; teeth and gums normal Neck: no adenopathy, no carotid bruit, no JVD, supple, symmetrical, trachea midline and thyroid not enlarged, symmetric, no tenderness/mass/nodules Lungs: clear to auscultation bilaterally Heart: regular rate and rhythm, S1, S2 normal, no murmur, click, rub or gallop Extremities: extremities normal,  atraumatic, no cyanosis or edema Pulses: 2+ and symmetric Neurologic: Alert and oriented X 3, normal strength and tone. Normal symmetric reflexes. Normal coordination and gait  EKG: normal EKG, normal sinus rhythm, normal sinus rhythm, RBBB, new R axis deviation compared to previous EKG. No ST elevation or depression.     Assessment and Plan:

## 2012-05-01 NOTE — Assessment & Plan Note (Signed)
A: tension headache no evidence of intracranial pathology.  P:  -avoid NSAIDs -tylenol 1000 mg 2-3 x per day prn -benadryl and phenergan given in office today.

## 2012-05-01 NOTE — Patient Instructions (Addendum)
Cashae,  Thank you for coming in today.  1. Headache: tension type. Avoid NSAIDs. Sleep and stress control is important. I recommend tylenol 1000 mg 2-3 times daily for bad headache days.   2. Chest pain: stress related. Start Wellbutrin 150 mg daily x 3 days then twice daily. Increased lyrica to 50 mg three time daily. Add light exercise as stress relief (walking in the evening). Use nitroglycerin if pain feels like cardiac pain (w). Be careful with nitroglycerin as it can induce headache.   *please call EMS if you feel chest pain is worsening: worse with exertion, pressure, sweating, nausea  3. Smoking cessation: Wellbutrin 150 mg daily x 3 days, then twice daily. Set quit date.  Smoking cessation support: smoking cessation hotline: 1-800-QUIT-NOW.  Here is the number to the smoking cessation classes at [facility]: [phone].  Dr. Armen Pickup  F/u in 2 weeks with Dr. Konrad Dolores.

## 2012-05-01 NOTE — Assessment & Plan Note (Signed)
A: ready to quit. Plan: Zyban Smoking cessation support  per AVS.

## 2012-05-01 NOTE — Assessment & Plan Note (Signed)
A: no evidence of cardiac chest pain in history. Normal exam. New RVH on EKG.  P:  -treat mood. -refill nitroglycerin -recommend cardiology referral for non-exercise stress test if symptoms persist.

## 2012-05-08 ENCOUNTER — Ambulatory Visit: Payer: Medicaid Other | Admitting: Family Medicine

## 2012-05-16 ENCOUNTER — Ambulatory Visit (INDEPENDENT_AMBULATORY_CARE_PROVIDER_SITE_OTHER): Payer: Medicaid Other | Admitting: Family Medicine

## 2012-05-16 ENCOUNTER — Encounter: Payer: Self-pay | Admitting: Family Medicine

## 2012-05-16 VITALS — BP 117/80 | HR 98 | Temp 98.1°F | Ht 65.0 in | Wt 151.0 lb

## 2012-05-16 DIAGNOSIS — I798 Other disorders of arteries, arterioles and capillaries in diseases classified elsewhere: Secondary | ICD-10-CM

## 2012-05-16 DIAGNOSIS — E1142 Type 2 diabetes mellitus with diabetic polyneuropathy: Secondary | ICD-10-CM

## 2012-05-16 DIAGNOSIS — E1149 Type 2 diabetes mellitus with other diabetic neurological complication: Secondary | ICD-10-CM

## 2012-05-16 DIAGNOSIS — F329 Major depressive disorder, single episode, unspecified: Secondary | ICD-10-CM

## 2012-05-16 DIAGNOSIS — E1159 Type 2 diabetes mellitus with other circulatory complications: Secondary | ICD-10-CM

## 2012-05-16 DIAGNOSIS — E785 Hyperlipidemia, unspecified: Secondary | ICD-10-CM

## 2012-05-16 DIAGNOSIS — R609 Edema, unspecified: Secondary | ICD-10-CM

## 2012-05-16 DIAGNOSIS — R51 Headache: Secondary | ICD-10-CM

## 2012-05-16 DIAGNOSIS — G4700 Insomnia, unspecified: Secondary | ICD-10-CM

## 2012-05-16 DIAGNOSIS — R600 Localized edema: Secondary | ICD-10-CM | POA: Insufficient documentation

## 2012-05-16 MED ORDER — ZOLPIDEM TARTRATE 10 MG PO TABS: 5.0000 mg | ORAL_TABLET | Freq: Every evening | ORAL | Status: DC | PRN

## 2012-05-16 NOTE — Patient Instructions (Signed)
Thank you for coming in today I believe your headache will improve as your overall mood improves Please continue taking all of yoru medications as prescribed Please come back when you are fasting for blood work Please come back to see me in 4 weeks to review your blood work and your overall health.

## 2012-05-17 DIAGNOSIS — G4700 Insomnia, unspecified: Secondary | ICD-10-CM | POA: Insufficient documentation

## 2012-05-17 NOTE — Assessment & Plan Note (Signed)
A1C improved significantly. Continue current regimen

## 2012-05-17 NOTE — Assessment & Plan Note (Signed)
Continue lyrica. A1c improved to 7.1 today. Recently made worse w/ increased ambulation
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
## 2012-05-17 NOTE — Assessment & Plan Note (Signed)
Venous insufficiency vs anemia vs low protein vs venous congestion from hepatic/cardiac causes, inflammatory due to spending 12 hrs on feet and recently starting of walking in evenings CBC CMET Improved footwear +/- future imaging

## 2012-05-17 NOTE — Assessment & Plan Note (Addendum)
DC wellbutrin Therapy dog paper provided. Pt to buy dog in 2 weeks and f/u in 4 wks Further in office discussion

## 2012-05-17 NOTE — Assessment & Plan Note (Signed)
Refill Ambien

## 2012-05-17 NOTE — Assessment & Plan Note (Signed)
Stress and anxiety related. Psychosomatic component. No neurological deficits. Continue Tylenol. Will likely improve w/ improvement in stress, depression, and sleep

## 2012-07-12 ENCOUNTER — Other Ambulatory Visit: Payer: Self-pay | Admitting: Family Medicine

## 2012-07-24 ENCOUNTER — Other Ambulatory Visit: Payer: Self-pay | Admitting: Family Medicine

## 2012-07-24 MED ORDER — "INSULIN SYRINGE-NEEDLE U-100 31G X 5/16"" 1 ML MISC": 1.0000 | Status: DC

## 2012-07-24 MED ORDER — ZOLPIDEM TARTRATE 10 MG PO TABS: 5.0000 mg | ORAL_TABLET | Freq: Every evening | ORAL | Status: DC | PRN

## 2012-10-04 ENCOUNTER — Other Ambulatory Visit: Payer: Self-pay | Admitting: *Deleted

## 2012-10-05 ENCOUNTER — Telehealth: Payer: Self-pay | Admitting: *Deleted

## 2012-10-05 ENCOUNTER — Other Ambulatory Visit: Payer: Self-pay | Admitting: Family Medicine

## 2012-10-05 MED ORDER — PREGABALIN 25 MG PO CAPS: 50.0000 mg | ORAL_CAPSULE | Freq: Three times a day (TID) | ORAL | Status: DC

## 2012-10-05 NOTE — Telephone Encounter (Signed)
Pharmacy calling to check on the status of Lyrica refill request.

## 2012-10-12 ENCOUNTER — Other Ambulatory Visit: Payer: Self-pay | Admitting: Family Medicine

## 2012-10-12 MED ORDER — PREGABALIN 25 MG PO CAPS: 50.0000 mg | ORAL_CAPSULE | Freq: Three times a day (TID) | ORAL | Status: DC

## 2012-12-26 ENCOUNTER — Telehealth: Payer: Self-pay | Admitting: Family Medicine
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
## 2012-12-26 NOTE — Telephone Encounter (Signed)
To MD. Gracin Mcpartland Dawn  
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
## 2013-02-14 ENCOUNTER — Emergency Department (HOSPITAL_COMMUNITY)
Admission: EM | Admit: 2013-02-14 | Discharge: 2013-02-14 | Disposition: A | Discharge status: Home / Self Care | Payer: Self-pay | Attending: Emergency Medicine | Admitting: Emergency Medicine

## 2013-02-14 ENCOUNTER — Encounter (HOSPITAL_COMMUNITY): Payer: Self-pay | Admitting: Emergency Medicine

## 2013-02-14 DIAGNOSIS — Z8719 Personal history of other diseases of the digestive system: Secondary | ICD-10-CM | POA: Insufficient documentation

## 2013-02-14 DIAGNOSIS — Z862 Personal history of diseases of the blood and blood-forming organs and certain disorders involving the immune mechanism: Secondary | ICD-10-CM | POA: Insufficient documentation

## 2013-02-14 DIAGNOSIS — Z7982 Long term (current) use of aspirin: Secondary | ICD-10-CM | POA: Insufficient documentation

## 2013-02-14 DIAGNOSIS — E119 Type 2 diabetes mellitus without complications: Secondary | ICD-10-CM | POA: Insufficient documentation

## 2013-02-14 DIAGNOSIS — I252 Old myocardial infarction: Secondary | ICD-10-CM | POA: Insufficient documentation

## 2013-02-14 DIAGNOSIS — R011 Cardiac murmur, unspecified: Secondary | ICD-10-CM | POA: Insufficient documentation

## 2013-02-14 DIAGNOSIS — Z8659 Personal history of other mental and behavioral disorders: Secondary | ICD-10-CM | POA: Insufficient documentation

## 2013-02-14 DIAGNOSIS — L0201 Cutaneous abscess of face: Secondary | ICD-10-CM | POA: Insufficient documentation

## 2013-02-14 DIAGNOSIS — Z951 Presence of aortocoronary bypass graft: Secondary | ICD-10-CM | POA: Insufficient documentation

## 2013-02-14 DIAGNOSIS — Z794 Long term (current) use of insulin: Secondary | ICD-10-CM | POA: Insufficient documentation

## 2013-02-14 DIAGNOSIS — I10 Essential (primary) hypertension: Secondary | ICD-10-CM | POA: Insufficient documentation

## 2013-02-14 DIAGNOSIS — Z8639 Personal history of other endocrine, nutritional and metabolic disease: Secondary | ICD-10-CM | POA: Insufficient documentation

## 2013-02-14 DIAGNOSIS — Z7902 Long term (current) use of antithrombotics/antiplatelets: Secondary | ICD-10-CM | POA: Insufficient documentation

## 2013-02-14 DIAGNOSIS — F17200 Nicotine dependence, unspecified, uncomplicated: Secondary | ICD-10-CM | POA: Insufficient documentation

## 2013-02-14 DIAGNOSIS — L03211 Cellulitis of face: Secondary | ICD-10-CM | POA: Insufficient documentation

## 2013-02-14 DIAGNOSIS — Z79899 Other long term (current) drug therapy: Secondary | ICD-10-CM | POA: Insufficient documentation

## 2013-02-14 DIAGNOSIS — L0291 Cutaneous abscess, unspecified: Secondary | ICD-10-CM

## 2013-02-14 DIAGNOSIS — Z8673 Personal history of transient ischemic attack (TIA), and cerebral infarction without residual deficits: Secondary | ICD-10-CM | POA: Insufficient documentation

## 2013-02-14 DIAGNOSIS — Z880 Allergy status to penicillin: Secondary | ICD-10-CM | POA: Insufficient documentation

## 2013-02-14 LAB — BASIC METABOLIC PANEL
BUN: 16 mg/dL (ref 6–23)
CO2: 29 mEq/L (ref 19–32)
Calcium: 9.4 mg/dL (ref 8.4–10.5)
Glucose, Bld: 258 mg/dL — ABNORMAL HIGH (ref 70–99)
Sodium: 143 mEq/L (ref 135–145)

## 2013-02-14 LAB — CBC WITH DIFFERENTIAL/PLATELET
Eosinophils Absolute: 0.1 10*3/uL (ref 0.0–0.7)
Eosinophils Relative: 2 % (ref 0–5)
HCT: 39.6 % (ref 36.0–46.0)
Hemoglobin: 13 g/dL (ref 12.0–15.0)
Lymphocytes Relative: 25 % (ref 12–46)
Lymphs Abs: 2.1 10*3/uL (ref 0.7–4.0)
MCH: 29.1 pg (ref 26.0–34.0)
MCV: 88.8 fL (ref 78.0–100.0)
Monocytes Relative: 2 % — ABNORMAL LOW (ref 3–12)
Platelets: 336 10*3/uL (ref 150–400)
RBC: 4.46 MIL/uL (ref 3.87–5.11)
WBC: 8.2 10*3/uL (ref 4.0–10.5)

## 2013-02-14 MED ORDER — CLINDAMYCIN HCL 150 MG PO CAPS: 150.0000 mg | ORAL_CAPSULE | Freq: Four times a day (QID) | ORAL | Status: DC

## 2013-02-14 MED ORDER — LIDOCAINE HCL (PF) 2 % IJ SOLN
4.0000 mL | Freq: Once | INTRAMUSCULAR | Status: DC
  Filled 2013-02-14: qty 10

## 2013-02-14 MED ORDER — HYDROCODONE-ACETAMINOPHEN 5-325 MG PO TABS: 1.0000 | ORAL_TABLET | ORAL | Status: DC | PRN

## 2013-02-14 MED ORDER — SODIUM CHLORIDE 0.9 % IV SOLN: INTRAVENOUS | Status: DC

## 2013-02-14 MED ORDER — SODIUM CHLORIDE 0.9 % IV BOLUS (SEPSIS)
500.0000 mL | Freq: Once | INTRAVENOUS | Status: AC
  Administered 2013-02-14: 500 mL via INTRAVENOUS

## 2013-02-14 MED ORDER — CLINDAMYCIN PHOSPHATE 600 MG/50ML IV SOLN
600.0000 mg | Freq: Once | INTRAVENOUS | Status: AC
  Administered 2013-02-14: 600 mg via INTRAVENOUS
  Filled 2013-02-14: qty 50

## 2013-02-14 NOTE — ED Provider Notes (Signed)
History  This chart was scribed for Flint Melter, MD by Bennett Scrape, ED Scribe. This patient was seen in room [facility]14/[facility]14 and the patient's care was started at 1:10 PM  CSN: [id number]  Arrival date & time 02/14/13  1148   First MD Initiated Contact with Patient 02/14/13 1310      Chief Complaint  Patient presents with  . Abscess     Patient is a 52 y.o. female presenting with abscess. The history is provided by the patient. No language interpreter was used.  Abscess Location:  Head/neck Head/neck abscess location: under chin. Abscess quality: draining, painful, redness and warmth   Red streaking: no   Duration:  6 days Progression:  Worsening Pain details:    Quality:  Hot   Timing:  Constant   Progression:  Worsening Chronicity:  New Context: diabetes   Relieved by:  Nothing Worsened by:  Nothing tried Ineffective treatments:  None tried Associated symptoms: no fatigue, no fever, no headaches, no nausea and no vomiting   Risk factors: prior abscess     HPI Comments: [name] is a 52 y.o. female who presents to the Emergency Department complaining of 6 days of gradual onset, gradually worsening, constant abscess under the chin. She denies trying to squeeze the area or apply heat. She reports prior episodes of the same but denies any in the same area. She denies trouble breathing , SOB, fever, HA and back pain as associated symptoms. She has a h/o CABG x2, DM which she takes novolog and lantus for. She reports that her CBGs have been within normal range.  PCP is Dr. Konrad Dolores.    Past Medical History  Diagnosis Date  . Depression   . Diabetes mellitus   . GERD (gastroesophageal reflux disease)   . Hyperlipidemia   . Hypertension   . Myocardial infarction   . Stroke   . Heart murmur     Past Surgical History  Procedure Laterality Date  . Endarterectomy  03/02/11  . Laparoscopic cholecystectomy    . Abdominal hysterectomy    . Coronary artery bypass  graft  02/09/11    repeat in 2012    Family History  Problem Relation Age of Onset  . Heart disease Mother   . Hypertension Mother   . Hyperlipidemia Mother   . Heart disease Father   . Hypertension Father     History  Substance Use Topics  . Smoking status: Current Every Day Smoker -- 0.30 packs/day for 40 years    Types: Cigarettes  . Smokeless tobacco: Never Used  . Alcohol Use: No    OB History   Grav Para Term Preterm Abortions TAB SAB Ect Mult Living   2 2 2       2       Review of Systems  Constitutional: Negative for fever and fatigue.  Gastrointestinal: Negative for nausea and vomiting.  Skin: Positive for wound.  Neurological: Negative for headaches.  All other systems reviewed and are negative.    Allergies  Amoxicillin-pot clavulanate and Iohexol  Home Medications   Current Outpatient Rx  Name  Route  Sig  Dispense  Refill  . aspirin 81 MG EC tablet   Oral   Take 81 mg by mouth daily.           . Aspirin-Acetaminophen-Caffeine (GOODY HEADACHE PO)   Oral   Take 1 Package by mouth daily as needed (headace).         . insulin  aspart (NOVOLOG FLEXPEN) 100 UNIT/ML injection   Subcutaneous   Inject 10 Units into the skin 3 (three) times daily before meals. Please give pen if covered, as well as 3 month supply.   1 vial   6   . insulin glargine (LANTUS SOLOSTAR) 100 UNIT/ML injection   Subcutaneous   Inject 75 Units into the skin 2 (two) times daily. Please give 3 month supply.   1 pen   6   . Insulin Syringe-Needle U-100 31G X 5/16" 1 ML MISC   Does not apply   1 each by Does not apply route as directed.   100 each   6   . lisinopril (PRINIVIL,ZESTRIL) 10 MG tablet   Oral   Take 2 tablets (20 mg total) by mouth daily.   180 tablet   1   . Lite Touch Lancets MISC      Use 1 unit as directed twice a day          . metoprolol (LOPRESSOR) 100 MG tablet   Oral   Take 50 mg by mouth 2 (two) times daily. Per Dr. Allyson Sabal         .  PLAVIX 75 MG tablet      TAKE 1 TABLET EVERY DAY   90 tablet   2   . clindamycin (CLEOCIN) 150 MG capsule   Oral   Take 1 capsule (150 mg total) by mouth every 6 (six) hours.   28 capsule   0   . HYDROcodone-acetaminophen (NORCO) 5-325 MG per tablet   Oral   Take 1 tablet by mouth every 4 (four) hours as needed for pain.   20 tablet   0     Triage Vitals: BP 149/104  Pulse 96  Temp(Src) 98.6 F (37 C) (Oral)  Resp 18  Ht 5\' 4"  (1.626 m)  Wt 138 lb (62.596 kg)  BMI 23.68 kg/m2  SpO2 100%  Physical Exam  Nursing note and vitals reviewed. Constitutional: She is oriented to person, place, and time. She appears well-developed and well-nourished.  HENT:  Head: Normocephalic and atraumatic.  3 by 4 cm red raised nodule that is tender and fluctuant under the chin, open area laterally with purulent material extruding   Eyes: Conjunctivae and EOM are normal. Pupils are equal, round, and reactive to light.  Neck: Normal range of motion and phonation normal. Neck supple.  Cardiovascular: Normal rate, regular rhythm and intact distal pulses.   Pulmonary/Chest: Effort normal and breath sounds normal. She exhibits no tenderness.  Abdominal: Soft. She exhibits no distension. There is no tenderness. There is no guarding.  Musculoskeletal: Normal range of motion.  Neurological: She is alert and oriented to person, place, and time. She has normal strength. She exhibits normal muscle tone.  Skin: Skin is warm and dry.  Psychiatric: She has a normal mood and affect. Her behavior is normal. Judgment and thought content normal.    ED Course  Procedures (including critical care time)  DIAGNOSTIC STUDIES: Oxygen Saturation is 100% on room air, normal by my interpretation.    COORDINATION OF CARE: 1:45 PM-Discussed treatment plan which includes I&D with pt at bedside and pt agreed to plan. Advised pt that she needs to f/u with her PCP tomorrow due to her h/o DM.  INCISION AND DRAINAGE  PROCEDURE NOTE: Patient identification was confirmed and verbal consent was obtained. This procedure was performed by Flint Melter, MD at 3:45 PM. Site: left chin Sterile procedures observed Needle size:  25 gauge  Anesthetic used (type and amt): 3 ccs of 2% lidocaine w/o epi Blade size: 11 Drainage: copious amount of pus Complexity: Complex Packing used Site anesthetized, incision made over site, wound drained and explored loculations, rinsed with copious amounts of normal saline, wound packed with sterile gauze, covered with dry, sterile dressing.  Pt tolerated procedure well without complications.  Instructions for care discussed verbally and pt provided with additional written instructions for homecare and f/u.  3:48 Pm- Pt has an appointment to f/u with her PCP.  Labs Reviewed  CBC WITH DIFFERENTIAL - Abnormal; Notable for the following:    Monocytes Relative 2 (*)    All other components within normal limits  BASIC METABOLIC PANEL - Abnormal; Notable for the following:    Glucose, Bld 258 (*)    GFR calc non Af Amer 75 (*)    GFR calc Af Amer 87 (*)    All other components within normal limits   No results found.   1. Abscess       MDM  Facial abscess, treated with I&D. Assess did not require packing, since less than 5 cm. No significant associated cellulitis. Doubt metabolic instability, serious bacterial infection or impending vascular collapse; the patient is stable for discharge.  Nursing Notes Reviewed/ Care Coordinated, and agree without changes. Applicable Imaging Reviewed.  Interpretation of Laboratory Data incorporated into ED treatment   Plan: Home Medications- clindamycin; Home Treatments- warm soaks; Recommended follow up- PCP follow up within one week     I personally performed the services described in this documentation, which was scribed in my presence. The recorded information has been reviewed and is accurate.     Flint Melter,  MD 02/14/13 2157

## 2013-02-14 NOTE — ED Notes (Signed)
Pt presents with abscess to throat area which she first noticed 5-6 days ago. Pt reports "some green-yellow drainage" from area. Pt also report pain to touch. Pt denies pain with swallowing, difficulty talking, difficulty breathing.

## 2013-02-14 NOTE — ED Notes (Signed)
Called patient to room and is not found.

## 2013-02-14 NOTE — ED Notes (Signed)
Patient c/o abscess to throat x6 days. Patient denies any difficulty breathing or swallowing. Reports some drainage yesterday.

## 2013-03-21 ENCOUNTER — Other Ambulatory Visit: Payer: Self-pay

## 2013-04-19 ENCOUNTER — Emergency Department (HOSPITAL_COMMUNITY): Payer: Self-pay

## 2013-04-19 ENCOUNTER — Emergency Department (HOSPITAL_COMMUNITY)
Admission: EM | Admit: 2013-04-19 | Discharge: 2013-04-19 | Disposition: A | Discharge status: Home / Self Care | Payer: Self-pay | Attending: Emergency Medicine | Admitting: Emergency Medicine

## 2013-04-19 ENCOUNTER — Encounter (HOSPITAL_COMMUNITY): Payer: Self-pay | Admitting: *Deleted

## 2013-04-19 DIAGNOSIS — R011 Cardiac murmur, unspecified: Secondary | ICD-10-CM | POA: Insufficient documentation

## 2013-04-19 DIAGNOSIS — Z951 Presence of aortocoronary bypass graft: Secondary | ICD-10-CM | POA: Insufficient documentation

## 2013-04-19 DIAGNOSIS — W1809XA Striking against other object with subsequent fall, initial encounter: Secondary | ICD-10-CM | POA: Insufficient documentation

## 2013-04-19 DIAGNOSIS — Z8639 Personal history of other endocrine, nutritional and metabolic disease: Secondary | ICD-10-CM | POA: Insufficient documentation

## 2013-04-19 DIAGNOSIS — Z7902 Long term (current) use of antithrombotics/antiplatelets: Secondary | ICD-10-CM | POA: Insufficient documentation

## 2013-04-19 DIAGNOSIS — Z7982 Long term (current) use of aspirin: Secondary | ICD-10-CM | POA: Insufficient documentation

## 2013-04-19 DIAGNOSIS — S22000A Wedge compression fracture of unspecified thoracic vertebra, initial encounter for closed fracture: Secondary | ICD-10-CM

## 2013-04-19 DIAGNOSIS — I252 Old myocardial infarction: Secondary | ICD-10-CM | POA: Insufficient documentation

## 2013-04-19 DIAGNOSIS — F17200 Nicotine dependence, unspecified, uncomplicated: Secondary | ICD-10-CM | POA: Insufficient documentation

## 2013-04-19 DIAGNOSIS — Y929 Unspecified place or not applicable: Secondary | ICD-10-CM | POA: Insufficient documentation

## 2013-04-19 DIAGNOSIS — S22009A Unspecified fracture of unspecified thoracic vertebra, initial encounter for closed fracture: Secondary | ICD-10-CM | POA: Insufficient documentation

## 2013-04-19 DIAGNOSIS — Z8659 Personal history of other mental and behavioral disorders: Secondary | ICD-10-CM | POA: Insufficient documentation

## 2013-04-19 DIAGNOSIS — Z79899 Other long term (current) drug therapy: Secondary | ICD-10-CM | POA: Insufficient documentation

## 2013-04-19 DIAGNOSIS — Z794 Long term (current) use of insulin: Secondary | ICD-10-CM | POA: Insufficient documentation

## 2013-04-19 DIAGNOSIS — E119 Type 2 diabetes mellitus without complications: Secondary | ICD-10-CM | POA: Insufficient documentation

## 2013-04-19 DIAGNOSIS — Z8673 Personal history of transient ischemic attack (TIA), and cerebral infarction without residual deficits: Secondary | ICD-10-CM | POA: Insufficient documentation

## 2013-04-19 DIAGNOSIS — I10 Essential (primary) hypertension: Secondary | ICD-10-CM | POA: Insufficient documentation

## 2013-04-19 DIAGNOSIS — Z862 Personal history of diseases of the blood and blood-forming organs and certain disorders involving the immune mechanism: Secondary | ICD-10-CM | POA: Insufficient documentation

## 2013-04-19 DIAGNOSIS — Y9301 Activity, walking, marching and hiking: Secondary | ICD-10-CM | POA: Insufficient documentation

## 2013-04-19 DIAGNOSIS — Z8719 Personal history of other diseases of the digestive system: Secondary | ICD-10-CM | POA: Insufficient documentation

## 2013-04-19 MED ORDER — METHOCARBAMOL 500 MG PO TABS
1000.0000 mg | ORAL_TABLET | Freq: Once | ORAL | Status: AC
  Administered 2013-04-19: 1000 mg via ORAL

## 2013-04-19 MED ORDER — METHOCARBAMOL 500 MG PO TABS
ORAL_TABLET | ORAL | Status: AC
  Filled 2013-04-19: qty 2

## 2013-04-19 MED ORDER — OXYCODONE-ACETAMINOPHEN 5-325 MG PO TABS
2.0000 | ORAL_TABLET | Freq: Once | ORAL | Status: AC
  Administered 2013-04-19: 2 via ORAL

## 2013-04-19 MED ORDER — OXYCODONE-ACETAMINOPHEN 5-325 MG PO TABS
ORAL_TABLET | ORAL | Status: AC
  Filled 2013-04-19: qty 2

## 2013-04-19 MED ORDER — OXYCODONE-ACETAMINOPHEN 5-325 MG PO TABS: 1.0000 | ORAL_TABLET | ORAL | Status: DC | PRN

## 2013-04-19 MED ORDER — HYDROMORPHONE HCL PF 1 MG/ML IJ SOLN
1.0000 mg | Freq: Once | INTRAMUSCULAR | Status: AC
  Administered 2013-04-19: 1 mg via INTRAVENOUS
  Filled 2013-04-19: qty 1

## 2013-04-19 NOTE — ED Notes (Signed)
Fell on  Wooden ramp yesterday, says the ramp was wet and she fell backwards. Pain in hips and back. No HI.  No neck pain.

## 2013-04-21 NOTE — ED Provider Notes (Signed)
CSN: [id number]     Arrival date & time 04/19/13  1148 History     First MD Initiated Contact with Patient 04/19/13 1207     Chief Complaint  Patient presents with  . Fall   (Consider location/radiation/quality/duration/timing/severity/associated sxs/prior Treatment) HPI Comments: [name] is a 52 y.o. Female with mid back and coccyx pain after slipping walking down a wet handicap ramp yesterday,  Landing on her buttocks. Her pain is constant, but worse with movement and certain positions, especially sitting and lying on her back.  She denies hitting her head and had no loc.  She has found no alleviators for her pain.  She denies weakness and numbness in her legs and pain is without radiation. Additional pertinent negatives include abdominal pain chest pain, shortness of breath, neck and headache pain.     The history is provided by the patient.    Past Medical History  Diagnosis Date  . Depression   . Diabetes mellitus   . GERD (gastroesophageal reflux disease)   . Hyperlipidemia   . Hypertension   . Myocardial infarction   . Stroke   . Heart murmur    Past Surgical History  Procedure Laterality Date  . Endarterectomy  03/02/11  . Laparoscopic cholecystectomy    . Abdominal hysterectomy    . Coronary artery bypass graft  02/09/11    repeat in 2012   Family History  Problem Relation Age of Onset  . Heart disease Mother   . Hypertension Mother   . Hyperlipidemia Mother   . Heart disease Father   . Hypertension Father    History  Substance Use Topics  . Smoking status: Current Every Day Smoker -- 0.30 packs/day for 40 years    Types: Cigarettes  . Smokeless tobacco: Never Used  . Alcohol Use: No   OB History   Grav Para Term Preterm Abortions TAB SAB Ect Mult Living   2 2 2       2      Review of Systems  Constitutional: Negative for fever.  Respiratory: Negative for shortness of breath.   Cardiovascular: Negative for chest pain and leg swelling.   Gastrointestinal: Negative for abdominal pain, constipation and abdominal distention.  Genitourinary: Negative for dysuria, urgency, frequency, flank pain and difficulty urinating.  Musculoskeletal: Positive for back pain and arthralgias. Negative for joint swelling and gait problem.  Skin: Negative for rash.  Neurological: Negative for weakness and numbness.    Allergies  Amoxicillin-pot clavulanate and Iohexol  Home Medications   Current Outpatient Rx  Name  Route  Sig  Dispense  Refill  . aspirin 81 MG EC tablet   Oral   Take 81 mg by mouth daily.           . clopidogrel (PLAVIX) 75 MG tablet   Oral   Take 75 mg by mouth daily.         . insulin aspart (NOVOLOG FLEXPEN) 100 UNIT/ML injection   Subcutaneous   Inject 10 Units into the skin 3 (three) times daily before meals. Please give pen if covered, as well as 3 month supply.   1 vial   6   . insulin glargine (LANTUS SOLOSTAR) 100 UNIT/ML injection   Subcutaneous   Inject 75 Units into the skin 2 (two) times daily. Please give 3 month supply.   1 pen   6   . lisinopril (PRINIVIL,ZESTRIL) 10 MG tablet   Oral   Take 2 tablets (20 mg total) by  mouth daily.   180 tablet   1   . metoprolol (LOPRESSOR) 100 MG tablet   Oral   Take 100 mg by mouth 2 (two) times daily. Per Dr. Allyson Sabal         . Insulin Syringe-Needle U-100 31G X 5/16" 1 ML MISC   Does not apply   1 each by Does not apply route as directed.   100 each   6   . Lite Touch Lancets MISC      Use 1 unit as directed twice a day          . oxyCODONE-acetaminophen (PERCOCET/ROXICET) 5-325 MG per tablet   Oral   Take 1 tablet by mouth every 4 (four) hours as needed for pain.   30 tablet   0    BP 151/86  Pulse 109  Temp(Src) 97.3 F (36.3 C) (Oral)  SpO2 100% Physical Exam  Nursing note and vitals reviewed. Constitutional: She appears well-developed and well-nourished.  HENT:  Head: Normocephalic.  Eyes: Conjunctivae are normal.  Neck:  Normal range of motion. Neck supple.  Cardiovascular: Normal rate and intact distal pulses.   Pedal pulses normal.  Pulmonary/Chest: Effort normal.  Abdominal: Soft. Bowel sounds are normal. She exhibits no distension and no mass.  Musculoskeletal: Normal range of motion. She exhibits no edema.       Lumbar back: She exhibits tenderness and bony tenderness. She exhibits no swelling, no edema and no spasm.  TTP midline sacrum and coccyx with faint bruising noted at this site.  She also has point tenderness at her lower thoracic midline.  No lumbar pain with palpation.  No SI joint tenderness. Pt has FROM of legs at hips,  Passive internal and external ROM of hips pain free.  Neurological: She is alert. She has normal strength. She displays no atrophy and no tremor. No sensory deficit. Gait normal.  Reflex Scores:      Patellar reflexes are 2+ on the right side and 2+ on the left side.      Achilles reflexes are 2+ on the right side and 2+ on the left side. No strength deficit noted in hip and knee flexor and extensor muscle groups.  Ankle flexion and extension intact.  Skin: Skin is warm and dry.  Psychiatric: She has a normal mood and affect.    ED Course   Procedures (including critical care time)  Labs Reviewed - No data to display No results found. 1. Thoracic compression fracture, closed, initial encounter     MDM  xrays reviewed and discussed with patient.  She was prescribed oxycodone for pain relief.  Encouraged ice packs to areas of pain,  Donut pillow for relief of pain when sitting.  She was also referred to Dr. Hilda Lias for further evaluation and management of her thoracic compression fx.     Burgess Amor, PA-C 04/21/13 2255

## 2013-04-22 ENCOUNTER — Emergency Department (HOSPITAL_COMMUNITY)
Admission: EM | Admit: 2013-04-22 | Discharge: 2013-04-22 | Disposition: A | Discharge status: Home / Self Care | Payer: Self-pay | Attending: Emergency Medicine | Admitting: Emergency Medicine

## 2013-04-22 ENCOUNTER — Encounter (HOSPITAL_COMMUNITY): Payer: Self-pay | Admitting: *Deleted

## 2013-04-22 ENCOUNTER — Ambulatory Visit: Payer: Self-pay | Admitting: Family Medicine

## 2013-04-22 DIAGNOSIS — Z794 Long term (current) use of insulin: Secondary | ICD-10-CM | POA: Insufficient documentation

## 2013-04-22 DIAGNOSIS — R000 Tachycardia, unspecified: Secondary | ICD-10-CM | POA: Insufficient documentation

## 2013-04-22 DIAGNOSIS — M546 Pain in thoracic spine: Secondary | ICD-10-CM | POA: Insufficient documentation

## 2013-04-22 DIAGNOSIS — Z8719 Personal history of other diseases of the digestive system: Secondary | ICD-10-CM | POA: Insufficient documentation

## 2013-04-22 DIAGNOSIS — Z7982 Long term (current) use of aspirin: Secondary | ICD-10-CM | POA: Insufficient documentation

## 2013-04-22 DIAGNOSIS — I10 Essential (primary) hypertension: Secondary | ICD-10-CM | POA: Insufficient documentation

## 2013-04-22 DIAGNOSIS — Z79899 Other long term (current) drug therapy: Secondary | ICD-10-CM | POA: Insufficient documentation

## 2013-04-22 DIAGNOSIS — Z8673 Personal history of transient ischemic attack (TIA), and cerebral infarction without residual deficits: Secondary | ICD-10-CM | POA: Insufficient documentation

## 2013-04-22 DIAGNOSIS — E119 Type 2 diabetes mellitus without complications: Secondary | ICD-10-CM | POA: Insufficient documentation

## 2013-04-22 DIAGNOSIS — Z87828 Personal history of other (healed) physical injury and trauma: Secondary | ICD-10-CM | POA: Insufficient documentation

## 2013-04-22 DIAGNOSIS — F17200 Nicotine dependence, unspecified, uncomplicated: Secondary | ICD-10-CM | POA: Insufficient documentation

## 2013-04-22 DIAGNOSIS — Z8659 Personal history of other mental and behavioral disorders: Secondary | ICD-10-CM | POA: Insufficient documentation

## 2013-04-22 DIAGNOSIS — Z862 Personal history of diseases of the blood and blood-forming organs and certain disorders involving the immune mechanism: Secondary | ICD-10-CM | POA: Insufficient documentation

## 2013-04-22 DIAGNOSIS — I252 Old myocardial infarction: Secondary | ICD-10-CM | POA: Insufficient documentation

## 2013-04-22 DIAGNOSIS — Z8639 Personal history of other endocrine, nutritional and metabolic disease: Secondary | ICD-10-CM | POA: Insufficient documentation

## 2013-04-22 DIAGNOSIS — R011 Cardiac murmur, unspecified: Secondary | ICD-10-CM | POA: Insufficient documentation

## 2013-04-22 MED ORDER — PREDNISONE 10 MG PO TABS: 20.0000 mg | ORAL_TABLET | Freq: Two times a day (BID) | ORAL | Status: DC

## 2013-04-22 MED ORDER — OXYCODONE-ACETAMINOPHEN 5-325 MG PO TABS: 2.0000 | ORAL_TABLET | ORAL | Status: DC | PRN

## 2013-04-22 MED ORDER — CYCLOBENZAPRINE HCL 5 MG PO TABS: 5.0000 mg | ORAL_TABLET | Freq: Three times a day (TID) | ORAL | Status: DC | PRN

## 2013-04-22 NOTE — ED Provider Notes (Signed)
Medical screening examination/treatment/procedure(s) were performed by non-physician practitioner and as supervising physician I was immediately available for consultation/collaboration.  Donnetta Hutching, MD 04/22/13 [phone]

## 2013-04-22 NOTE — ED Notes (Signed)
Back pain s/p fall. Seen here for same. And had x-rays done, Was to f/u with Dr Hilda Lias, but did not have the  Money.

## 2013-04-22 NOTE — ED Provider Notes (Signed)
CSN: [id number]     Arrival date & time 04/22/13  1359 History     First MD Initiated Contact with Patient 04/22/13 1411     Chief Complaint  Patient presents with  . Back Pain   (Consider location/radiation/quality/duration/timing/severity/associated sxs/prior Treatment) Patient is a 52 y.o. female presenting with back pain. The history is provided by the patient.  Back Pain Associated symptoms: no fever    Patient states she was here 4 days ago after a fall and has an injury to T12. She has taken all her Percocet and can not get an appointment with Dr. Hilda Lias because she does not have the money. She complains of severe pain in her mid back. Pain increases with deep breath and movement.   Past Medical History  Diagnosis Date  . Depression   . Diabetes mellitus   . GERD (gastroesophageal reflux disease)   . Hyperlipidemia   . Hypertension   . Myocardial infarction   . Stroke   . Heart murmur    Past Surgical History  Procedure Laterality Date  . Endarterectomy  03/02/11  . Laparoscopic cholecystectomy    . Abdominal hysterectomy    . Coronary artery bypass graft  02/09/11    repeat in 2012   Family History  Problem Relation Age of Onset  . Heart disease Mother   . Hypertension Mother   . Hyperlipidemia Mother   . Heart disease Father   . Hypertension Father    History  Substance Use Topics  . Smoking status: Current Every Day Smoker -- 0.30 packs/day for 40 years    Types: Cigarettes  . Smokeless tobacco: Never Used  . Alcohol Use: No   OB History   Grav Para Term Preterm Abortions TAB SAB Ect Mult Living   2 2 2       2      Review of Systems  Constitutional: Negative for fever and chills.  HENT: Negative for neck pain.   Respiratory: Negative for shortness of breath.   Gastrointestinal: Negative for nausea and vomiting.  Musculoskeletal: Positive for back pain.  Skin: Negative for wound.  Psychiatric/Behavioral: The patient is not nervous/anxious.      Allergies  Amoxicillin-pot clavulanate and Iohexol  Home Medications   Current Outpatient Rx  Name  Route  Sig  Dispense  Refill  . aspirin 81 MG EC tablet   Oral   Take 81 mg by mouth daily.           . clopidogrel (PLAVIX) 75 MG tablet   Oral   Take 75 mg by mouth daily.         . insulin aspart (NOVOLOG FLEXPEN) 100 UNIT/ML injection   Subcutaneous   Inject 10 Units into the skin 3 (three) times daily before meals. Please give pen if covered, as well as 3 month supply.   1 vial   6   . insulin glargine (LANTUS SOLOSTAR) 100 UNIT/ML injection   Subcutaneous   Inject 75 Units into the skin 2 (two) times daily. Please give 3 month supply.   1 pen   6   . Insulin Syringe-Needle U-100 31G X 5/16" 1 ML MISC   Does not apply   1 each by Does not apply route as directed.   100 each   6   . lisinopril (PRINIVIL,ZESTRIL) 10 MG tablet   Oral   Take 2 tablets (20 mg total) by mouth daily.   180 tablet   1   . Lite  Touch Lancets MISC      Use 1 unit as directed twice a day          . metoprolol (LOPRESSOR) 100 MG tablet   Oral   Take 100 mg by mouth 2 (two) times daily. Per Dr. Allyson Sabal         . oxyCODONE-acetaminophen (PERCOCET/ROXICET) 5-325 MG per tablet   Oral   Take 1 tablet by mouth every 4 (four) hours as needed for pain.   30 tablet   0    BP 137/92  Pulse 107  Temp(Src) 98 F (36.7 C) (Oral)  Resp 32  Ht 5\' 3"  (1.6 m)  Wt 135 lb (61.236 kg)  BMI 23.92 kg/m2  SpO2 96% Physical Exam  Nursing note and vitals reviewed. Constitutional: She is oriented to person, place, and time. She appears well-developed and well-nourished. No distress.  Eyes: EOM are normal.  Neck: Neck supple.  Cardiovascular: Tachycardia present.   Pulmonary/Chest: Effort normal.  Musculoskeletal:       Thoracic back: She exhibits decreased range of motion, tenderness, bony tenderness and spasm.  Pedal pulses equal bilateral, adequate circulation.  Neurological: She  is alert and oriented to person, place, and time. No cranial nerve deficit.  Skin: Skin is warm and dry.  Psychiatric: She has a normal mood and affect. Her behavior is normal.    ED Course   Procedures   MDM  52 y.o. female with back pain here for pain management until she can get in to see the orthopedic doctor.    Medication List    TAKE these medications       cyclobenzaprine 5 MG tablet  Commonly known as:  FLEXERIL  Take 1 tablet (5 mg total) by mouth 3 (three) times daily as needed for muscle spasms.     oxyCODONE-acetaminophen 5-325 MG per tablet  Commonly known as:  PERCOCET/ROXICET  Take 2 tablets by mouth every 4 (four) hours as needed for pain.     predniSONE 10 MG tablet  Commonly known as:  DELTASONE  Take 2 tablets (20 mg total) by mouth 2 (two) times daily.      ASK your doctor about these medications       aspirin 81 MG EC tablet  Take 81 mg by mouth daily.     clopidogrel 75 MG tablet  Commonly known as:  PLAVIX  Take 75 mg by mouth daily.     insulin aspart 100 UNIT/ML injection  Commonly known as:  NOVOLOG FLEXPEN  Inject 10 Units into the skin 3 (three) times daily before meals. Please give pen if covered, as well as 3 month supply.     insulin glargine 100 UNIT/ML injection  Commonly known as:  LANTUS SOLOSTAR  Inject 75 Units into the skin 2 (two) times daily. Please give 3 month supply.     Insulin Syringe-Needle U-100 31G X 5/16" 1 ML Misc  1 each by Does not apply route as directed.     lisinopril 10 MG tablet  Commonly known as:  PRINIVIL,ZESTRIL  Take 2 tablets (20 mg total) by mouth daily.     Lite Touch Lancets Misc  Use 1 unit as directed twice a day     metoprolol 100 MG tablet  Commonly known as:  LOPRESSOR  Take 100 mg by mouth 2 (two) times daily. Per Dr. Joya Salm Orlene Och, NP 04/22/13 1436

## 2013-04-22 NOTE — ED Notes (Signed)
Seen here for fall x 4 days ago.  States was dx with compression fx.  C/o increased pain since and unable to see ortho due to financial reasons.

## 2013-04-22 NOTE — ED Provider Notes (Signed)
Medical screening examination/treatment/procedure(s) were performed by non-physician practitioner and as supervising physician I was immediately available for consultation/collaboration.   Trayshawn Durkin, MD 04/22/13 2224 

## 2013-04-23 ENCOUNTER — Encounter: Payer: Self-pay | Admitting: Family Medicine

## 2013-04-23 ENCOUNTER — Ambulatory Visit (INDEPENDENT_AMBULATORY_CARE_PROVIDER_SITE_OTHER): Payer: Self-pay | Admitting: Family Medicine

## 2013-04-23 VITALS — BP 166/98 | HR 109 | Temp 98.4°F | Wt 148.0 lb

## 2013-04-23 DIAGNOSIS — I10 Essential (primary) hypertension: Secondary | ICD-10-CM

## 2013-04-23 DIAGNOSIS — M549 Dorsalgia, unspecified: Secondary | ICD-10-CM | POA: Insufficient documentation

## 2013-04-23 MED ORDER — LORAZEPAM 0.5 MG PO TABS: 0.5000 mg | ORAL_TABLET | Freq: Two times a day (BID) | ORAL | Status: DC | PRN

## 2013-04-23 MED ORDER — OXYCODONE-ACETAMINOPHEN 5-325 MG PO TABS: 2.0000 | ORAL_TABLET | ORAL | Status: DC | PRN

## 2013-04-23 MED ORDER — CYCLOBENZAPRINE HCL 5 MG PO TABS: 10.0000 mg | ORAL_TABLET | Freq: Three times a day (TID) | ORAL | Status: DC | PRN

## 2013-04-23 MED ORDER — KETOROLAC TROMETHAMINE 60 MG/2ML IM SOLN
60.0000 mg | Freq: Once | INTRAMUSCULAR | Status: AC
Start: 2013-04-23 — End: 2013-04-23
  Administered 2013-04-23: 60 mg via INTRAMUSCULAR

## 2013-04-23 NOTE — Assessment & Plan Note (Signed)
Elevated today Likely in pain F/u in 2 wks

## 2013-04-23 NOTE — Patient Instructions (Addendum)
Your back pain will get better over the next 1-2 weeks Please stay active and stretch your back daily Continue with warm showers, massage, and heat packs Please increase your Flexeril to 10mg  I have also written you for Ativan and percocet. There will be no refills on these medications Please do not take the ativan, percocet and flexeril all at the same time.  Come back and see me in 2-3 weeks.

## 2013-04-23 NOTE — Progress Notes (Signed)
[name] is a 52 y.o. female who presents to [facility] today for back pain.   Persistant lower back pain:  Fell on ramp 7 days ago. Went to ED on 8/18 and found to have compression fracture. Gave percocet and donut cushion. H/o previous tail bone fracture. Went back to ED for pain control. Here today for pain Taking 2 percocet Q4 hrs. Using the donut cushion, heating pad w/ mild benefit. Also taking prednisone. Flexeril w/o benefit. 800mg  ibuprofen w/o benfit.  Pain is non radiating and is located in mid "butt" to lower back region. BMs QOD. Using stool softener.   The following portions of the patient's history were reviewed and updated as appropriate: allergies, current medications, past medical history, family and social history, and problem list.  Patient is a nonsmoker.  Past Medical History  Diagnosis Date  . Depression   . Diabetes mellitus   . GERD (gastroesophageal reflux disease)   . Hyperlipidemia   . Hypertension   . Myocardial infarction   . Stroke   . Heart murmur     ROS as above otherwise neg.    Medications reviewed. Current Outpatient Prescriptions  Medication Sig Dispense Refill  . aspirin 81 MG EC tablet Take 81 mg by mouth daily.        . clopidogrel (PLAVIX) 75 MG tablet Take 75 mg by mouth daily.      . cyclobenzaprine (FLEXERIL) 5 MG tablet Take 1 tablet (5 mg total) by mouth 3 (three) times daily as needed for muscle spasms.  30 tablet  0  . insulin aspart (NOVOLOG FLEXPEN) 100 UNIT/ML injection Inject 10 Units into the skin 3 (three) times daily before meals. Please give pen if covered, as well as 3 month supply.  1 vial  6  . insulin glargine (LANTUS SOLOSTAR) 100 UNIT/ML injection Inject 75 Units into the skin 2 (two) times daily. Please give 3 month supply.  1 pen  6  . Insulin Syringe-Needle U-100 31G X 5/16" 1 ML MISC 1 each by Does not apply route as directed.  100 each  6  . lisinopril (PRINIVIL,ZESTRIL) 10 MG tablet Take 2 tablets (20 mg total) by  mouth daily.  180 tablet  1  . Lite Touch Lancets MISC Use 1 unit as directed twice a day       . metoprolol (LOPRESSOR) 100 MG tablet Take 100 mg by mouth 2 (two) times daily. Per Dr. Allyson Sabal      . oxyCODONE-acetaminophen (PERCOCET/ROXICET) 5-325 MG per tablet Take 2 tablets by mouth every 4 (four) hours as needed for pain.  15 tablet  0  . predniSONE (DELTASONE) 10 MG tablet Take 2 tablets (20 mg total) by mouth 2 (two) times daily.  20 tablet  0   No current facility-administered medications for this visit.    Exam:  BP 166/98  Pulse 109  Temp(Src) 98.4 F (36.9 C) (Oral)  Wt 148 lb (67.132 kg)  BMI 26.22 kg/m2  SpO2 98% Gen: Well NAD HEENT: EOMI,  MMM MSK: tight perispian muscles of the lower back to the upper buttocks. No bony abnormailty  No results found for this or any previous visit (from the past 72 hour(s)).

## 2013-04-23 NOTE — Assessment & Plan Note (Addendum)
Reviwed previous ED charts and full imaging studies.  No evidence of acute fracture but anterior wedging of T11.  Increase flexeril to 10mg  Continue percocet (no refills) Toradol in office.  Ativan once (no refills).

## 2013-04-23 NOTE — Addendum Note (Signed)
Addended by: Henri Medal on: 04/23/2013 04:15 PM   Modules accepted: Orders

## 2013-04-25 ENCOUNTER — Telehealth: Payer: Self-pay | Admitting: Family Medicine

## 2013-04-25 NOTE — Telephone Encounter (Signed)
Pt still wants medications refill because she can not drive 16 hours without having medication. She would like Dr. Konrad Dolores to call her personally. JW

## 2013-04-25 NOTE — Telephone Encounter (Signed)
Will forward to Dr Merrell 

## 2013-04-25 NOTE — Telephone Encounter (Signed)
Pt already w/ ativan, flexeril and percocet. There is no other therapy. Pt will need someone else to drive as these meidcations are dangerous. Pt should not need further therapy for basic MSK pains from fall

## 2013-04-25 NOTE — Telephone Encounter (Signed)
Pt called and explained that she should not need other meds - recommended OTC motrin, aleve, tylenol, aspercreame, heating pad. :"I have tried all them and they don't work - I guess I will go to the damn hospital they will give it to me." Pt hung up. Wyatt Haste, RN-BSN

## 2013-04-25 NOTE — Telephone Encounter (Signed)
Patient states that her uncle passed away and she has to drive to [place] for the funeral. She is wanting to know if Dr. Konrad Dolores will prescribe her something for her back pain since she is having to be in a car for that long of a ride. She will be gone for 5 days. Pls call patient.

## 2013-04-29 ENCOUNTER — Telehealth: Payer: Self-pay | Admitting: Family Medicine

## 2013-04-29 DIAGNOSIS — M549 Dorsalgia, unspecified: Secondary | ICD-10-CM

## 2013-04-29 MED ORDER — MELOXICAM 7.5 MG PO TABS: 7.5000 mg | ORAL_TABLET | Freq: Every day | ORAL | Status: DC

## 2013-04-29 MED ORDER — TRAMADOL HCL 50 MG PO TABS: 50.0000 mg | ORAL_TABLET | Freq: Three times a day (TID) | ORAL | Status: DC | PRN

## 2013-04-29 NOTE — Telephone Encounter (Signed)
Spoke to pt concerning persistent back pain. Pt used last of percocet on Sunday Back pain made worse by prolonged car ride. Pt aware asking for hydrocodone to be sent in  Pt reminded that Percocet and Ativan not to be refilled Pt needs to rest and do exercises Will send in one time tramadol along w/ meloxicam Discussed bleed risk w/ pharmacist Pt aware of increased risk due to ASA/Plavix therapy for CAD being combined w/ Meloxicam but willing to try as this may help get her over her current pain/spasm/inflammatory state.  Pt also to add Prilosec to her daily regimen Pt to also use tylenol 1000mg  Q6 for the next 1-2 wks Pt expressed understanding and will go to pharmacy to pick up  Shelly Flatten, MD Family Medicine PGY-3 04/29/2013, 4:53 PM

## 2013-04-29 NOTE — Assessment & Plan Note (Signed)
Will not refill Ativan or Percocet Pt to rest adn start tylenol up to 1000mg  Q6 for 1-2 wks Additionally will send in Tramadol 50mg  1-2 tabs Q8 Will also try Mobic for 1 wk. Spoke to pharmacist and pt who is aware of increased bleed risk but amensable to trying.  Pt will also try home PPI during this time.

## 2013-04-29 NOTE — Telephone Encounter (Signed)
Will forward to MD. [name],CMA  

## 2013-04-29 NOTE — Telephone Encounter (Signed)
Patient is calling because she has taken all of the medication that he advised and her back pain is still unbearable.  She had to go to [place] for a death in her family and she is sure that the ride in the car didn't help matter at all.  She can't sleep because the pain is so bad.  What can she do now?  She doesn't want anything to knock her out, just something to ease the pain because she can't make it like this until 9/12 which is when her next appt is.

## 2013-05-24 ENCOUNTER — Ambulatory Visit: Payer: Self-pay | Admitting: Family Medicine

## 2014-07-14 ENCOUNTER — Encounter: Payer: Self-pay | Admitting: Family Medicine

## 2014-11-04 ENCOUNTER — Observation Stay (HOSPITAL_COMMUNITY): Payer: Self-pay

## 2014-11-04 ENCOUNTER — Encounter (HOSPITAL_COMMUNITY): Payer: Self-pay | Admitting: *Deleted

## 2014-11-04 ENCOUNTER — Emergency Department (HOSPITAL_COMMUNITY): Payer: Self-pay

## 2014-11-04 ENCOUNTER — Inpatient Hospital Stay (HOSPITAL_COMMUNITY)
Admission: EM | Admit: 2014-11-04 | Discharge: 2014-11-06 | DRG: 065 | Disposition: A | Discharge status: Home / Self Care | Payer: Self-pay | Attending: Internal Medicine | Admitting: Internal Medicine

## 2014-11-04 DIAGNOSIS — I252 Old myocardial infarction: Secondary | ICD-10-CM

## 2014-11-04 DIAGNOSIS — Z95820 Peripheral vascular angioplasty status with implants and grafts: Secondary | ICD-10-CM

## 2014-11-04 DIAGNOSIS — Z880 Allergy status to penicillin: Secondary | ICD-10-CM

## 2014-11-04 DIAGNOSIS — I10 Essential (primary) hypertension: Secondary | ICD-10-CM

## 2014-11-04 DIAGNOSIS — G8191 Hemiplegia, unspecified affecting right dominant side: Secondary | ICD-10-CM | POA: Diagnosis present

## 2014-11-04 DIAGNOSIS — R739 Hyperglycemia, unspecified: Secondary | ICD-10-CM

## 2014-11-04 DIAGNOSIS — M549 Dorsalgia, unspecified: Secondary | ICD-10-CM | POA: Diagnosis present

## 2014-11-04 DIAGNOSIS — K219 Gastro-esophageal reflux disease without esophagitis: Secondary | ICD-10-CM | POA: Diagnosis present

## 2014-11-04 DIAGNOSIS — Z951 Presence of aortocoronary bypass graft: Secondary | ICD-10-CM

## 2014-11-04 DIAGNOSIS — E1142 Type 2 diabetes mellitus with diabetic polyneuropathy: Secondary | ICD-10-CM | POA: Diagnosis present

## 2014-11-04 DIAGNOSIS — I739 Peripheral vascular disease, unspecified: Secondary | ICD-10-CM | POA: Diagnosis present

## 2014-11-04 DIAGNOSIS — F329 Major depressive disorder, single episode, unspecified: Secondary | ICD-10-CM | POA: Diagnosis present

## 2014-11-04 DIAGNOSIS — F1410 Cocaine abuse, uncomplicated: Secondary | ICD-10-CM | POA: Diagnosis present

## 2014-11-04 DIAGNOSIS — Z91041 Radiographic dye allergy status: Secondary | ICD-10-CM

## 2014-11-04 DIAGNOSIS — G8929 Other chronic pain: Secondary | ICD-10-CM | POA: Diagnosis present

## 2014-11-04 DIAGNOSIS — Z9049 Acquired absence of other specified parts of digestive tract: Secondary | ICD-10-CM | POA: Diagnosis present

## 2014-11-04 DIAGNOSIS — Z8673 Personal history of transient ischemic attack (TIA), and cerebral infarction without residual deficits: Secondary | ICD-10-CM

## 2014-11-04 DIAGNOSIS — I6523 Occlusion and stenosis of bilateral carotid arteries: Secondary | ICD-10-CM | POA: Diagnosis present

## 2014-11-04 DIAGNOSIS — Z90710 Acquired absence of both cervix and uterus: Secondary | ICD-10-CM

## 2014-11-04 DIAGNOSIS — E785 Hyperlipidemia, unspecified: Secondary | ICD-10-CM | POA: Diagnosis present

## 2014-11-04 DIAGNOSIS — Z23 Encounter for immunization: Secondary | ICD-10-CM

## 2014-11-04 DIAGNOSIS — Z6824 Body mass index (BMI) 24.0-24.9, adult: Secondary | ICD-10-CM

## 2014-11-04 DIAGNOSIS — E1165 Type 2 diabetes mellitus with hyperglycemia: Secondary | ICD-10-CM | POA: Diagnosis present

## 2014-11-04 DIAGNOSIS — E669 Obesity, unspecified: Secondary | ICD-10-CM | POA: Diagnosis present

## 2014-11-04 DIAGNOSIS — I638 Other cerebral infarction: Principal | ICD-10-CM | POA: Diagnosis present

## 2014-11-04 DIAGNOSIS — G4700 Insomnia, unspecified: Secondary | ICD-10-CM | POA: Diagnosis present

## 2014-11-04 DIAGNOSIS — I639 Cerebral infarction, unspecified: Secondary | ICD-10-CM | POA: Diagnosis present

## 2014-11-04 DIAGNOSIS — F17210 Nicotine dependence, cigarettes, uncomplicated: Secondary | ICD-10-CM | POA: Diagnosis present

## 2014-11-04 DIAGNOSIS — Z8249 Family history of ischemic heart disease and other diseases of the circulatory system: Secondary | ICD-10-CM

## 2014-11-04 DIAGNOSIS — Z79899 Other long term (current) drug therapy: Secondary | ICD-10-CM

## 2014-11-04 DIAGNOSIS — F17200 Nicotine dependence, unspecified, uncomplicated: Secondary | ICD-10-CM | POA: Diagnosis present

## 2014-11-04 DIAGNOSIS — I2510 Atherosclerotic heart disease of native coronary artery without angina pectoris: Secondary | ICD-10-CM | POA: Diagnosis present

## 2014-11-04 DIAGNOSIS — E1151 Type 2 diabetes mellitus with diabetic peripheral angiopathy without gangrene: Secondary | ICD-10-CM | POA: Diagnosis present

## 2014-11-04 DIAGNOSIS — Z794 Long term (current) use of insulin: Secondary | ICD-10-CM

## 2014-11-04 DIAGNOSIS — R011 Cardiac murmur, unspecified: Secondary | ICD-10-CM | POA: Diagnosis present

## 2014-11-04 LAB — I-STAT CHEM 8, ED
BUN: 20 mg/dL (ref 6–23)
CALCIUM ION: 1.15 mmol/L (ref 1.12–1.23)
Chloride: 101 mmol/L (ref 96–112)
Creatinine, Ser: 1 mg/dL (ref 0.50–1.10)
Glucose, Bld: 442 mg/dL — ABNORMAL HIGH (ref 70–99)
HEMATOCRIT: 46 % (ref 36.0–46.0)
Hemoglobin: 15.6 g/dL — ABNORMAL HIGH (ref 12.0–15.0)
Potassium: 4.2 mmol/L (ref 3.5–5.1)
Sodium: 135 mmol/L (ref 135–145)
TCO2: 20 mmol/L (ref 0–100)

## 2014-11-04 LAB — DIFFERENTIAL
BASOS ABS: 0 10*3/uL (ref 0.0–0.1)
Basophils Relative: 0 % (ref 0–1)
Eosinophils Absolute: 0.1 10*3/uL (ref 0.0–0.7)
Eosinophils Relative: 1 % (ref 0–5)
LYMPHS ABS: 1.9 10*3/uL (ref 0.7–4.0)
LYMPHS PCT: 22 % (ref 12–46)
Monocytes Absolute: 0.3 10*3/uL (ref 0.1–1.0)
Monocytes Relative: 4 % (ref 3–12)
NEUTROS ABS: 6.2 10*3/uL (ref 1.7–7.7)
NEUTROS PCT: 73 % (ref 43–77)

## 2014-11-04 LAB — COMPREHENSIVE METABOLIC PANEL
ALBUMIN: 3.9 g/dL (ref 3.5–5.2)
ALT: 13 U/L (ref 0–35)
AST: 15 U/L (ref 0–37)
Alkaline Phosphatase: 88 U/L (ref 39–117)
Anion gap: 5 (ref 5–15)
BUN: 20 mg/dL (ref 6–23)
CALCIUM: 8.8 mg/dL (ref 8.4–10.5)
CO2: 23 mmol/L (ref 19–32)
Chloride: 105 mmol/L (ref 96–112)
Creatinine, Ser: 1.06 mg/dL (ref 0.50–1.10)
GFR calc Af Amer: 68 mL/min — ABNORMAL LOW (ref >90)
GFR calc non Af Amer: 59 mL/min — ABNORMAL LOW (ref >90)
Glucose, Bld: 431 mg/dL — ABNORMAL HIGH (ref 70–99)
Potassium: 4.2 mmol/L (ref 3.5–5.1)
Sodium: 133 mmol/L — ABNORMAL LOW (ref 135–145)
Total Bilirubin: 0.3 mg/dL (ref 0.3–1.2)
Total Protein: 7.1 g/dL (ref 6.0–8.3)

## 2014-11-04 LAB — CBC
HCT: 41.9 % (ref 36.0–46.0)
Hemoglobin: 14.1 g/dL (ref 12.0–15.0)
MCH: 29 pg (ref 26.0–34.0)
MCHC: 33.7 g/dL (ref 30.0–36.0)
MCV: 86 fL (ref 78.0–100.0)
Platelets: 244 10*3/uL (ref 150–400)
RBC: 4.87 MIL/uL (ref 3.87–5.11)
RDW: 12.8 % (ref 11.5–15.5)
WBC: 8.5 10*3/uL (ref 4.0–10.5)

## 2014-11-04 LAB — APTT: APTT: 24 s (ref 24–37)

## 2014-11-04 LAB — PROTIME-INR
INR: 0.89 (ref 0.00–1.49)
PROTHROMBIN TIME: 12.1 s (ref 11.6–15.2)

## 2014-11-04 LAB — CBG MONITORING, ED: Glucose-Capillary: 386 mg/dL — ABNORMAL HIGH (ref 70–99)

## 2014-11-04 LAB — GLUCOSE, CAPILLARY: Glucose-Capillary: 344 mg/dL — ABNORMAL HIGH (ref 70–99)

## 2014-11-04 LAB — ETHANOL: Alcohol, Ethyl (B): 5 mg/dL (ref 0–9)

## 2014-11-04 MED ORDER — PNEUMOCOCCAL VAC POLYVALENT 25 MCG/0.5ML IJ INJ
0.5000 mL | INJECTION | INTRAMUSCULAR | Status: AC
  Administered 2014-11-05: 0.5 mL via INTRAMUSCULAR
  Filled 2014-11-04: qty 0.5

## 2014-11-04 MED ORDER — INSULIN GLARGINE 100 UNIT/ML ~~LOC~~ SOLN: 55.0000 [IU] | Freq: Two times a day (BID) | SUBCUTANEOUS | Status: DC

## 2014-11-04 MED ORDER — ASPIRIN 300 MG RE SUPP
300.0000 mg | Freq: Every day | RECTAL | Status: DC
  Filled 2014-11-04 (×3): qty 1

## 2014-11-04 MED ORDER — STROKE: EARLY STAGES OF RECOVERY BOOK
Freq: Once | Status: AC
  Administered 2014-11-04: 23:00:00
  Filled 2014-11-04 (×2): qty 1

## 2014-11-04 MED ORDER — ENOXAPARIN SODIUM 40 MG/0.4ML ~~LOC~~ SOLN
40.0000 mg | SUBCUTANEOUS | Status: DC
  Administered 2014-11-05 (×2): 40 mg via SUBCUTANEOUS
  Filled 2014-11-04 (×2): qty 0.4

## 2014-11-04 MED ORDER — ASPIRIN 325 MG PO TABS
325.0000 mg | ORAL_TABLET | Freq: Every day | ORAL | Status: DC
  Administered 2014-11-05 – 2014-11-06 (×2): 325 mg via ORAL
  Filled 2014-11-04 (×2): qty 1

## 2014-11-04 MED ORDER — ACETAMINOPHEN 325 MG PO TABS
650.0000 mg | ORAL_TABLET | Freq: Once | ORAL | Status: DC
  Filled 2014-11-04: qty 2

## 2014-11-04 MED ORDER — INSULIN ASPART 100 UNIT/ML ~~LOC~~ SOLN
8.0000 [IU] | Freq: Once | SUBCUTANEOUS | Status: AC
Start: 2014-11-04 — End: 2014-11-05
  Administered 2014-11-05: 8 [IU] via SUBCUTANEOUS

## 2014-11-04 MED ORDER — TRAMADOL HCL 50 MG PO TABS
50.0000 mg | ORAL_TABLET | Freq: Once | ORAL | Status: DC
  Filled 2014-11-04: qty 1

## 2014-11-04 MED ORDER — INFLUENZA VAC SPLIT QUAD 0.5 ML IM SUSY
0.5000 mL | PREFILLED_SYRINGE | INTRAMUSCULAR | Status: AC
  Administered 2014-11-05: 0.5 mL via INTRAMUSCULAR
  Filled 2014-11-04: qty 0.5

## 2014-11-04 MED ORDER — INSULIN ASPART 100 UNIT/ML ~~LOC~~ SOLN
0.0000 [IU] | SUBCUTANEOUS | Status: DC
  Administered 2014-11-05: 7 [IU] via SUBCUTANEOUS
  Administered 2014-11-05 (×2): 1 [IU] via SUBCUTANEOUS
  Administered 2014-11-05: 9 [IU] via SUBCUTANEOUS
  Administered 2014-11-06: 2 [IU] via SUBCUTANEOUS
  Administered 2014-11-06: 5 [IU] via SUBCUTANEOUS

## 2014-11-04 MED ORDER — INSULIN GLARGINE 100 UNIT/ML ~~LOC~~ SOLN
30.0000 [IU] | Freq: Two times a day (BID) | SUBCUTANEOUS | Status: DC
  Administered 2014-11-05 – 2014-11-06 (×4): 30 [IU] via SUBCUTANEOUS
  Filled 2014-11-04 (×7): qty 0.3

## 2014-11-04 NOTE — ED Provider Notes (Signed)
CSN: [id number]     Arrival date & time 11/04/14  2003 History   First MD Initiated Contact with Patient 11/04/14 2029     JX:[id number]:Possible stroke  HPI HPI Comments: [name] is a 54 y.o. female with history of previous CVA who presents to the Emergency Department complaining of rt sided numbness for the past 5 days. She mentions that the numbness has worsened since 5 days ago. Pt also complains of headache, tongue numbness, weakness, nausea, vomiting, and diarrhea.. She states that she has been walking with a limp and that her speech is abnormal. She denies fever, chills. Pt claims that during her previous CVA, she woke up at the hospital and was told that she had had a stroke. Denies any past numbness or weakness with previous CVA Past Medical History  Diagnosis Date  . Depression   . Diabetes mellitus   . GERD (gastroesophageal reflux disease)   . Hyperlipidemia   . Hypertension   . Myocardial infarction   . Stroke   . Heart murmur    Past Surgical History  Procedure Laterality Date  . Endarterectomy  03/02/11  . Laparoscopic cholecystectomy    . Abdominal hysterectomy    . Coronary artery bypass graft  02/09/11    repeat in 2012  . Cardiac surgery     Family History  Problem Relation Age of Onset  . Heart disease Mother   . Hypertension Mother   . Hyperlipidemia Mother   . Heart disease Father   . Hypertension Father    History  Substance Use Topics  . Smoking status: Current Every Day Smoker -- 0.30 packs/day for 40 years    Types: Cigarettes  . Smokeless tobacco: Never Used  . Alcohol Use: No   OB History    Gravida Para Term Preterm AB TAB SAB Ectopic Multiple Living   2 2 2       2      Review of Systems  All other systems reviewed and are negative.     Allergies  Amoxicillin-pot clavulanate and Iohexol  Home Medications   Prior to Admission medications   Medication Sig Start Date End Date Taking? Authorizing Provider  cyclobenzaprine (FLEXERIL) 5 MG  tablet Take 2 tablets (10 mg total) by mouth 3 (three) times daily as needed for muscle spasms. Patient not taking: Reported on 11/04/2014 04/23/13   Ozella Rocksavid J Merrell, MD  insulin aspart (NOVOLOG FLEXPEN) 100 UNIT/ML injection Inject 10 Units into the skin 3 (three) times daily before meals. Please give pen if covered, as well as 3 month supply. Patient not taking: Reported on 11/04/2014 11/04/11   Judi SaaZachary M Smith, DO  insulin glargine (LANTUS SOLOSTAR) 100 UNIT/ML injection Inject 75 Units into the skin 2 (two) times daily. Please give 3 month supply. Patient not taking: Reported on 11/04/2014 11/04/11   Judi SaaZachary M Smith, DO  Insulin Syringe-Needle U-100 31G X 5/16" 1 ML MISC 1 each by Does not apply route as directed. Patient not taking: Reported on 11/04/2014 07/24/12   Ozella Rocksavid J Merrell, MD  lisinopril (PRINIVIL,ZESTRIL) 10 MG tablet Take 2 tablets (20 mg total) by mouth daily. Patient not taking: Reported on 11/04/2014 11/04/11   Judi SaaZachary M Smith, DO  LORazepam (ATIVAN) 0.5 MG tablet Take 1 tablet (0.5 mg total) by mouth 2 (two) times daily as needed for anxiety. Patient not taking: Reported on 11/04/2014 04/23/13   Ozella Rocksavid J Merrell, MD  meloxicam (MOBIC) 7.5 MG tablet Take 1 tablet (7.5 mg total) by  mouth daily. Patient not taking: Reported on 11/04/2014 04/29/13   Ozella Rocks, MD  oxyCODONE-acetaminophen (PERCOCET/ROXICET) 5-325 MG per tablet Take 2 tablets by mouth every 4 (four) hours as needed for pain. Patient not taking: Reported on 11/04/2014 04/23/13   Ozella Rocks, MD  predniSONE (DELTASONE) 10 MG tablet Take 2 tablets (20 mg total) by mouth 2 (two) times daily. Patient not taking: Reported on 11/04/2014 04/22/13   Janne Napoleon, NP  traMADol (ULTRAM) 50 MG tablet Take 1 tablet (50 mg total) by mouth every 8 (eight) hours as needed for pain. Patient not taking: Reported on 11/04/2014 04/29/13   Ozella Rocks, MD   BP 126/81 mmHg  Pulse 90  Temp(Src) 98.4 F (36.9 C) (Oral)  Resp 20  Ht   (1.651 m)  Wt 145 lb (65.772 kg)  BMI 24.13 kg/m2  SpO2 99% Physical Exam  Constitutional: She is oriented to person, place, and time. She appears well-developed and well-nourished. No distress.  HENT:  Head: Normocephalic and atraumatic.  Right Ear: External ear normal.  Left Ear: External ear normal.  Mouth/Throat: Oropharynx is clear and moist.  Eyes: Conjunctivae are normal. Right eye exhibits no discharge. Left eye exhibits no discharge. No scleral icterus.  Neck: Neck supple. No tracheal deviation present.  Cardiovascular: Normal rate, regular rhythm and intact distal pulses.   Pulmonary/Chest: Effort normal and breath sounds normal. No stridor. No respiratory distress. She has no wheezes. She has no rales.  Abdominal: Soft. Bowel sounds are normal. She exhibits no distension. There is no tenderness. There is no rebound and no guarding.  Musculoskeletal: She exhibits no edema or tenderness.  Neurological: She is alert and oriented to person, place, and time. She has normal strength. No cranial nerve deficit (No facial droop, extraocular movements intact, tongue midline ) or sensory deficit. She exhibits normal muscle tone. She displays no seizure activity. Coordination normal.   pronator drift Right upper extrem, unable to hold both right leg off bed for 5 seconds, decreased  sensation RLE and RUE, no visual field cuts, no left or right sided neglect, normal finger-nose exam  And heel shin, no nystagmus noted  NIHSS = 4  Skin: Skin is warm and dry. No rash noted.  Psychiatric: She has a normal mood and affect.  Nursing note and vitals reviewed.   ED Course  Procedures (including critical care time) Labs Review Labs Reviewed  COMPREHENSIVE METABOLIC PANEL - Abnormal; Notable for the following:    Sodium 133 (*)    Glucose, Bld 431 (*)    GFR calc non Af Amer 59 (*)    GFR calc Af Amer 68 (*)    All other components within normal limits  I-STAT CHEM 8, ED - Abnormal; Notable  for the following:    Glucose, Bld 442 (*)    Hemoglobin 15.6 (*)    All other components within normal limits  ETHANOL  PROTIME-INR  APTT  CBC  DIFFERENTIAL  URINE RAPID DRUG SCREEN (HOSP PERFORMED)  URINALYSIS, ROUTINE W REFLEX MICROSCOPIC  I-STAT TROPOININ, ED  I-STAT TROPOININ, ED    Imaging Review Ct Head Wo Contrast  11/04/2014   CLINICAL DATA:  Headache for 5 days. Right-sided numbness and weakness.  EXAM: CT HEAD WITHOUT CONTRAST  TECHNIQUE: Contiguous axial images were obtained from the base of the skull through the vertex without intravenous contrast.  COMPARISON:  CT scan of Jan 11, 2010.  FINDINGS: Bony calvarium appears intact. Mild chronic ischemic white  matter disease is noted. Old cystic infarction is noted in left basal ganglia. No mass effect or midline shift is noted. Ventricular size is within normal limits. There is no evidence of mass lesion, hemorrhage or acute infarction.  IMPRESSION: Mild chronic ischemic white matter disease. Old lacunar infarction in left basal ganglia. No acute intracranial abnormality seen.   Electronically Signed   By: Lupita Raider, M.D.   On: 11/04/2014 21:49     EKG Interpretation   Date/Time:  Tuesday November 04 2014 20:42:43 EST Ventricular Rate:  95 PR Interval:  162 QRS Duration: 143 QT Interval:  410 QTC Calculation: 515 R Axis:   -179 Text Interpretation:  Sinus rhythm LAE, consider biatrial enlargement RBBB  and LPFB , new since last tracing Baseline wander in lead(s) V2 Confirmed  by Kaleigha Chamberlin  MD-J, Kylen Ismael ([id number]) on 11/04/2014 8:50:53 PM      MDM   Final diagnoses:  Stroke  Hyperglycemia    Pt symptoms are concerning for a stroke.  She is outside of code stroke criteria.  Symptom started several days ago.  Will consult with medical service for admission, further testing.  MRI in the am.    Linwood Dibbles, MD 11/04/14 2231

## 2014-11-04 NOTE — H&P (Addendum)
Hospitalist Admission History and Physical  Patient name: [name] Medical record number: [id number] Date of birth: Mar 09, 1961 Age: 54 y.o. Gender: female  Primary Care Provider: Yolande Jolly, MD  Chief Complaint: CVA  History of Present Illness:This is a 54 y.o. year old female with significant past medical history of IDDM, HTN, tobacco abuse, CAD, CVA  presenting with CVA. Patient reports right-sided weakness over the past 4-5 days. Patient initially thought symptoms were going to self resolve however only persisted. Reports remote history of massive stroke proximal be 7-8 years ago. Denies any lingering deficits from this. Does not take aspirin daily. Still smokes daily. Intermittently compliant with her insulin regimen. Has had some intermittent nausea and abdominal pain. Otherwise asymptomatic. Presents to the ER afebrile, hemodynamically stable. CBC and be made within normal limits. Glucose of 431. Bicarbonate 23. Head CT shows mild chronic ischemic white matter disease and old lacunar infarct in left basal ganglia. No acute abnormality. EKG sinus rhythm with a right bundle branch block  Assessment and Plan: [name] is a 54 y.o. year old female presenting with CVA   Active Problems:   CVA (cerebral infarction)   1- CVA -persistent R sided deficits on exam  -MRI/MRA, 2D ECHO, carotid dopplers, risk stratification labs -full dose ASA -neuro consult in am   2-Hyperglycemia -SSI  -A1C  -cont home long acting insulin at reduced dose   3-HTN -LLN BPs -hold orals overnight -reassess in am   4-tobacco abuse -quit smoking! -counseling  5-CAD  -no active CP  -trop pending -EKG NSR  -tele bed  -risk stratification labs   FEN/GI: NPO pending bedside swallow eval  Prophylaxis: lovenox  Disposition: pending further evaluation  Code Status:Full Code    Patient Active Problem List   Diagnosis Date Noted  . CVA (cerebral infarction) 11/04/2014  . Back  pain 04/23/2013  . Insomnia 05/17/2012  . Lower extremity edema 05/16/2012  . Chronic pain 07/07/2011  . Depression 01/12/2011  . VAGINITIS, ATROPHIC 09/20/2010  . DIABETES MELLITUS, TYPE II, UNCONTROLLED, W/VASCULAR COMPS 04/08/2010  . CAROTID ARTERY STENOSIS, LEFT 06/18/2009  . MASTALGIA 03/11/2009  . 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 NARCOTIC ABUSE 09/30/2008  . Headache 03/24/2008  . OBESITY, UNSPECIFIED 08/02/2007  . TOBACCO ABUSE 07/02/2007  . DIABETIC PERIPHERAL NEUROPATHY 01/24/2007  . HYPERLIPIDEMIA NEC/NOS 11/13/2006  . HYPERTENSION 11/13/2006  . C A D 11/13/2006  . PERIPHERAL VASCULAR DISEASE 11/13/2006   Past Medical History: Past Medical History  Diagnosis Date  . Depression   . Diabetes mellitus   . GERD (gastroesophageal reflux disease)   . Hyperlipidemia   . Hypertension   . Myocardial infarction   . Stroke   . Heart murmur     Past Surgical History: Past Surgical History  Procedure Laterality Date  . Endarterectomy  03/02/11  . Laparoscopic cholecystectomy    . Abdominal hysterectomy    . Coronary artery bypass graft  02/09/11    repeat in 2012  . Cardiac surgery      Social History: History   Social History  . Marital Status: Single    Spouse Name: N/A  . Number of Children: N/A  . Years of Education: N/A   Social History Main Topics  . Smoking status: Current Every Day Smoker -- 0.30 packs/day for 40 years    Types: Cigarettes  . Smokeless tobacco: Never Used  . Alcohol Use: No  . Drug Use: No  . Sexual Activity: Yes    Birth Control/  Protection: Surgical   Other Topics Concern  . None   Social History Narrative    Family History: Family History  Problem Relation Age of Onset  . Heart disease Mother   . Hypertension Mother   . Hyperlipidemia Mother   . Heart disease Father   . Hypertension Father     Allergies: Allergies  Allergen Reactions  . Amoxicillin-Pot Clavulanate Swelling  . Iohexol Hives and Nausea And Vomiting     Current Facility-Administered Medications  Medication Dose Route Frequency Provider Last Rate Last Dose  .  stroke: mapping our early stages of recovery book   Does not apply Once Doree Albee, MD      . acetaminophen (TYLENOL) tablet 650 mg  650 mg Oral Once Linwood Dibbles, MD   650 mg at 11/04/14 2227  . [START ON 11/05/2014] aspirin suppository 300 mg  300 mg Rectal Daily Doree Albee, MD       Or  . Melene Muller ON 11/05/2014] aspirin tablet 325 mg  325 mg Oral Daily Doree Albee, MD      . enoxaparin (LOVENOX) injection 40 mg  40 mg Subcutaneous Q24H Doree Albee, MD      . Melene Muller ON 11/05/2014] insulin aspart (novoLOG) injection 0-9 Units  0-9 Units Subcutaneous 6 times per day Doree Albee, MD      . insulin aspart (novoLOG) injection 8 Units  8 Units Subcutaneous Once Linwood Dibbles, MD      . traMADol Janean Sark) tablet 50 mg  50 mg Oral Once Linwood Dibbles, MD       Current Outpatient Prescriptions  Medication Sig Dispense Refill  . cyclobenzaprine (FLEXERIL) 5 MG tablet Take 2 tablets (10 mg total) by mouth 3 (three) times daily as needed for muscle spasms. (Patient not taking: Reported on 11/04/2014) 30 tablet 0  . insulin aspart (NOVOLOG FLEXPEN) 100 UNIT/ML injection Inject 10 Units into the skin 3 (three) times daily before meals. Please give pen if covered, as well as 3 month supply. (Patient not taking: Reported on 11/04/2014) 1 vial 6  . insulin glargine (LANTUS SOLOSTAR) 100 UNIT/ML injection Inject 75 Units into the skin 2 (two) times daily. Please give 3 month supply. (Patient not taking: Reported on 11/04/2014) 1 pen 6  . Insulin Syringe-Needle U-100 31G X 5/16" 1 ML MISC 1 each by Does not apply route as directed. (Patient not taking: Reported on 11/04/2014) 100 each 6  . lisinopril (PRINIVIL,ZESTRIL) 10 MG tablet Take 2 tablets (20 mg total) by mouth daily. (Patient not taking: Reported on 11/04/2014) 180 tablet 1  . LORazepam (ATIVAN) 0.5 MG tablet Take 1 tablet (0.5 mg total) by mouth 2 (two)  times daily as needed for anxiety. (Patient not taking: Reported on 11/04/2014) 14 tablet 0  . meloxicam (MOBIC) 7.5 MG tablet Take 1 tablet (7.5 mg total) by mouth daily. (Patient not taking: Reported on 11/04/2014) 7 tablet 0  . oxyCODONE-acetaminophen (PERCOCET/ROXICET) 5-325 MG per tablet Take 2 tablets by mouth every 4 (four) hours as needed for pain. (Patient not taking: Reported on 11/04/2014) 30 tablet 0  . predniSONE (DELTASONE) 10 MG tablet Take 2 tablets (20 mg total) by mouth 2 (two) times daily. (Patient not taking: Reported on 11/04/2014) 20 tablet 0  . traMADol (ULTRAM) 50 MG tablet Take 1 tablet (50 mg total) by mouth every 8 (eight) hours as needed for pain. (Patient not taking: Reported on 11/04/2014) 30 tablet 0   Review Of Systems: 12 point ROS negative except as noted above  in HPI.  Physical Exam: Filed Vitals:   11/04/14 2155  BP: 126/81  Pulse: 90  Temp:   Resp: 20    General: alert and cooperative, minimal to mild R sided facial droop HEENT: PERRLA and extra ocular movement intact Heart: S1, S2 normal, no murmur, rub or gallop, regular rate and rhythm Lungs: clear to auscultation, no wheezes or rales and unlabored breathing Abdomen: abdomen is soft without significant tenderness, masses, organomegaly or guarding Extremities: 3/5 strength vs. 5/5 strength on L  Skin:no rashes Neurology: decreased sensation and strength on R, trace R sided facial droop, otherwise grossly normal exam   Labs and Imaging: Lab Results  Component Value Date/Time   NA 135 11/04/2014 09:12 PM   K 4.2 11/04/2014 09:12 PM   CL 101 11/04/2014 09:12 PM   CO2 23 11/04/2014 09:00 PM   BUN 20 11/04/2014 09:12 PM   CREATININE 1.00 11/04/2014 09:12 PM   CREATININE 0.88 11/04/2011 10:57 AM   GLUCOSE 442* 11/04/2014 09:12 PM   Lab Results  Component Value Date   WBC 8.5 11/04/2014   HGB 15.6* 11/04/2014   HCT 46.0 11/04/2014   MCV 86.0 11/04/2014   PLT 244 11/04/2014   Urinalysis       Ct Head Wo Contrast  11/04/2014   CLINICAL DATA:  Headache for 5 days. Right-sided numbness and weakness.  EXAM: CT HEAD WITHOUT CONTRAST  TECHNIQUE: Contiguous axial images were obtained from the base of the skull through the vertex without intravenous contrast.  COMPARISON:  CT scan of Jan 11, 2010.  FINDINGS: Bony calvarium appears intact. Mild chronic ischemic white matter disease is noted. Old cystic infarction is noted in left basal ganglia. No mass effect or midline shift is noted. Ventricular size is within normal limits. There is no evidence of mass lesion, hemorrhage or acute infarction.  IMPRESSION: Mild chronic ischemic white matter disease. Old lacunar infarction in left basal ganglia. No acute intracranial abnormality seen.   Electronically Signed   By: Lupita RaiderJames  Green Jr, M.D.   On: 11/04/2014 21:49           Doree AlbeeSteven Newton MD  Pager: [phone]

## 2014-11-04 NOTE — ED Notes (Addendum)
Rt side numbness since Friday, Headache. Nausea.  Walks with limp.  Says her speech is not normal

## 2014-11-05 ENCOUNTER — Observation Stay (HOSPITAL_COMMUNITY): Payer: Self-pay

## 2014-11-05 DIAGNOSIS — I63011 Cerebral infarction due to thrombosis of right vertebral artery: Secondary | ICD-10-CM

## 2014-11-05 LAB — URINALYSIS, ROUTINE W REFLEX MICROSCOPIC
BILIRUBIN URINE: NEGATIVE
Hgb urine dipstick: NEGATIVE
Ketones, ur: NEGATIVE mg/dL
Leukocytes, UA: NEGATIVE
Nitrite: NEGATIVE
PH: 5 (ref 5.0–8.0)
Protein, ur: 30 mg/dL — AB
Specific Gravity, Urine: >1.03 — ABNORMAL HIGH (ref 1.005–1.030)
Urobilinogen, UA: 0.2 mg/dL (ref 0.0–1.0)

## 2014-11-05 LAB — COMPREHENSIVE METABOLIC PANEL
ALK PHOS: 79 U/L (ref 39–117)
ALT: 11 U/L (ref 0–35)
ANION GAP: 10 (ref 5–15)
AST: 12 U/L (ref 0–37)
Albumin: 3.3 g/dL — ABNORMAL LOW (ref 3.5–5.2)
BUN: 21 mg/dL (ref 6–23)
CHLORIDE: 106 mmol/L (ref 96–112)
CO2: 25 mmol/L (ref 19–32)
Calcium: 9.1 mg/dL (ref 8.4–10.5)
Creatinine, Ser: 1.16 mg/dL — ABNORMAL HIGH (ref 0.50–1.10)
GFR, EST AFRICAN AMERICAN: 61 mL/min — AB (ref >90)
GFR, EST NON AFRICAN AMERICAN: 53 mL/min — AB (ref >90)
GLUCOSE: 135 mg/dL — AB (ref 70–99)
POTASSIUM: 3.8 mmol/L (ref 3.5–5.1)
Sodium: 141 mmol/L (ref 135–145)
Total Bilirubin: 0.5 mg/dL (ref 0.3–1.2)
Total Protein: 6.4 g/dL (ref 6.0–8.3)

## 2014-11-05 LAB — GLUCOSE, CAPILLARY
GLUCOSE-CAPILLARY: 124 mg/dL — AB (ref 70–99)
Glucose-Capillary: 163 mg/dL — ABNORMAL HIGH (ref 70–99)
Glucose-Capillary: 136 mg/dL — ABNORMAL HIGH (ref 70–99)
Glucose-Capillary: 355 mg/dL — ABNORMAL HIGH (ref 70–99)
Glucose-Capillary: 335 mg/dL — ABNORMAL HIGH (ref 70–99)

## 2014-11-05 LAB — URINE MICROSCOPIC-ADD ON

## 2014-11-05 LAB — CBC WITH DIFFERENTIAL/PLATELET
BASOS PCT: 0 % (ref 0–1)
Basophils Absolute: 0 10*3/uL (ref 0.0–0.1)
Eosinophils Absolute: 0.2 10*3/uL (ref 0.0–0.7)
Eosinophils Relative: 2 % (ref 0–5)
HEMATOCRIT: 41.1 % (ref 36.0–46.0)
Hemoglobin: 13.6 g/dL (ref 12.0–15.0)
Lymphocytes Relative: 32 % (ref 12–46)
Lymphs Abs: 2.6 10*3/uL (ref 0.7–4.0)
MCH: 28.6 pg (ref 26.0–34.0)
MCHC: 33.1 g/dL (ref 30.0–36.0)
MCV: 86.3 fL (ref 78.0–100.0)
MONO ABS: 0.5 10*3/uL (ref 0.1–1.0)
Monocytes Relative: 6 % (ref 3–12)
Neutro Abs: 4.8 10*3/uL (ref 1.7–7.7)
Neutrophils Relative %: 60 % (ref 43–77)
Platelets: 255 10*3/uL (ref 150–400)
RBC: 4.76 MIL/uL (ref 3.87–5.11)
RDW: 12.9 % (ref 11.5–15.5)
WBC: 8 10*3/uL (ref 4.0–10.5)

## 2014-11-05 LAB — TSH: TSH: 1.243 u[IU]/mL (ref 0.350–4.500)

## 2014-11-05 LAB — RAPID URINE DRUG SCREEN, HOSP PERFORMED
AMPHETAMINES: NOT DETECTED
BENZODIAZEPINES: NOT DETECTED
Barbiturates: NOT DETECTED
COCAINE: POSITIVE — AB
Opiates: POSITIVE — AB
Tetrahydrocannabinol: NOT DETECTED

## 2014-11-05 LAB — LIPID PANEL
Cholesterol: 262 mg/dL — ABNORMAL HIGH (ref 0–200)
HDL: 52 mg/dL (ref >39)
LDL Cholesterol: 179 mg/dL — ABNORMAL HIGH (ref 0–99)
TRIGLYCERIDES: 154 mg/dL — AB (ref <150)
Total CHOL/HDL Ratio: 5 RATIO
VLDL: 31 mg/dL (ref 0–40)

## 2014-11-05 MED ORDER — ZOLPIDEM TARTRATE 5 MG PO TABS
5.0000 mg | ORAL_TABLET | Freq: Every evening | ORAL | Status: DC | PRN
  Administered 2014-11-05 – 2014-11-06 (×2): 5 mg via ORAL
  Filled 2014-11-05 (×2): qty 1

## 2014-11-05 MED ORDER — LORAZEPAM 2 MG/ML IJ SOLN
1.0000 mg | Freq: Once | INTRAMUSCULAR | Status: AC
  Administered 2014-11-05: 1 mg via INTRAMUSCULAR
  Filled 2014-11-05: qty 1

## 2014-11-05 NOTE — Progress Notes (Signed)
Notified MD of patients MRI brain results.

## 2014-11-05 NOTE — Progress Notes (Signed)
UR completed 

## 2014-11-05 NOTE — Progress Notes (Signed)
Patient received smoking cessation handout and was educated about the importance of quitting smoking at this time. Patient stated she did not have any questions.
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
## 2014-11-05 NOTE — Evaluation (Signed)
Occupational Therapy Evaluation Patient Details Name: [name] MRN: [id number] DOB: 1961/03/24 Today's Date: 11/05/2014    History of Present Illness History of Present Illness:This is a 54 y.o. year old female with significant past medical history of IDDM, HTN, tobacco abuse, CAD, CVA presenting with CVA. Patient reports right-sided weakness over the past 4-5 days. Patient initially thought symptoms were going to self resolve however only persisted. Reports remote history of massive stroke proximal be 7-8 years ago. Denies any lingering deficits from this. Does not take aspirin daily. Still smokes daily. Intermittently compliant with her insulin regimen. Has had some intermittent nausea and abdominal pain. Otherwise asymptomatic.   Clinical Impression   Patient will benefit from OT services to increase RUE strength and fine/gross motor coordination to increase functional performance during ADL tasks. Patient would benefit from continued OT services at discharge although she does not have insurance and is unable to self pay. Patient was given HEP including AROM exercises and fine motor coordination tasks to complete independently at home. OT will see patient while she is in the hospital for treatment.     Follow Up Recommendations  No OT follow up (Due to no insurance.)    Equipment Recommendations  None recommended by OT    Recommendations for Other Services PT consult;Speech consult     Precautions / Restrictions Precautions Precautions: Fall Restrictions Weight Bearing Restrictions: No      Mobility Bed Mobility Overal bed mobility: Independent                Transfers Overall transfer level: Needs assistance Equipment used: None Transfers: Sit to/from [employer] Transfers Sit to Stand: Supervision Stand pivot transfers: Supervision       General transfer comment: Transfer supervision for safety.          ADL Overall ADL's : At baseline                                       General ADL Comments: Patient was able to transfer from bed to recliner with Supervision. Patient donned/doffed socks independently.                Pertinent Vitals/Pain Pain Assessment: 0-10 Pain Score: 9  Pain Location: Headache and right leg Pain Descriptors / Indicators: Aching Pain Intervention(s): Other (comment) (pt states that pain meds do not help. )     Hand Dominance Right   Extremity/Trunk Assessment Upper Extremity Assessment Upper Extremity Assessment: RUE deficits/detail RUE Deficits / Details: MMT: 3/5 shoulder, elbow, wrist ranges. AROM WFL in all ranges. Impaired gross grasp. Fine motor coordination impaired. Slow motor planning.  RUE Coordination: decreased fine motor;decreased gross motor   Lower Extremity Assessment Lower Extremity Assessment: Defer to PT evaluation       Communication Communication Communication: No difficulties   Cognition Arousal/Alertness: Awake/alert Behavior During Therapy: WFL for tasks assessed/performed Overall Cognitive Status: Within Functional Limits for tasks assessed                        Exercises   Other Exercises Other Exercises: Provided and educated patient on HEP for AROM BUE and fine motor coordination activities. Therapist provided demonstration. Patient verbalized understanding.  Other Exercises: Education given to patient on risk factors of stroke. Pt verbalized understanding.         Home Living Family/patient expects to be discharged to:: Private residence  Living Arrangements: Alone Available Help at Discharge: Neighbor;Available PRN/intermittently Type of Home: Apartment Home Access: Stairs to enter Entrance Stairs-Number of Steps: 17  Entrance Stairs-Rails: Left;Right;Can reach both Home Layout: One level     Bathroom Shower/Tub: Tub/shower unit         Home Equipment: None          Prior Functioning/Environment Level of  Independence: Independent             OT Diagnosis: Generalized weakness;Ataxia   OT Problem List: Decreased strength;Impaired UE functional use;Decreased coordination   OT Treatment/Interventions: Self-care/ADL training;Therapeutic exercise;Therapeutic activities;Neuromuscular education;Patient/family education;DME and/or AE instruction    OT Goals(Current goals can be found in the care plan section) Acute Rehab OT Goals Patient Stated Goal: To go home OT Goal Formulation: With patient Time For Goal Achievement: 11/19/14 Potential to Achieve Goals: Good  OT Frequency: Min 2X/week    End of Session Nurse Communication: Mobility status  Activity Tolerance: Patient tolerated treatment well Patient left: in chair;with call bell/phone within reach   Time: 0824-0900 OT Time Calculation (min): 36 min Charges:  OT General Charges $OT Visit: 1 Procedure OT Evaluation $Initial OT Evaluation Tier I: 1 Procedure OT Treatments $Self Care/Home Management : 8-22 mins G-Codes: OT G-codes **NOT FOR INPATIENT CLASS** Functional Assessment Tool Used: Clinical observation/judgement Functional Limitation: Carrying, moving and handling objects Carrying, Moving and Handling Objects Current Status (Z6109): At least 40 percent but less than 60 percent impaired, limited or restricted Carrying, Moving and Handling Objects Goal Status [phone]): At least 20 percent but less than 40 percent impaired, limited or restricted  Limmie Patricia, OTR/L,CBIS  [phone]  11/05/2014, 9:15 AM

## 2014-11-05 NOTE — Progress Notes (Signed)
*  PRELIMINARY RESULTS* Echocardiogram 2D Echocardiogram has been performed.  [name], [name] 11/05/2014, 2:48 PM

## 2014-11-05 NOTE — Care Management Note (Signed)
    Page 1 of 1   11/05/2014     4:12:58 PM CARE MANAGEMENT NOTE 11/05/2014  Patient:  [name],[name]   Account Number:  [id number]  Date Initiated:  11/05/2014  Documentation initiated by:  Sharrie RothmanBLACKWELL,Nesta Kimple C  Subjective/Objective Assessment:   Pt admitted from home with CVA. Pt lives alone and will return home at discharge. Pt works odd jobs to pay rent. Pt cannot afford medications or go to MD.     Action/Plan:   [facility] Pt aware of appt and documented on AVS. Pt also given information on medication assistance at [facility] and Blima SingerSonja Gunn phone number at health dept. C[facility] info   Anticipated DC Date:  11/06/2014   Anticipated DC Plan:  HOME/SELF CARE      DC Planning Services  CM consult      PAC Choice  DURABLE MEDICAL EQUIPMENT   Choice offered to / List presented to:  C-1 Patient   DME arranged  CANE      DME agency  [facility]        Status of service:  Completed, signed off Medicare Important Message given?   (If response is "NO", the following Medicare IM given date fields will be blank) Date Medicare IM given:   Medicare IM given by:   Date Additional Medicare IM given:   Additional Medicare IM given by:    Discharge Disposition:  HOME/SELF CARE  Per UR Regulation:    If discussed at Long Length of Stay Meetings, dates discussed:    Comments:  11/05/14 1545 Andrea Queenammy Ashawnti Tangen, RN BSN CM given to pt as well. No new medications prescribed at this time. Pt and pts nurse aware of discharge arrangements.

## 2014-11-05 NOTE — Progress Notes (Signed)
[facility] PROGRESS NOTE  [name] ZOX:[id number] DOB: 07/30/1961 DOA: 11/04/2014 PCP: Yolande JollyMelancon, Caleb G, MD  Assessment/Plan: Acute Left Thalamic CVA -Suspect related to cocaine use. -Also some left ICA disease on MRI. -Neurology following. -Start statin for LDL >150. -ASA 325 mg. -PT/OT evals. -ECHO pending. -Carotid Dopplers: Patent left carotid arterial stent  Mild stenosis within the right internal carotid artery in the 50-69% range.  HTN -Well controlled  Tobacco Abuse -Counseled on cessation.  PAD -s/p left CEA and right leg stenting.  Code Status: Full Code Family Communication: patient only  Disposition Plan: home once studies complete 24-48 hours   Consultants:  Neurology   Antibiotics:  None   Subjective: Denies cocaine use says someone must have put it in her drink.  Objective: Filed Vitals:   11/04/14 2336 11/05/14 0208 11/05/14 0438 11/05/14 1413  BP: 150/108 95/60 94/60 100/64  Pulse: 91 82 79 73  Temp: 98.8 F (37.1 C) 98.9 F (37.2 C) 98.7 F (37.1 C) 98.7 F (37.1 C)  TempSrc: Oral Oral Oral Oral  Resp:  18 20 20   Height: 5\' 5"  (1.651 m)     Weight: 67.45 kg (148 lb 11.2 oz)     SpO2: 100% 99% 94% 98%    Intake/Output Summary (Last 24 hours) at 11/05/14 1700 Last data filed at 11/05/14 1200  Gross per 24 hour  Intake    120 ml  Output    300 ml  Net   -180 ml   Filed Weights   11/04/14 2012 11/04/14 2336  Weight: 65.772 kg (145 lb) 67.45 kg (148 lb 11.2 oz)    Exam:   General:  AA Ox3  Cardiovascular: RRR  Respiratory: CTA B  Abdomen: S/NT/ND/+BS  Extremities: no C/C/E   Neurologic:  Non-focal  Data Reviewed: Basic Metabolic Panel:  Recent Labs Lab 11/04/14 2100 11/04/14 2112 11/05/14 0658  NA 133* 135 141  K 4.2 4.2 3.8  CL 105 101 106  CO2 23  --  25  GLUCOSE 431* 442* 135*  BUN 20 20 21   CREATININE 1.06 1.00 1.16*  CALCIUM 8.8  --  9.1   Liver Function Tests:  Recent  Labs Lab 11/04/14 2100 11/05/14 0658  AST 15 12  ALT 13 11  ALKPHOS 88 79  BILITOT 0.3 0.5  PROT 7.1 6.4  ALBUMIN 3.9 3.3*   No results for input(s): LIPASE, AMYLASE in the last 168 hours. No results for input(s): AMMONIA in the last 168 hours. CBC:  Recent Labs Lab 11/04/14 2100 11/04/14 2112 11/05/14 0658  WBC 8.5  --  8.0  NEUTROABS 6.2  --  4.8  HGB 14.1 15.6* 13.6  HCT 41.9 46.0 41.1  MCV 86.0  --  86.3  PLT 244  --  255   Cardiac Enzymes: No results for input(s): CKTOTAL, CKMB, CKMBINDEX, TROPONINI in the last 168 hours. BNP (last 3 results) No results for input(s): BNP in the last 8760 hours.  ProBNP (last 3 results) No results for input(s): PROBNP in the last 8760 hours.  CBG:  Recent Labs Lab 11/05/14 11/05/14 0443 11/05/14 0737 11/05/14 1158 11/05/14 1651  GLUCAP 344* 163* 136* 355* 124*    No results found for this or any previous visit (from the past 240 hour(s)).   Studies: Dg Chest 2 View  11/04/2014   CLINICAL DATA:  CVA.  Right-sided weakness and headaches.  EXAM: CHEST  2 VIEW  COMPARISON:  Chest radiograph 04/29/2012, thoracic spine 04/19/2013  FINDINGS: Patient is post median sternotomy and CABG. The lungs are hyperinflated with coarse interstitial markings. The cardiomediastinal contours are normal. Coronary artery calcification versus cardiac stent. Pulmonary vasculature is normal. No consolidation, pleural effusion, or pneumothorax. No acute osseous abnormalities are seen. There is stable anterior compression deformity of T11.  IMPRESSION: No acute pulmonary process. Post median sternotomy with hyperinflation, stable from prior.   Electronically Signed   By: Rubye Oaks M.D.   On: 11/04/2014 23:28   Ct Head Wo Contrast  11/04/2014   CLINICAL DATA:  Headache for 5 days. Right-sided numbness and weakness.  EXAM: CT HEAD WITHOUT CONTRAST  TECHNIQUE: Contiguous axial images were obtained from the base of the skull through the vertex without  intravenous contrast.  COMPARISON:  CT scan of Jan 11, 2010.  FINDINGS: Bony calvarium appears intact. Mild chronic ischemic white matter disease is noted. Old cystic infarction is noted in left basal ganglia. No mass effect or midline shift is noted. Ventricular size is within normal limits. There is no evidence of mass lesion, hemorrhage or acute infarction.  IMPRESSION: Mild chronic ischemic white matter disease. Old lacunar infarction in left basal ganglia. No acute intracranial abnormality seen.   Electronically Signed   By: Lupita Raider, M.D.   On: 11/04/2014 21:49   Mr Maxine Glenn Head Wo Contrast  11/05/2014   CLINICAL DATA:  Headache.  Right-sided weakness.  Tongue numbness.  EXAM: MRA HEAD WITHOUT CONTRAST  TECHNIQUE: Angiographic images of the Circle of Willis were obtained using MRA technique without intravenous contrast.  COMPARISON:  MRI brain from the same day.  FINDINGS: A moderate to high-grade stenosis is present in the precavernous left internal carotid artery. There is additional irregularity at the anterior genu of the cavernous segment on the left. There is no signal on the left A1 segment. Atherosclerotic irregularity is present in the cavernous right internal carotid artery without significant stenosis. The right A1 and bilateral M1 segments are normal. The MCA bifurcations are intact. There is mild attenuation distal MCA branch vessels, worse on the left.  The left vertebral artery is slightly dominant to the right. The right PICA origin is visualized and normal. There is a moderate stenosis of the proximal left vertebral artery. Basilar artery is within normal limits. The left posterior cerebral artery originates from the basilar tip. There is equal contribution to the right posterior cerebral artery by a posterior communicating artery and the right P1 segment. A moderate stenosis is present in the distal right P2 segment. There is attenuation of distal PCA branch vessels bilaterally.   IMPRESSION: 1. High-grade stenosis of the precavernous left internal carotid artery with asymmetric decreased flow in the distal left MCA branch vessels. 2. Additional atherosclerotic irregularity within the cavernous internal carotid arteries bilaterally without other significant tandem stenoses. 3. Signal loss in the left A1 segment. This may be a hypoplastic or stenosis vessel. 4. Moderate stenosis in the distal right P2 segment with distal PCA branch vessel attenuation bilaterally.   Electronically Signed   By: Marin Roberts M.D.   On: 11/05/2014 13:54   Mr Brain Wo Contrast  11/05/2014   CLINICAL DATA:  Right-sided weakness.  Tongue numbness.  EXAM: MRI HEAD WITHOUT CONTRAST  TECHNIQUE: Multiplanar, multiecho pulse sequences of the brain and surrounding structures were obtained without intravenous contrast.  COMPARISON:  CT head without contrast 11/04/2014.  FINDINGS: The diffusion-weighted images demonstrate an acute nonhemorrhagic infarct involving the lateral aspect of the left thalamus. Lesion measures 9 mm.  There is associated T2 change.  A more remote left posterior lentiform nucleus infarct extends superiorly into the coronal radiata. Bilateral subcortical white matter changes and mild periventricular changes are noted as well.  A remote lacunar infarct is present adjacent to the atrium of the right lateral ventricle.  Flow is present in the major intracranial arteries. The globes and orbits are intact. The paranasal sinuses and the mastoid air cells are clear.  IMPRESSION: 1. Acute nonhemorrhagic 9 mm infarct involving the left thalamus. 2. Remote infarct of the left lentiform nucleus extends to the coronal radiata. 3. Other chronic white matter changes are evident bilaterally. These results will be called to the ordering clinician or representative by the Radiologist Assistant, and communication documented in the PACS or zVision Dashboard.   Electronically Signed   By: Marin Roberts  M.D.   On: 11/05/2014 14:27   US Carotid Bilateral  11/05/2014   CLINICAL DATA:  History of left carotid artery stenosis and prior right endarterectomy  EXAM: BILATERAL CAROTID DUPLEX ULTRASOUND  TECHNIQUE: Wallace Cullens scale imaging, color Doppler and duplex ultrasound were performed of bilateral carotid and vertebral arteries in the neck.  COMPARISON:  None.  FINDINGS: Criteria: Quantification of carotid stenosis is based on velocity parameters that correlate the residual internal carotid diameter with NASCET-based stenosis levels, using the diameter of the distal internal carotid lumen as the denominator for stenosis measurement.  The following velocity measurements were obtained:  RIGHT  ICA:  142/39 cm/sec  CCA:  36/12 cm/sec  SYSTOLIC ICA/CCA RATIO:  3.9  DIASTOLIC ICA/CCA RATIO:  3.2  ECA:  85 cm/sec  LEFT  ICA:  102/38 cm/sec  CCA:  48/18 cm/sec  SYSTOLIC ICA/CCA RATIO:  2.1  DIASTOLIC ICA/CCA RATIO:  2.1  ECA:  53 cm/sec  RIGHT CAROTID ARTERY: The grayscale images demonstrate diffuse intimal thickening throughout the common carotid artery. Atherosclerotic plaque is noted in the distal common carotid artery and extending into the carotid bulb and proximal internal carotid artery. The waveforms, velocities and flow velocity ratios suggest a stenosis in the 50-69% but in the lower end of that spectrum.  RIGHT VERTEBRAL ARTERY:  Antegrade in nature  LEFT CAROTID ARTERY: There are changes consistent with a left carotid artery stent extending from the common carotid artery into the internal carotid artery. The waveforms, velocities and flow velocity ratios show no evidence of focal hemodynamically significant stenosis. The stent appears patent.  LEFT VERTEBRAL ARTERY:  Antegrade in nature.  IMPRESSION: Patent left carotid arterial stent  Mild stenosis within the right internal carotid artery in the 50-69% range.   Electronically Signed   By: Alcide Clever M.D.   On: 11/05/2014 14:19    Scheduled Meds: .  stroke:  mapping our early stages of recovery book   Does not apply Once  . acetaminophen  650 mg Oral Once  . aspirin  300 mg Rectal Daily   Or  . aspirin  325 mg Oral Daily  . enoxaparin (LOVENOX) injection  40 mg Subcutaneous Q24H  . insulin aspart  0-9 Units Subcutaneous 6 times per day  . insulin glargine  30 Units Subcutaneous BID  . traMADol  50 mg Oral Once   Continuous Infusions:   Active Problems:   CVA (cerebral infarction)    Time spent: 35 minutes. Greater than 50% of this time was spent in direct contact with the patient coordinating care.    Chaya Jan  [facility] Pager [phone]  If 7PM-7AM, please contact night-coverage at  [email or web address], password [facility] 11/05/2014, 5:00 PM  LOS: 0 days

## 2014-11-05 NOTE — Progress Notes (Signed)
SLP Cancellation Note  Patient Details Name: Vena RuaDarlene T Xiong MRN: [id number] DOB: 11/04/1960   Cancelled treatment:       Reason Eval/Treat Not Completed: Patient at procedure or test/unavailable; Pt currently off floor for tests. She passed RN swallow screen.     Thank you,  Havery MorosDabney Breella Vanostrand, CCC-SLP [phone]  Bryley Kovacevic 11/05/2014, 12:46 PM

## 2014-11-05 NOTE — Evaluation (Signed)
Physical Therapy Evaluation Patient Details Name: [name] MRN: [id number] DOB: 11-09-1960 Today's Date: 11/05/2014   History of Present Illness  History of Present Illness:This is a 54 y.o. year old female with significant past medical history of IDDM, HTN, tobacco abuse, CAD, CVA presenting with CVA. Patient reports right-sided weakness over the past 4-5 days. Patient initially thought symptoms were going to self resolve however only persisted. Reports remote history of massive stroke proximal be 7-8 years ago. Denies any lingering deficits from this. Does not take aspirin daily. Still smokes daily. Intermittently compliant with her insulin regimen. Has had some intermittent nausea and abdominal pain. Otherwise asymptomatic.  Clinical Impression  Pt states she has a headache and Rt leg pain.  Pt refuses to gait train with cane or walker, refuses exercises and refuses attempting stair climbing,(pt lives in second floor apartment), due to pain.      Follow Up Recommendations  Therapist assisted pt to bathroom gt is not safe,( pt reaches for walls for balance ), without assistive device but therapist unable to determine what assistive device pt will benefit from due to pt refusing to work with pt.  Therapist assisted pt back to bed and pt was asleep prior to therapist finishing this note despite 9/10 pain.     Equipment Recommendations   will need cane or walker       Precautions / Restrictions Precautions Precautions: Fall Restrictions Weight Bearing Restrictions: No      Mobility  Bed Mobility Overal bed mobility: Independent                Transfers Overall transfer level: Needs assistance Equipment used: None Transfers: Sit to/from Stand Sit to Stand: Supervision Stand pivot transfers: Supervision       General transfer comment: Transfer supervision for safety.   Ambulation/Gait Ambulation/Gait assistance: Supervision Ambulation Distance (Feet): 20  Feet Assistive device: None Gait Pattern/deviations: Decreased step length - left;Decreased step length - right;Decreased stance time - right;Decreased stance time - left;Decreased dorsiflexion - right   Gait velocity interpretation: Below normal speed for age/gender General Gait Details: Pt refused to ambulate with therapist due to pain but needed to use the rest room.  Pt is not safe without an assistive device but until pt will let therapist assess gt therapist is unsure if pt is safe with cane or walker.  Pt refused to try steps as well.            Pertinent Vitals/Pain Pain Assessment: 0-10 Pain Score: 9  Pain Location: Headache and Rt leg Pain Descriptors / Indicators: Aching Pain Intervention(s): Patient requesting pain meds-RN notified    Home Living Family/patient expects to be discharged to:: Private residence Living Arrangements: Alone Available Help at Discharge: Neighbor;Available PRN/intermittently Type of Home: Apartment Home Access: Stairs to enter Entrance Stairs-Rails: Left;Right;Can reach both Entrance Stairs-Number of Steps: 17  Home Layout: One level Home Equipment: None      Prior Function Level of Independence: Independent               Hand Dominance   Dominant Hand: Right    Extremity/Trunk Assessment   Upper Extremity Assessment: RUE deficits/detail RUE Deficits / Details: MMT: 3/5 shoulder, elbow, wrist ranges. AROM WFL in all ranges. Impaired gross grasp. Fine motor coordination impaired. Slow motor planning.          Lower Extremity Assessment: RLE deficits/detail RLE Deficits / Details: hip and ankle mm generally 2/5; knee mm 3-/5  Communication   Communication: No difficulties  Cognition Arousal/Alertness: Awake/alert Behavior During Therapy: WFL for tasks assessed/performed Overall Cognitive Status: Within Functional Limits for tasks assessed                      General Comments      Exercises Other  Exercises Other Exercises: Provided and educated patient on HEP for AROM BUE and fine motor coordination activities. Therapist provided demonstration. Patient verbalized understanding.  Other Exercises: Education given to patient on risk factors of stroke. Pt verbalized understanding.       Assessment/Plan    PT Assessment Patient needs continued PT services  PT Diagnosis Difficulty walking;Hemiplegia dominant side   PT Problem List Decreased strength;Decreased balance;Decreased knowledge of use of DME;Pain  PT Treatment Interventions     PT Goals (Current goals can be found in the Care Plan section) Acute Rehab PT Goals Patient Stated Goal: To go home    Frequency Min 5X/week   Barriers to discharge Inaccessible home environment (assume pt will not be able to complete steps safely at this time.)      Co-evaluation               End of Session Equipment Utilized During Treatment: Gait belt Activity Tolerance: Patient limited by pain Patient left: in bed;with bed alarm set;with call bell/phone within reach           Time: 1100-1125 PT Time Calculation (min) (ACUTE ONLY): 25 min   Charges:   PT Evaluation $Initial PT Evaluation Tier I: 1 Procedure     PT G Codes:        [name],CINDY 24-Nov-2014, 11:25 AM

## 2014-11-05 NOTE — Progress Notes (Signed)
Pt c/o headache. Ultram ordered and offered to pt. Pt refusing Ultram stating that she wanted something to go in her IV. MD paged no new orders for pain med. Ultram and Tylenol offered to pt. Pt continues to refuse these meds.

## 2014-11-06 DIAGNOSIS — I63031 Cerebral infarction due to thrombosis of right carotid artery: Secondary | ICD-10-CM

## 2014-11-06 DIAGNOSIS — I10 Essential (primary) hypertension: Secondary | ICD-10-CM

## 2014-11-06 DIAGNOSIS — Z720 Tobacco use: Secondary | ICD-10-CM

## 2014-11-06 DIAGNOSIS — E785 Hyperlipidemia, unspecified: Secondary | ICD-10-CM

## 2014-11-06 LAB — COMPREHENSIVE METABOLIC PANEL
ALBUMIN: 3.1 g/dL — AB (ref 3.5–5.2)
ALT: 10 U/L (ref 0–35)
AST: 12 U/L (ref 0–37)
Alkaline Phosphatase: 73 U/L (ref 39–117)
Anion gap: 3 — ABNORMAL LOW (ref 5–15)
BUN: 27 mg/dL — ABNORMAL HIGH (ref 6–23)
CALCIUM: 8.5 mg/dL (ref 8.4–10.5)
CO2: 23 mmol/L (ref 19–32)
Chloride: 112 mmol/L (ref 96–112)
Creatinine, Ser: 1.02 mg/dL (ref 0.50–1.10)
GFR calc Af Amer: 71 mL/min — ABNORMAL LOW (ref >90)
GFR calc non Af Amer: 62 mL/min — ABNORMAL LOW (ref >90)
Glucose, Bld: 72 mg/dL (ref 70–99)
Potassium: 3.7 mmol/L (ref 3.5–5.1)
SODIUM: 138 mmol/L (ref 135–145)
TOTAL PROTEIN: 6.2 g/dL (ref 6.0–8.3)
Total Bilirubin: 0.3 mg/dL (ref 0.3–1.2)

## 2014-11-06 LAB — HEMOGLOBIN A1C
HEMOGLOBIN A1C: 13.7 % — AB (ref 4.8–5.6)
Mean Plasma Glucose: 346 mg/dL

## 2014-11-06 LAB — CBC WITH DIFFERENTIAL/PLATELET
Basophils Absolute: 0 10*3/uL (ref 0.0–0.1)
Basophils Relative: 0 % (ref 0–1)
Eosinophils Absolute: 0.2 10*3/uL (ref 0.0–0.7)
Eosinophils Relative: 2 % (ref 0–5)
HCT: 41.4 % (ref 36.0–46.0)
Hemoglobin: 13.5 g/dL (ref 12.0–15.0)
LYMPHS ABS: 3.3 10*3/uL (ref 0.7–4.0)
LYMPHS PCT: 35 % (ref 12–46)
MCH: 28.4 pg (ref 26.0–34.0)
MCHC: 32.6 g/dL (ref 30.0–36.0)
MCV: 87.2 fL (ref 78.0–100.0)
Monocytes Absolute: 0.6 10*3/uL (ref 0.1–1.0)
Monocytes Relative: 6 % (ref 3–12)
NEUTROS ABS: 5.4 10*3/uL (ref 1.7–7.7)
Neutrophils Relative %: 57 % (ref 43–77)
PLATELETS: 282 10*3/uL (ref 150–400)
RBC: 4.75 MIL/uL (ref 3.87–5.11)
RDW: 13 % (ref 11.5–15.5)
WBC: 9.4 10*3/uL (ref 4.0–10.5)

## 2014-11-06 LAB — GLUCOSE, CAPILLARY
Glucose-Capillary: 151 mg/dL — ABNORMAL HIGH (ref 70–99)
Glucose-Capillary: 227 mg/dL — ABNORMAL HIGH (ref 70–99)
Glucose-Capillary: 297 mg/dL — ABNORMAL HIGH (ref 70–99)
Glucose-Capillary: 81 mg/dL (ref 70–99)

## 2014-11-06 LAB — HIV ANTIBODY (ROUTINE TESTING W REFLEX): HIV SCREEN 4TH GENERATION: NONREACTIVE

## 2014-11-06 LAB — RPR: RPR: NONREACTIVE

## 2014-11-06 MED ORDER — ASPIRIN 325 MG PO TABS
325.0000 mg | ORAL_TABLET | Freq: Every day | ORAL | Status: AC
Start: 2014-11-06 — End: ?

## 2014-11-06 MED ORDER — INSULIN STARTER KIT- SYRINGES (ENGLISH)
1.0000 | Freq: Once | Status: DC
  Filled 2014-11-06: qty 1

## 2014-11-06 MED ORDER — INSULIN ASPART PROT & ASPART (70-30 MIX) 100 UNIT/ML PEN: 45.0000 [IU] | PEN_INJECTOR | Freq: Two times a day (BID) | SUBCUTANEOUS | Status: AC

## 2014-11-06 MED ORDER — PRAVASTATIN SODIUM 40 MG PO TABS: 40.0000 mg | ORAL_TABLET | Freq: Every day | ORAL | Status: AC
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
## 2014-11-06 NOTE — Progress Notes (Addendum)
Inpatient Diabetes Program Recommendations  AACE/ADA: New Consensus Statement on Inpatient Glycemic Control (2013)  Target Ranges:  Prepandial:   less than 140 mg/dL      Peak postprandial:   less than 180 mg/dL (1-2 hours)      Critically ill patients:  140 - 180 mg/dL   Results for [name], [name] (MRN [id number]) as of 11/06/2014 09:31  Ref. Range 11/05/2014 07:37 11/05/2014 11:58 11/05/2014 16:51 11/05/2014 21:12 11/06/2014 00:15 11/06/2014 03:57 11/06/2014 07:48  Glucose-Capillary Latest Range: 70-99 mg/dL 409136 (H) 811355 (H) 914124 (H) 335 (H) 297 (H) 151 (H) 81   Diabetes history: DM2 Outpatient Diabetes medications: Lantus 70 units BID, Novolog 10 units TID with meals Current orders for Inpatient glycemic control: Lantus 30 units BID, Novolog 0-9 units Q4H  Inpatient Diabetes Program Recommendations Basal: Please consider changing basal insulin to 70/30 due to cost. Recommend ordering 70/30 45 units BID (which would provide 63 units for basal and 27 units for meal coverage per day). Correction (SSI): Please consider changing CBGs and Novolog correction to ACHS. HgbA1C: A1C 13.7% on 11/05/14. Plan to talk with patient today regarding DM control.  Note:Spoke with patient about diabetes and home regimen for diabetes control. Patient reports that she does not have a PCP and is not currently followed for diabetes management. According to the patient she has "not taken any insulin in a year" because she can not afford to buy insulin. Patient states that when she was using insulin she was on Lantus and Novolog.  Inquired about knowledge about A1C and patient reports that she knows what an A1C is. Discussed A1C results (13.7% on 11/05/14) and explained what an A1C is, basic pathophysiology of DM Type 2, basic home care, importance of checking CBGs and maintaining good CBG control to prevent long-term and short-term complications. Discussed impact of nutrition, exercise, stress, sickness, and medications on  diabetes control.  Explained in detail how uncontrolled diabetes damages lining of blood vessels and allows plaques to build up and increase risk of blood clots which can lead to stroke and heart attack; also discussed other complications in detail related to uncontrolled diabetes. Patient states that she has a glucometer at home and she checks her glucose once a day around noon. Patient reports that her glucose was "running okay in low 100's up until about 1 week ago when it started staying up in the 300-400's mg/dl. Explained how A1C correlates with glucose in mid 300's mg/dl over the past 2-3 months. Stressed importance of getting diabetes controlled. Patient states that she will be going to the health department and to DSS once she is discharged to see if she can get Medicaid or any type of assistance. She reports that [facility]   Since patient does not have any insurance and is not able to afford Lantus and Novolog insulin; recommend switching her to 70/30 insulin. Informed patient she could purchase Novolin 70/30 [facility] for $25 per vial. Also informed patient she can purchase a Reli-On glucometer for about $15 and a box of 50 test strips for $9 at [facility] if she runs out of strips for her current meter.  Patient verbalized understanding of information discussed and she states that she has no further questions at this time related to diabetes. Discussed patient with Tammy, RN, CM.   Thanks, Orlando PennerMarie Nemesio Castrillon, RN, MSN, CCRN Diabetes Coordinator Inpatient Diabetes Program [phone] (Team Pager) [phone] ([place] office) [phone] [facility]  office)    Thanks, Orlando Penner, RN, MSN, CCRN, CDE Diabetes Coordinator Inpatient Diabetes Program [phone] (Team Pager) [phone] ([place] office) [phone] [facility] office)

## 2014-11-06 NOTE — Progress Notes (Signed)
Physical Therapy Treatment Patient Details Name: [name] MRN: [id number] DOB: 11/30/1960 Today's Date: 11/06/2014    History of Present Illness History of Present Illness:This is a 54 y.o. year old female with significant past medical history of IDDM, HTN, tobacco abuse, CAD, CVA presenting with CVA. Patient reports right-sided weakness over the past 4-5 days. Patient initially thought symptoms were going to self resolve however only persisted. Reports remote history of massive stroke proximal be 7-8 years ago. Denies any lingering deficits from this. Does not take aspirin daily. Still smokes daily. Intermittently compliant with her insulin regimen. Has had some intermittent nausea and abdominal pain. Otherwise asymptomatic.    PT Comments    Pt was seen for continuation of care.  She reports continued pain in the RLE, specifically in the calf and foot.  On inspection, there is mild edema and erythema in the right calf.  She has significant weakness in the anterior tibialis which causes a drop foot during gait.  She had received a cane from case management so pt reluctantly agreed to allow me to fit it to her and instruct her in its' use.  Her gait with a cane is stable.  She plans to stay with a friend in a 1st floor apartment so she refused to leave her room to practice stairs.  We discussed her high risk of stumbling due to her right drop foot and I urged her to ambulate slowly and focus on flexing her right hip and knee adequately during swing through phase of gait.  She appeared to understand.   Follow Up Recommendations  No PT follow up (pt has no insurance)     Equipment Recommendations    cane   Recommendations for Other Services  none     Precautions / Restrictions Precautions Precautions: Fall Restrictions Weight Bearing Restrictions: No    Mobility  Bed Mobility Overal bed mobility: Modified Independent                Transfers Overall transfer level:  Modified independent Equipment used: Straight cane Transfers: Sit to/from Stand Sit to Stand: Modified independent (Device/Increase time)            Ambulation/Gait Ambulation/Gait assistance: Supervision Ambulation Distance (Feet): 20 Feet Assistive device: Straight cane Gait Pattern/deviations: Decreased step length - right;Decreased stance time - right;Decreased dorsiflexion - right Gait velocity: appropriate for situation   General Gait Details: cane was adjusted for pt height and she was instructed in gait technique...ultimately, gait with cane was stable.  She refused to leave the room in order to practice steps but states that she will stay with a friend for awhile   Stairs            Wheelchair Mobility    Modified Rankin (Stroke Patients Only)       Balance Overall balance assessment: Modified Independent                                  Cognition Arousal/Alertness: Awake/alert Behavior During Therapy: WFL for tasks assessed/performed (pt reluctant to cooperate with PT but finally agreeable.) Overall Cognitive Status: Within Functional Limits for tasks assessed                      Exercises      General Comments        Pertinent Vitals/Pain Pain Score:  (pt unable to determine pain level) Pain Location:  headache and right leg Pain Descriptors / Indicators: Aching Pain Intervention(s): Limited activity within patient's tolerance;Monitored during session    Home Living                      Prior Function            PT Goals (current goals can now be found in the care plan section) Progress towards PT goals: Progressing toward goals    Frequency       PT Plan Current plan remains appropriate    Co-evaluation             End of Session Equipment Utilized During Treatment: Gait belt Activity Tolerance: Patient tolerated treatment well Patient left: in bed;with call bell/phone within reach      Time: 1040-1059 PT Time Calculation (min) (ACUTE ONLY): 19 min  Charges:  $Gait Training: 8-22 mins                    G Codes:      Myrlene Broker L 12-01-2014, 11:06 AM

## 2014-11-08 NOTE — Progress Notes (Signed)
Patient was discharged with instructions given on medications,and follow up visits,patient verbalized understanding. Prescriptions sent with patient.No c/o pain or discomfort noted. Accompanied by staff to an awaiting vehicle.

## 2014-11-10 LAB — HOMOCYSTEINE: HOMOCYSTEINE-NORM: 15.5 umol/L — AB (ref 0.0–15.0)
